# Patient Record
Sex: Female | Born: 1960
Health system: Southern US, Community
[De-identification: ages and names within clinical notes are randomized; demographics above are authoritative.]

## PROBLEM LIST (undated history)

## (undated) DIAGNOSIS — F419 Anxiety disorder, unspecified: Secondary | ICD-10-CM

## (undated) DIAGNOSIS — G8929 Other chronic pain: Secondary | ICD-10-CM

## (undated) DIAGNOSIS — E039 Hypothyroidism, unspecified: Secondary | ICD-10-CM

## (undated) DIAGNOSIS — K589 Irritable bowel syndrome without diarrhea: Secondary | ICD-10-CM

## (undated) DIAGNOSIS — A770 Spotted fever due to Rickettsia rickettsii: Secondary | ICD-10-CM

## (undated) DIAGNOSIS — E079 Disorder of thyroid, unspecified: Secondary | ICD-10-CM

## (undated) DIAGNOSIS — F329 Major depressive disorder, single episode, unspecified: Secondary | ICD-10-CM

## (undated) DIAGNOSIS — K649 Unspecified hemorrhoids: Secondary | ICD-10-CM

## (undated) DIAGNOSIS — E669 Obesity, unspecified: Secondary | ICD-10-CM

## (undated) DIAGNOSIS — M549 Dorsalgia, unspecified: Secondary | ICD-10-CM

## (undated) DIAGNOSIS — K5000 Crohn's disease of small intestine without complications: Secondary | ICD-10-CM

## (undated) DIAGNOSIS — M542 Cervicalgia: Secondary | ICD-10-CM

## (undated) DIAGNOSIS — K449 Diaphragmatic hernia without obstruction or gangrene: Secondary | ICD-10-CM

## (undated) DIAGNOSIS — K5792 Diverticulitis of intestine, part unspecified, without perforation or abscess without bleeding: Secondary | ICD-10-CM

## (undated) DIAGNOSIS — I10 Essential (primary) hypertension: Secondary | ICD-10-CM

## (undated) DIAGNOSIS — F32A Depression, unspecified: Secondary | ICD-10-CM

## (undated) DIAGNOSIS — K219 Gastro-esophageal reflux disease without esophagitis: Secondary | ICD-10-CM

## (undated) DIAGNOSIS — K635 Polyp of colon: Secondary | ICD-10-CM

## (undated) HISTORY — DX: Obesity, unspecified: E66.9

## (undated) HISTORY — PX: UMBILICAL HERNIA REPAIR: SHX196

## (undated) HISTORY — DX: Gastro-esophageal reflux disease without esophagitis: K21.9

## (undated) HISTORY — DX: Depression, unspecified: F32.A

## (undated) HISTORY — DX: Major depressive disorder, single episode, unspecified: F32.9

## (undated) HISTORY — DX: Anxiety disorder, unspecified: F41.9

## (undated) HISTORY — DX: Diaphragmatic hernia without obstruction or gangrene: K44.9

## (undated) HISTORY — DX: Crohn's disease of small intestine without complications: K50.00

## (undated) HISTORY — DX: Polyp of colon: K63.5

## (undated) HISTORY — DX: Diverticulitis of intestine, part unspecified, without perforation or abscess without bleeding: K57.92

## (undated) HISTORY — DX: Unspecified hemorrhoids: K64.9

## (undated) HISTORY — DX: Dorsalgia, unspecified: M54.9

## (undated) HISTORY — DX: Cervicalgia: M54.2

## (undated) HISTORY — PX: APPENDECTOMY: SHX54

## (undated) HISTORY — DX: Irritable bowel syndrome, unspecified: K58.9

## (undated) HISTORY — DX: Essential (primary) hypertension: I10

## (undated) HISTORY — DX: Other chronic pain: G89.29

---

## 2000-12-16 HISTORY — PX: OTHER SURGICAL HISTORY: SHX169

## 2001-09-18 ENCOUNTER — Encounter: Payer: Self-pay | Admitting: Internal Medicine

## 2001-09-18 ENCOUNTER — Ambulatory Visit (HOSPITAL_COMMUNITY): Admission: RE | Admit: 2001-09-18 | Discharge: 2001-09-18 | Payer: Self-pay | Admitting: Internal Medicine

## 2001-09-22 ENCOUNTER — Encounter: Payer: Self-pay | Admitting: Internal Medicine

## 2001-09-22 ENCOUNTER — Ambulatory Visit (HOSPITAL_COMMUNITY): Admission: RE | Admit: 2001-09-22 | Discharge: 2001-09-22 | Payer: Self-pay | Admitting: Internal Medicine

## 2001-09-25 ENCOUNTER — Ambulatory Visit (HOSPITAL_COMMUNITY): Admission: RE | Admit: 2001-09-25 | Discharge: 2001-09-25 | Payer: Self-pay | Admitting: Internal Medicine

## 2001-09-25 ENCOUNTER — Encounter: Payer: Self-pay | Admitting: Internal Medicine

## 2001-10-09 ENCOUNTER — Encounter: Payer: Self-pay | Admitting: Otolaryngology

## 2001-10-09 ENCOUNTER — Ambulatory Visit (HOSPITAL_COMMUNITY): Admission: RE | Admit: 2001-10-09 | Discharge: 2001-10-09 | Payer: Self-pay | Admitting: Otolaryngology

## 2001-10-28 ENCOUNTER — Encounter: Admission: RE | Admit: 2001-10-28 | Discharge: 2001-10-28 | Payer: Self-pay | Admitting: Otolaryngology

## 2001-10-28 ENCOUNTER — Encounter: Payer: Self-pay | Admitting: Otolaryngology

## 2001-11-02 ENCOUNTER — Ambulatory Visit (HOSPITAL_BASED_OUTPATIENT_CLINIC_OR_DEPARTMENT_OTHER): Admission: RE | Admit: 2001-11-02 | Discharge: 2001-11-02 | Payer: Self-pay | Admitting: Otolaryngology

## 2001-11-02 ENCOUNTER — Encounter (INDEPENDENT_AMBULATORY_CARE_PROVIDER_SITE_OTHER): Payer: Self-pay | Admitting: *Deleted

## 2001-12-28 ENCOUNTER — Ambulatory Visit (HOSPITAL_COMMUNITY): Admission: RE | Admit: 2001-12-28 | Discharge: 2001-12-28 | Payer: Self-pay | Admitting: Otolaryngology

## 2001-12-28 ENCOUNTER — Encounter: Payer: Self-pay | Admitting: Otolaryngology

## 2002-08-02 ENCOUNTER — Ambulatory Visit (HOSPITAL_COMMUNITY): Admission: RE | Admit: 2002-08-02 | Discharge: 2002-08-02 | Payer: Self-pay | Admitting: Family Medicine

## 2002-08-02 ENCOUNTER — Encounter: Payer: Self-pay | Admitting: Family Medicine

## 2002-12-16 HISTORY — PX: SUBTOTAL COLECTOMY: SHX855

## 2002-12-16 HISTORY — PX: LIVER BIOPSY: SHX301

## 2002-12-16 HISTORY — PX: CHOLECYSTECTOMY: SHX55

## 2003-04-11 ENCOUNTER — Ambulatory Visit (HOSPITAL_COMMUNITY): Admission: RE | Admit: 2003-04-11 | Discharge: 2003-04-11 | Payer: Self-pay | Admitting: General Surgery

## 2003-07-01 ENCOUNTER — Emergency Department (HOSPITAL_COMMUNITY): Admission: EM | Admit: 2003-07-01 | Discharge: 2003-07-01 | Payer: Self-pay | Admitting: Emergency Medicine

## 2003-07-01 ENCOUNTER — Encounter: Payer: Self-pay | Admitting: Emergency Medicine

## 2003-07-08 ENCOUNTER — Encounter (HOSPITAL_COMMUNITY): Admission: RE | Admit: 2003-07-08 | Discharge: 2003-08-07 | Payer: Self-pay | Admitting: Family Medicine

## 2003-07-08 ENCOUNTER — Encounter: Payer: Self-pay | Admitting: Family Medicine

## 2003-07-13 ENCOUNTER — Inpatient Hospital Stay (HOSPITAL_COMMUNITY): Admission: EM | Admit: 2003-07-13 | Discharge: 2003-08-02 | Payer: Self-pay | Admitting: Emergency Medicine

## 2003-07-13 ENCOUNTER — Encounter: Payer: Self-pay | Admitting: Emergency Medicine

## 2003-07-18 ENCOUNTER — Encounter: Payer: Self-pay | Admitting: General Surgery

## 2003-07-19 ENCOUNTER — Encounter: Payer: Self-pay | Admitting: General Surgery

## 2003-08-30 ENCOUNTER — Inpatient Hospital Stay (HOSPITAL_COMMUNITY): Admission: EM | Admit: 2003-08-30 | Discharge: 2003-09-23 | Payer: Self-pay | Admitting: Emergency Medicine

## 2003-08-30 ENCOUNTER — Encounter: Payer: Self-pay | Admitting: Emergency Medicine

## 2003-08-30 ENCOUNTER — Encounter: Payer: Self-pay | Admitting: General Surgery

## 2003-09-05 ENCOUNTER — Encounter: Payer: Self-pay | Admitting: General Surgery

## 2003-09-13 ENCOUNTER — Encounter: Payer: Self-pay | Admitting: General Surgery

## 2003-09-17 ENCOUNTER — Encounter: Payer: Self-pay | Admitting: General Surgery

## 2003-10-19 ENCOUNTER — Emergency Department (HOSPITAL_COMMUNITY): Admission: EM | Admit: 2003-10-19 | Discharge: 2003-10-19 | Payer: Self-pay | Admitting: Emergency Medicine

## 2003-10-24 ENCOUNTER — Ambulatory Visit (HOSPITAL_COMMUNITY): Admission: RE | Admit: 2003-10-24 | Discharge: 2003-10-24 | Payer: Self-pay | Admitting: General Surgery

## 2003-12-17 HISTORY — PX: COLONOSCOPY: SHX174

## 2004-01-03 ENCOUNTER — Ambulatory Visit (HOSPITAL_COMMUNITY): Admission: RE | Admit: 2004-01-03 | Discharge: 2004-01-03 | Payer: Self-pay | Admitting: General Surgery

## 2004-02-20 ENCOUNTER — Emergency Department (HOSPITAL_COMMUNITY): Admission: EM | Admit: 2004-02-20 | Discharge: 2004-02-20 | Payer: Self-pay | Admitting: Emergency Medicine

## 2004-02-29 ENCOUNTER — Inpatient Hospital Stay (HOSPITAL_COMMUNITY): Admission: AD | Admit: 2004-02-29 | Discharge: 2004-03-05 | Payer: Self-pay | Admitting: General Surgery

## 2004-07-26 ENCOUNTER — Ambulatory Visit (HOSPITAL_COMMUNITY): Admission: RE | Admit: 2004-07-26 | Discharge: 2004-07-26 | Payer: Self-pay | Admitting: General Surgery

## 2004-10-25 ENCOUNTER — Ambulatory Visit (HOSPITAL_COMMUNITY): Admission: RE | Admit: 2004-10-25 | Discharge: 2004-10-25 | Payer: Self-pay | Admitting: General Surgery

## 2004-11-15 ENCOUNTER — Ambulatory Visit (HOSPITAL_COMMUNITY): Admission: RE | Admit: 2004-11-15 | Discharge: 2004-11-15 | Payer: Self-pay | Admitting: General Surgery

## 2004-12-01 ENCOUNTER — Emergency Department (HOSPITAL_COMMUNITY): Admission: EM | Admit: 2004-12-01 | Discharge: 2004-12-01 | Payer: Self-pay | Admitting: Emergency Medicine

## 2004-12-02 ENCOUNTER — Emergency Department (HOSPITAL_COMMUNITY): Admission: EM | Admit: 2004-12-02 | Discharge: 2004-12-02 | Payer: Self-pay | Admitting: Emergency Medicine

## 2004-12-03 ENCOUNTER — Inpatient Hospital Stay (HOSPITAL_COMMUNITY): Admission: AD | Admit: 2004-12-03 | Discharge: 2004-12-06 | Payer: Self-pay | Admitting: General Surgery

## 2005-01-25 ENCOUNTER — Ambulatory Visit: Payer: Self-pay | Admitting: Psychiatry

## 2005-01-31 ENCOUNTER — Emergency Department (HOSPITAL_COMMUNITY): Admission: EM | Admit: 2005-01-31 | Discharge: 2005-01-31 | Payer: Self-pay | Admitting: Emergency Medicine

## 2005-02-15 ENCOUNTER — Ambulatory Visit (HOSPITAL_COMMUNITY): Admission: RE | Admit: 2005-02-15 | Discharge: 2005-02-15 | Payer: Self-pay | Admitting: General Surgery

## 2005-03-15 ENCOUNTER — Ambulatory Visit: Payer: Self-pay | Admitting: Psychiatry

## 2005-05-02 ENCOUNTER — Ambulatory Visit: Payer: Self-pay | Admitting: Psychiatry

## 2005-05-14 ENCOUNTER — Ambulatory Visit (HOSPITAL_COMMUNITY): Admission: RE | Admit: 2005-05-14 | Discharge: 2005-05-14 | Payer: Self-pay | Admitting: General Surgery

## 2005-06-03 ENCOUNTER — Emergency Department (HOSPITAL_COMMUNITY): Admission: EM | Admit: 2005-06-03 | Discharge: 2005-06-03 | Payer: Self-pay | Admitting: Emergency Medicine

## 2005-06-10 ENCOUNTER — Inpatient Hospital Stay (HOSPITAL_COMMUNITY): Admission: AD | Admit: 2005-06-10 | Discharge: 2005-06-13 | Payer: Self-pay | Admitting: General Surgery

## 2005-06-20 ENCOUNTER — Ambulatory Visit: Payer: Self-pay | Admitting: Psychiatry

## 2005-08-09 ENCOUNTER — Ambulatory Visit: Payer: Self-pay | Admitting: Psychiatry

## 2005-08-14 ENCOUNTER — Emergency Department (HOSPITAL_COMMUNITY): Admission: EM | Admit: 2005-08-14 | Discharge: 2005-08-14 | Payer: Self-pay | Admitting: Emergency Medicine

## 2005-08-18 ENCOUNTER — Emergency Department (HOSPITAL_COMMUNITY): Admission: EM | Admit: 2005-08-18 | Discharge: 2005-08-18 | Payer: Self-pay | Admitting: Emergency Medicine

## 2005-09-17 ENCOUNTER — Emergency Department (HOSPITAL_COMMUNITY): Admission: EM | Admit: 2005-09-17 | Discharge: 2005-09-18 | Payer: Self-pay | Admitting: Emergency Medicine

## 2005-09-19 ENCOUNTER — Ambulatory Visit (HOSPITAL_COMMUNITY): Admission: RE | Admit: 2005-09-19 | Discharge: 2005-09-19 | Payer: Self-pay | Admitting: Family Medicine

## 2005-10-21 ENCOUNTER — Observation Stay (HOSPITAL_COMMUNITY): Admission: AD | Admit: 2005-10-21 | Discharge: 2005-10-30 | Payer: Self-pay | Admitting: General Surgery

## 2005-10-21 ENCOUNTER — Ambulatory Visit (HOSPITAL_COMMUNITY): Admission: RE | Admit: 2005-10-21 | Discharge: 2005-10-21 | Payer: Self-pay | Admitting: General Surgery

## 2005-12-03 ENCOUNTER — Emergency Department (HOSPITAL_COMMUNITY): Admission: EM | Admit: 2005-12-03 | Discharge: 2005-12-03 | Payer: Self-pay | Admitting: Emergency Medicine

## 2005-12-13 ENCOUNTER — Inpatient Hospital Stay (HOSPITAL_COMMUNITY): Admission: AD | Admit: 2005-12-13 | Discharge: 2005-12-21 | Payer: Self-pay | Admitting: General Surgery

## 2005-12-27 ENCOUNTER — Emergency Department (HOSPITAL_COMMUNITY): Admission: EM | Admit: 2005-12-27 | Discharge: 2005-12-27 | Payer: Self-pay | Admitting: Emergency Medicine

## 2006-01-15 ENCOUNTER — Ambulatory Visit (HOSPITAL_COMMUNITY): Admission: RE | Admit: 2006-01-15 | Discharge: 2006-01-15 | Payer: Self-pay | Admitting: Family Medicine

## 2006-01-22 ENCOUNTER — Emergency Department (HOSPITAL_COMMUNITY): Admission: EM | Admit: 2006-01-22 | Discharge: 2006-01-22 | Payer: Self-pay | Admitting: Emergency Medicine

## 2006-01-28 ENCOUNTER — Ambulatory Visit (HOSPITAL_COMMUNITY): Admission: RE | Admit: 2006-01-28 | Discharge: 2006-01-28 | Payer: Self-pay | Admitting: Family Medicine

## 2006-01-29 ENCOUNTER — Emergency Department (HOSPITAL_COMMUNITY): Admission: EM | Admit: 2006-01-29 | Discharge: 2006-01-29 | Payer: Self-pay | Admitting: Emergency Medicine

## 2006-01-30 ENCOUNTER — Emergency Department (HOSPITAL_COMMUNITY): Admission: EM | Admit: 2006-01-30 | Discharge: 2006-01-30 | Payer: Self-pay | Admitting: Emergency Medicine

## 2006-01-31 ENCOUNTER — Emergency Department (HOSPITAL_COMMUNITY): Admission: EM | Admit: 2006-01-31 | Discharge: 2006-01-31 | Payer: Self-pay | Admitting: Emergency Medicine

## 2006-02-02 ENCOUNTER — Emergency Department (HOSPITAL_COMMUNITY): Admission: EM | Admit: 2006-02-02 | Discharge: 2006-02-03 | Payer: Self-pay | Admitting: Emergency Medicine

## 2006-03-13 ENCOUNTER — Emergency Department (HOSPITAL_COMMUNITY): Admission: EM | Admit: 2006-03-13 | Discharge: 2006-03-13 | Payer: Self-pay | Admitting: Emergency Medicine

## 2006-04-09 ENCOUNTER — Inpatient Hospital Stay (HOSPITAL_COMMUNITY): Admission: AD | Admit: 2006-04-09 | Discharge: 2006-04-13 | Payer: Self-pay | Admitting: General Surgery

## 2006-04-16 ENCOUNTER — Emergency Department (HOSPITAL_COMMUNITY): Admission: EM | Admit: 2006-04-16 | Discharge: 2006-04-16 | Payer: Self-pay | Admitting: Emergency Medicine

## 2006-04-18 ENCOUNTER — Emergency Department (HOSPITAL_COMMUNITY): Admission: EM | Admit: 2006-04-18 | Discharge: 2006-04-18 | Payer: Self-pay | Admitting: Emergency Medicine

## 2006-04-19 ENCOUNTER — Observation Stay (HOSPITAL_COMMUNITY): Admission: AD | Admit: 2006-04-19 | Discharge: 2006-04-21 | Payer: Self-pay | Admitting: General Surgery

## 2006-05-14 ENCOUNTER — Emergency Department (HOSPITAL_COMMUNITY): Admission: EM | Admit: 2006-05-14 | Discharge: 2006-05-14 | Payer: Self-pay | Admitting: Emergency Medicine

## 2006-05-22 ENCOUNTER — Emergency Department (HOSPITAL_COMMUNITY): Admission: EM | Admit: 2006-05-22 | Discharge: 2006-05-22 | Payer: Self-pay | Admitting: Emergency Medicine

## 2006-05-29 ENCOUNTER — Ambulatory Visit: Payer: Self-pay | Admitting: Orthopedic Surgery

## 2006-06-23 ENCOUNTER — Emergency Department (HOSPITAL_COMMUNITY): Admission: EM | Admit: 2006-06-23 | Discharge: 2006-06-23 | Payer: Self-pay | Admitting: Emergency Medicine

## 2006-07-02 ENCOUNTER — Ambulatory Visit (HOSPITAL_COMMUNITY): Payer: Self-pay | Admitting: Psychiatry

## 2006-07-09 ENCOUNTER — Emergency Department (HOSPITAL_COMMUNITY): Admission: EM | Admit: 2006-07-09 | Discharge: 2006-07-09 | Payer: Self-pay | Admitting: Emergency Medicine

## 2006-07-15 ENCOUNTER — Ambulatory Visit (HOSPITAL_COMMUNITY): Payer: Self-pay | Admitting: Psychiatry

## 2006-07-18 ENCOUNTER — Emergency Department (HOSPITAL_COMMUNITY): Admission: EM | Admit: 2006-07-18 | Discharge: 2006-07-18 | Payer: Self-pay | Admitting: Emergency Medicine

## 2006-07-22 ENCOUNTER — Emergency Department (HOSPITAL_COMMUNITY): Admission: EM | Admit: 2006-07-22 | Discharge: 2006-07-22 | Payer: Self-pay | Admitting: Emergency Medicine

## 2006-07-24 ENCOUNTER — Emergency Department (HOSPITAL_COMMUNITY): Admission: EM | Admit: 2006-07-24 | Discharge: 2006-07-24 | Payer: Self-pay | Admitting: Emergency Medicine

## 2006-07-28 ENCOUNTER — Ambulatory Visit (HOSPITAL_COMMUNITY): Payer: Self-pay | Admitting: Psychiatry

## 2006-08-12 ENCOUNTER — Ambulatory Visit (HOSPITAL_COMMUNITY): Payer: Self-pay | Admitting: Psychiatry

## 2006-08-26 ENCOUNTER — Ambulatory Visit (HOSPITAL_COMMUNITY): Payer: Self-pay | Admitting: Psychiatry

## 2006-09-15 ENCOUNTER — Ambulatory Visit (HOSPITAL_COMMUNITY): Payer: Self-pay | Admitting: Psychiatry

## 2006-10-09 ENCOUNTER — Ambulatory Visit (HOSPITAL_COMMUNITY): Payer: Self-pay | Admitting: Psychiatry

## 2006-10-10 ENCOUNTER — Ambulatory Visit (HOSPITAL_COMMUNITY): Payer: Self-pay | Admitting: Psychiatry

## 2006-11-10 ENCOUNTER — Ambulatory Visit (HOSPITAL_COMMUNITY): Payer: Self-pay | Admitting: Psychiatry

## 2007-01-27 ENCOUNTER — Ambulatory Visit (HOSPITAL_COMMUNITY): Payer: Self-pay | Admitting: Psychiatry

## 2007-02-05 ENCOUNTER — Emergency Department (HOSPITAL_COMMUNITY): Admission: EM | Admit: 2007-02-05 | Discharge: 2007-02-05 | Payer: Self-pay | Admitting: Emergency Medicine

## 2007-02-10 ENCOUNTER — Ambulatory Visit (HOSPITAL_COMMUNITY): Payer: Self-pay | Admitting: Psychiatry

## 2007-02-26 ENCOUNTER — Ambulatory Visit (HOSPITAL_COMMUNITY): Admission: RE | Admit: 2007-02-26 | Discharge: 2007-02-26 | Payer: Self-pay | Admitting: Neurology

## 2007-03-02 ENCOUNTER — Ambulatory Visit (HOSPITAL_COMMUNITY): Payer: Self-pay | Admitting: Psychiatry

## 2007-03-11 ENCOUNTER — Emergency Department (HOSPITAL_COMMUNITY): Admission: EM | Admit: 2007-03-11 | Discharge: 2007-03-11 | Payer: Self-pay | Admitting: Emergency Medicine

## 2007-04-17 ENCOUNTER — Ambulatory Visit (HOSPITAL_COMMUNITY): Payer: Self-pay | Admitting: Psychiatry

## 2007-05-01 ENCOUNTER — Ambulatory Visit (HOSPITAL_COMMUNITY): Payer: Self-pay | Admitting: Psychiatry

## 2007-06-27 ENCOUNTER — Emergency Department (HOSPITAL_COMMUNITY): Admission: EM | Admit: 2007-06-27 | Discharge: 2007-06-27 | Payer: Self-pay | Admitting: Emergency Medicine

## 2007-07-16 ENCOUNTER — Encounter (HOSPITAL_COMMUNITY)
Admission: RE | Admit: 2007-07-16 | Discharge: 2007-08-15 | Payer: Self-pay | Admitting: Physical Medicine and Rehabilitation

## 2007-08-13 ENCOUNTER — Ambulatory Visit (HOSPITAL_COMMUNITY): Payer: Self-pay | Admitting: Psychiatry

## 2007-08-22 ENCOUNTER — Emergency Department (HOSPITAL_COMMUNITY): Admission: EM | Admit: 2007-08-22 | Discharge: 2007-08-23 | Payer: Self-pay | Admitting: Emergency Medicine

## 2007-08-27 ENCOUNTER — Ambulatory Visit (HOSPITAL_COMMUNITY): Payer: Self-pay | Admitting: Psychiatry

## 2007-11-19 ENCOUNTER — Ambulatory Visit (HOSPITAL_COMMUNITY): Payer: Self-pay | Admitting: Psychiatry

## 2007-11-25 ENCOUNTER — Ambulatory Visit (HOSPITAL_COMMUNITY): Payer: Self-pay | Admitting: Psychiatry

## 2007-12-03 ENCOUNTER — Ambulatory Visit (HOSPITAL_COMMUNITY): Payer: Self-pay | Admitting: Psychiatry

## 2007-12-17 HISTORY — PX: OTHER SURGICAL HISTORY: SHX169

## 2007-12-18 ENCOUNTER — Ambulatory Visit (HOSPITAL_COMMUNITY): Payer: Self-pay | Admitting: Psychiatry

## 2008-01-01 ENCOUNTER — Ambulatory Visit (HOSPITAL_COMMUNITY): Payer: Self-pay | Admitting: Psychiatry

## 2008-02-25 ENCOUNTER — Ambulatory Visit (HOSPITAL_COMMUNITY): Payer: Self-pay | Admitting: Psychiatry

## 2008-03-10 ENCOUNTER — Ambulatory Visit (HOSPITAL_COMMUNITY): Payer: Self-pay | Admitting: Psychiatry

## 2008-03-24 ENCOUNTER — Ambulatory Visit (HOSPITAL_COMMUNITY): Payer: Self-pay | Admitting: Psychiatry

## 2008-04-06 ENCOUNTER — Ambulatory Visit (HOSPITAL_COMMUNITY): Payer: Self-pay | Admitting: Psychiatry

## 2008-04-26 ENCOUNTER — Ambulatory Visit (HOSPITAL_COMMUNITY): Payer: Self-pay | Admitting: Psychiatry

## 2008-05-02 ENCOUNTER — Inpatient Hospital Stay (HOSPITAL_COMMUNITY): Admission: RE | Admit: 2008-05-02 | Discharge: 2008-05-05 | Payer: Self-pay | Admitting: Obstetrics and Gynecology

## 2008-05-02 ENCOUNTER — Encounter: Payer: Self-pay | Admitting: Obstetrics and Gynecology

## 2008-08-08 ENCOUNTER — Ambulatory Visit (HOSPITAL_COMMUNITY): Payer: Self-pay | Admitting: Psychiatry

## 2008-08-10 ENCOUNTER — Emergency Department (HOSPITAL_COMMUNITY): Admission: EM | Admit: 2008-08-10 | Discharge: 2008-08-10 | Payer: Self-pay | Admitting: Emergency Medicine

## 2008-08-24 ENCOUNTER — Ambulatory Visit (HOSPITAL_COMMUNITY): Payer: Self-pay | Admitting: Psychiatry

## 2008-09-06 ENCOUNTER — Ambulatory Visit (HOSPITAL_COMMUNITY): Payer: Self-pay | Admitting: Psychiatry

## 2008-09-20 ENCOUNTER — Ambulatory Visit (HOSPITAL_COMMUNITY): Payer: Self-pay | Admitting: Psychiatry

## 2008-10-04 ENCOUNTER — Ambulatory Visit (HOSPITAL_COMMUNITY): Payer: Self-pay | Admitting: Psychiatry

## 2008-10-13 ENCOUNTER — Emergency Department (HOSPITAL_COMMUNITY): Admission: EM | Admit: 2008-10-13 | Discharge: 2008-10-13 | Payer: Self-pay | Admitting: Emergency Medicine

## 2008-10-15 ENCOUNTER — Emergency Department (HOSPITAL_COMMUNITY): Admission: EM | Admit: 2008-10-15 | Discharge: 2008-10-15 | Payer: Self-pay | Admitting: Emergency Medicine

## 2008-10-17 ENCOUNTER — Ambulatory Visit (HOSPITAL_COMMUNITY): Payer: Self-pay | Admitting: Psychiatry

## 2008-10-25 ENCOUNTER — Emergency Department (HOSPITAL_COMMUNITY): Admission: EM | Admit: 2008-10-25 | Discharge: 2008-10-26 | Payer: Self-pay | Admitting: Emergency Medicine

## 2008-10-31 ENCOUNTER — Ambulatory Visit (HOSPITAL_COMMUNITY): Payer: Self-pay | Admitting: Psychiatry

## 2008-11-08 ENCOUNTER — Encounter (HOSPITAL_COMMUNITY)
Admission: RE | Admit: 2008-11-08 | Discharge: 2008-12-08 | Payer: Self-pay | Admitting: Physical Medicine and Rehabilitation

## 2008-11-14 ENCOUNTER — Ambulatory Visit (HOSPITAL_COMMUNITY): Payer: Self-pay | Admitting: Psychiatry

## 2008-11-28 ENCOUNTER — Ambulatory Visit (HOSPITAL_COMMUNITY): Payer: Self-pay | Admitting: Psychiatry

## 2008-12-29 ENCOUNTER — Ambulatory Visit (HOSPITAL_COMMUNITY): Payer: Self-pay | Admitting: Psychiatry

## 2009-01-12 ENCOUNTER — Ambulatory Visit (HOSPITAL_COMMUNITY): Payer: Self-pay | Admitting: Psychiatry

## 2009-01-26 ENCOUNTER — Ambulatory Visit (HOSPITAL_COMMUNITY): Payer: Self-pay | Admitting: Psychiatry

## 2009-02-11 ENCOUNTER — Emergency Department (HOSPITAL_COMMUNITY): Admission: EM | Admit: 2009-02-11 | Discharge: 2009-02-11 | Payer: Self-pay | Admitting: Emergency Medicine

## 2009-02-13 HISTORY — PX: ESOPHAGOGASTRODUODENOSCOPY: SHX1529

## 2009-02-17 ENCOUNTER — Ambulatory Visit (HOSPITAL_COMMUNITY): Payer: Self-pay | Admitting: Psychiatry

## 2009-02-20 ENCOUNTER — Emergency Department (HOSPITAL_COMMUNITY): Admission: EM | Admit: 2009-02-20 | Discharge: 2009-02-20 | Payer: Self-pay | Admitting: Emergency Medicine

## 2009-02-21 ENCOUNTER — Inpatient Hospital Stay (HOSPITAL_COMMUNITY): Admission: EM | Admit: 2009-02-21 | Discharge: 2009-02-28 | Payer: Self-pay | Admitting: Emergency Medicine

## 2009-02-22 ENCOUNTER — Ambulatory Visit: Payer: Self-pay | Admitting: Internal Medicine

## 2009-02-23 ENCOUNTER — Ambulatory Visit: Payer: Self-pay | Admitting: Internal Medicine

## 2009-02-24 ENCOUNTER — Encounter: Payer: Self-pay | Admitting: Orthopedic Surgery

## 2009-02-25 ENCOUNTER — Ambulatory Visit: Payer: Self-pay | Admitting: Internal Medicine

## 2009-02-28 ENCOUNTER — Encounter: Payer: Self-pay | Admitting: Internal Medicine

## 2009-03-16 HISTORY — PX: COLONOSCOPY: SHX174

## 2009-03-20 ENCOUNTER — Encounter: Admission: RE | Admit: 2009-03-20 | Discharge: 2009-03-20 | Payer: Self-pay | Admitting: Neurology

## 2009-03-24 ENCOUNTER — Ambulatory Visit (HOSPITAL_COMMUNITY): Payer: Self-pay | Admitting: Psychiatry

## 2009-03-24 DIAGNOSIS — K648 Other hemorrhoids: Secondary | ICD-10-CM | POA: Insufficient documentation

## 2009-03-24 DIAGNOSIS — I10 Essential (primary) hypertension: Secondary | ICD-10-CM

## 2009-03-24 DIAGNOSIS — F411 Generalized anxiety disorder: Secondary | ICD-10-CM | POA: Insufficient documentation

## 2009-03-24 DIAGNOSIS — Z8719 Personal history of other diseases of the digestive system: Secondary | ICD-10-CM

## 2009-03-24 DIAGNOSIS — K219 Gastro-esophageal reflux disease without esophagitis: Secondary | ICD-10-CM | POA: Insufficient documentation

## 2009-03-24 DIAGNOSIS — F17200 Nicotine dependence, unspecified, uncomplicated: Secondary | ICD-10-CM

## 2009-03-24 DIAGNOSIS — F1010 Alcohol abuse, uncomplicated: Secondary | ICD-10-CM | POA: Insufficient documentation

## 2009-03-24 DIAGNOSIS — K222 Esophageal obstruction: Secondary | ICD-10-CM

## 2009-03-27 ENCOUNTER — Ambulatory Visit: Payer: Self-pay | Admitting: Internal Medicine

## 2009-03-27 DIAGNOSIS — K7689 Other specified diseases of liver: Secondary | ICD-10-CM

## 2009-03-27 DIAGNOSIS — D649 Anemia, unspecified: Secondary | ICD-10-CM

## 2009-03-27 DIAGNOSIS — R197 Diarrhea, unspecified: Secondary | ICD-10-CM

## 2009-03-27 DIAGNOSIS — R109 Unspecified abdominal pain: Secondary | ICD-10-CM | POA: Insufficient documentation

## 2009-03-27 DIAGNOSIS — K589 Irritable bowel syndrome without diarrhea: Secondary | ICD-10-CM

## 2009-03-27 DIAGNOSIS — M542 Cervicalgia: Secondary | ICD-10-CM

## 2009-03-28 ENCOUNTER — Encounter: Payer: Self-pay | Admitting: Gastroenterology

## 2009-03-29 ENCOUNTER — Ambulatory Visit: Payer: Self-pay | Admitting: Internal Medicine

## 2009-03-29 ENCOUNTER — Encounter: Payer: Self-pay | Admitting: Gastroenterology

## 2009-04-05 ENCOUNTER — Ambulatory Visit (HOSPITAL_COMMUNITY): Payer: Self-pay | Admitting: Psychiatry

## 2009-04-06 ENCOUNTER — Encounter: Payer: Self-pay | Admitting: Internal Medicine

## 2009-04-13 ENCOUNTER — Ambulatory Visit (HOSPITAL_COMMUNITY): Admission: RE | Admit: 2009-04-13 | Discharge: 2009-04-13 | Payer: Self-pay | Admitting: Internal Medicine

## 2009-04-13 ENCOUNTER — Encounter: Payer: Self-pay | Admitting: Internal Medicine

## 2009-04-13 ENCOUNTER — Ambulatory Visit: Payer: Self-pay | Admitting: Internal Medicine

## 2009-04-14 ENCOUNTER — Encounter: Payer: Self-pay | Admitting: Internal Medicine

## 2009-05-19 ENCOUNTER — Ambulatory Visit (HOSPITAL_COMMUNITY): Payer: Self-pay | Admitting: Psychiatry

## 2009-05-29 ENCOUNTER — Ambulatory Visit (HOSPITAL_COMMUNITY): Admission: RE | Admit: 2009-05-29 | Discharge: 2009-05-29 | Payer: Self-pay | Admitting: Internal Medicine

## 2009-06-02 ENCOUNTER — Ambulatory Visit (HOSPITAL_COMMUNITY): Payer: Self-pay | Admitting: Psychiatry

## 2009-06-13 ENCOUNTER — Ambulatory Visit: Payer: Self-pay | Admitting: Internal Medicine

## 2009-06-13 DIAGNOSIS — K5289 Other specified noninfective gastroenteritis and colitis: Secondary | ICD-10-CM

## 2009-06-14 ENCOUNTER — Encounter: Payer: Self-pay | Admitting: Internal Medicine

## 2009-06-16 ENCOUNTER — Encounter: Payer: Self-pay | Admitting: Internal Medicine

## 2009-06-20 ENCOUNTER — Encounter: Payer: Self-pay | Admitting: Internal Medicine

## 2009-06-20 ENCOUNTER — Encounter (INDEPENDENT_AMBULATORY_CARE_PROVIDER_SITE_OTHER): Payer: Self-pay

## 2009-06-20 LAB — CONVERTED CEMR LAB
Basophils Absolute: 0 10*3/uL (ref 0.0–0.1)
Basophils Relative: 0 % (ref 0–1)
Eosinophils Relative: 2 % (ref 0–5)
HCT: 40 % (ref 36.0–46.0)
Hemoglobin: 12.8 g/dL (ref 12.0–15.0)
Lymphocytes Relative: 46 % (ref 12–46)
Lymphs Abs: 3.9 10*3/uL (ref 0.7–4.0)
Monocytes Absolute: 0.5 10*3/uL (ref 0.1–1.0)
Monocytes Relative: 6 % (ref 3–12)
RBC: 4.34 M/uL (ref 3.87–5.11)
WBC: 8.4 10*3/uL (ref 4.0–10.5)

## 2009-06-22 ENCOUNTER — Ambulatory Visit (HOSPITAL_COMMUNITY): Payer: Self-pay | Admitting: Psychiatry

## 2009-06-27 ENCOUNTER — Encounter: Payer: Self-pay | Admitting: Gastroenterology

## 2009-07-03 ENCOUNTER — Ambulatory Visit: Payer: Self-pay | Admitting: Orthopedic Surgery

## 2009-07-03 DIAGNOSIS — M8700 Idiopathic aseptic necrosis of unspecified bone: Secondary | ICD-10-CM | POA: Insufficient documentation

## 2009-07-03 DIAGNOSIS — M5430 Sciatica, unspecified side: Secondary | ICD-10-CM

## 2009-07-06 ENCOUNTER — Encounter: Payer: Self-pay | Admitting: Orthopedic Surgery

## 2009-07-07 ENCOUNTER — Telehealth: Payer: Self-pay | Admitting: Orthopedic Surgery

## 2009-07-12 ENCOUNTER — Ambulatory Visit (HOSPITAL_COMMUNITY): Admission: RE | Admit: 2009-07-12 | Discharge: 2009-07-12 | Payer: Self-pay | Admitting: Orthopedic Surgery

## 2009-07-14 ENCOUNTER — Ambulatory Visit (HOSPITAL_COMMUNITY): Payer: Self-pay | Admitting: Psychiatry

## 2009-07-24 ENCOUNTER — Ambulatory Visit: Payer: Self-pay | Admitting: Orthopedic Surgery

## 2009-07-24 DIAGNOSIS — M4800 Spinal stenosis, site unspecified: Secondary | ICD-10-CM

## 2009-07-26 ENCOUNTER — Telehealth: Payer: Self-pay | Admitting: Orthopedic Surgery

## 2009-07-26 ENCOUNTER — Ambulatory Visit: Payer: Self-pay | Admitting: Internal Medicine

## 2009-07-28 ENCOUNTER — Ambulatory Visit (HOSPITAL_COMMUNITY): Admission: RE | Admit: 2009-07-28 | Discharge: 2009-07-28 | Payer: Self-pay | Admitting: Orthopedic Surgery

## 2009-07-28 ENCOUNTER — Encounter: Payer: Self-pay | Admitting: Internal Medicine

## 2009-08-03 ENCOUNTER — Ambulatory Visit: Payer: Self-pay | Admitting: Orthopedic Surgery

## 2009-08-03 DIAGNOSIS — M5137 Other intervertebral disc degeneration, lumbosacral region: Secondary | ICD-10-CM

## 2009-08-04 ENCOUNTER — Ambulatory Visit (HOSPITAL_COMMUNITY): Payer: Self-pay | Admitting: Psychiatry

## 2009-08-10 ENCOUNTER — Encounter (HOSPITAL_COMMUNITY): Admission: RE | Admit: 2009-08-10 | Discharge: 2009-09-09 | Payer: Self-pay | Admitting: Orthopedic Surgery

## 2009-08-10 ENCOUNTER — Encounter: Payer: Self-pay | Admitting: Orthopedic Surgery

## 2009-09-01 ENCOUNTER — Ambulatory Visit (HOSPITAL_COMMUNITY): Payer: Self-pay | Admitting: Psychiatry

## 2009-09-13 ENCOUNTER — Ambulatory Visit: Payer: Self-pay | Admitting: Internal Medicine

## 2009-10-26 ENCOUNTER — Telehealth: Payer: Self-pay | Admitting: Orthopedic Surgery

## 2009-10-26 ENCOUNTER — Ambulatory Visit: Payer: Self-pay | Admitting: Orthopedic Surgery

## 2009-10-27 ENCOUNTER — Encounter: Payer: Self-pay | Admitting: Orthopedic Surgery

## 2009-11-01 ENCOUNTER — Ambulatory Visit (HOSPITAL_COMMUNITY): Payer: Self-pay | Admitting: Psychiatry

## 2009-11-08 ENCOUNTER — Telehealth (INDEPENDENT_AMBULATORY_CARE_PROVIDER_SITE_OTHER): Payer: Self-pay | Admitting: *Deleted

## 2009-11-15 HISTORY — PX: GIVENS CAPSULE STUDY: SHX5432

## 2009-11-17 ENCOUNTER — Ambulatory Visit: Payer: Self-pay | Admitting: Internal Medicine

## 2009-11-19 IMAGING — CR DG CHEST 1V PORT
1 series · 1 of 1 positions shown · non-contrast
Comparison: 02/20/2009

CLINICAL DATA: Nausea, vomiting and abdominal pain.

PORTABLE CHEST - 1 VIEW

[view not recorded]
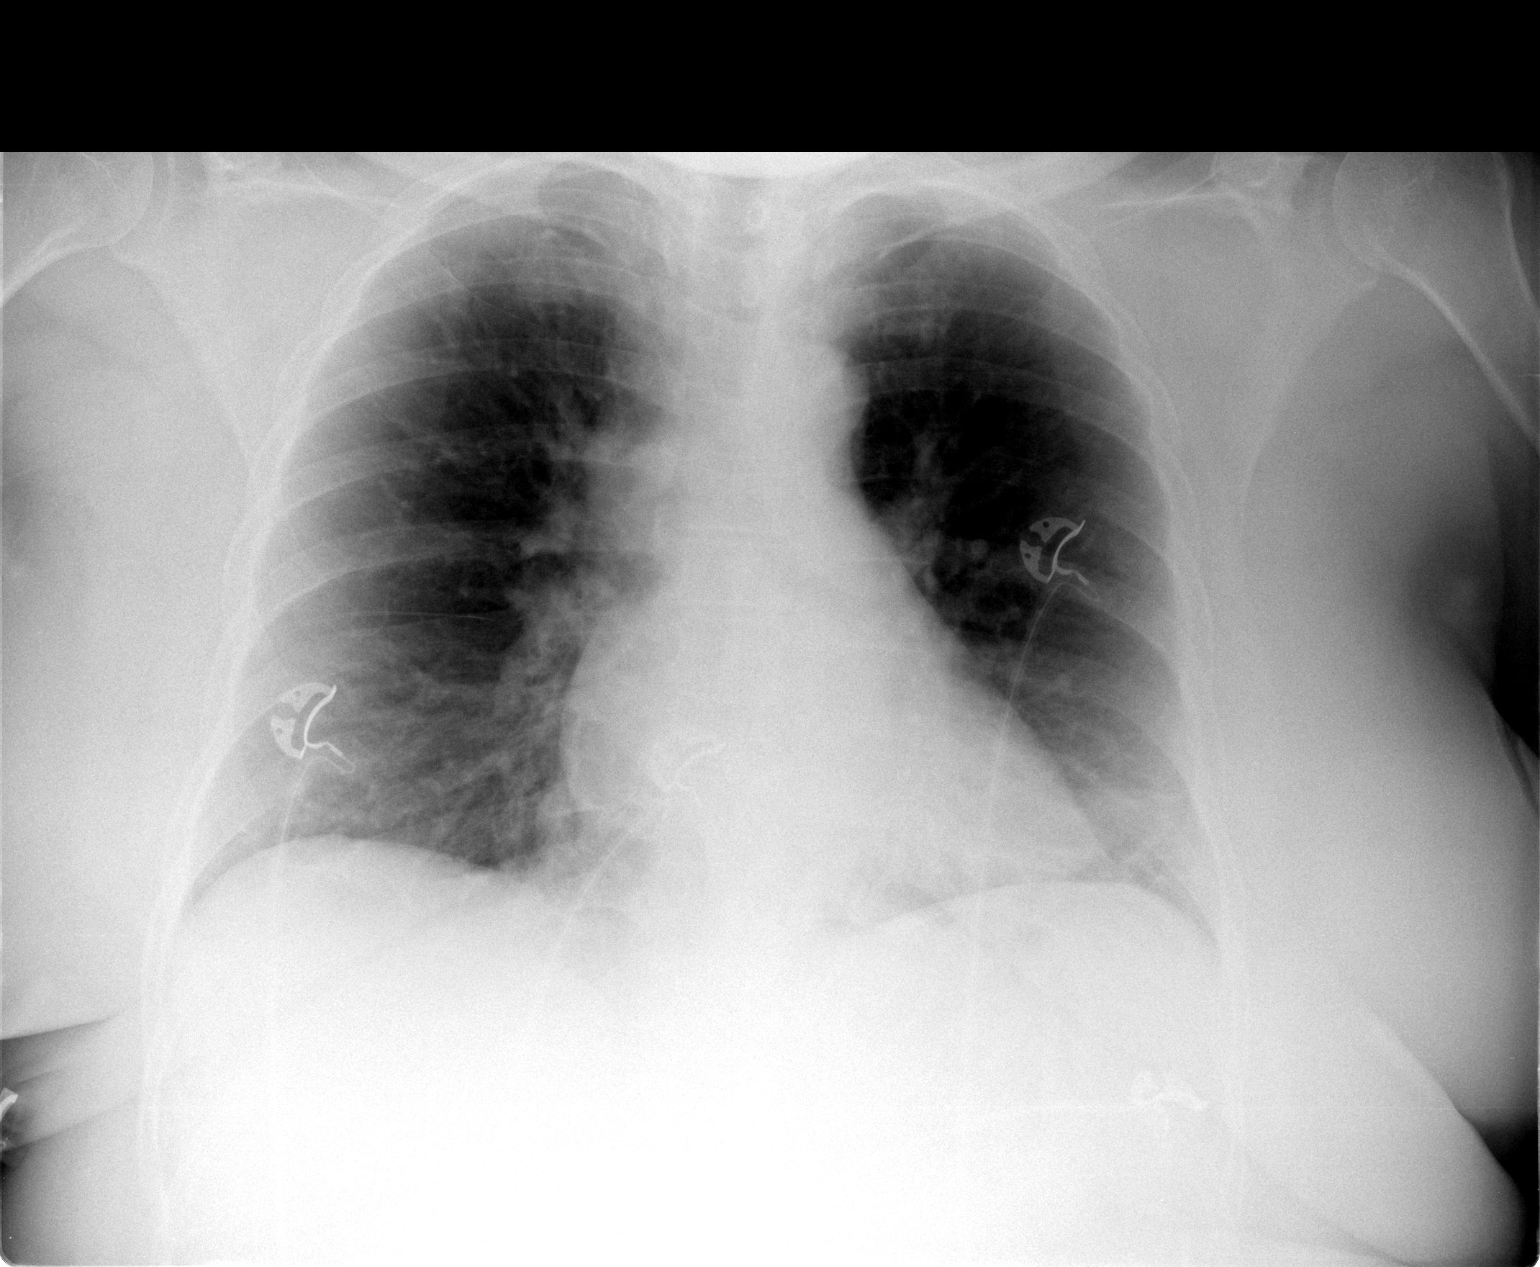

[1 of 1 positions shown; findings below may reference images not displayed]

FINDINGS: Trachea is midline.  Heart size normal.  Lingular air
space disease has nearly completely resolved in the interval.
Lungs are low in volume.  No pleural fluid.
IMPRESSION: Near complete resolution of lingular air space disease.

## 2009-11-21 IMAGING — CR DG CHEST 2V
2 series · 2 of 2 positions shown · non-contrast
Comparison: Chest radiograph 02/22/2009, 02/20/2009

CLINICAL DATA: Chest pain, follow up pneumonia

CHEST - 2 VIEW

[view not recorded (1 of 2)]
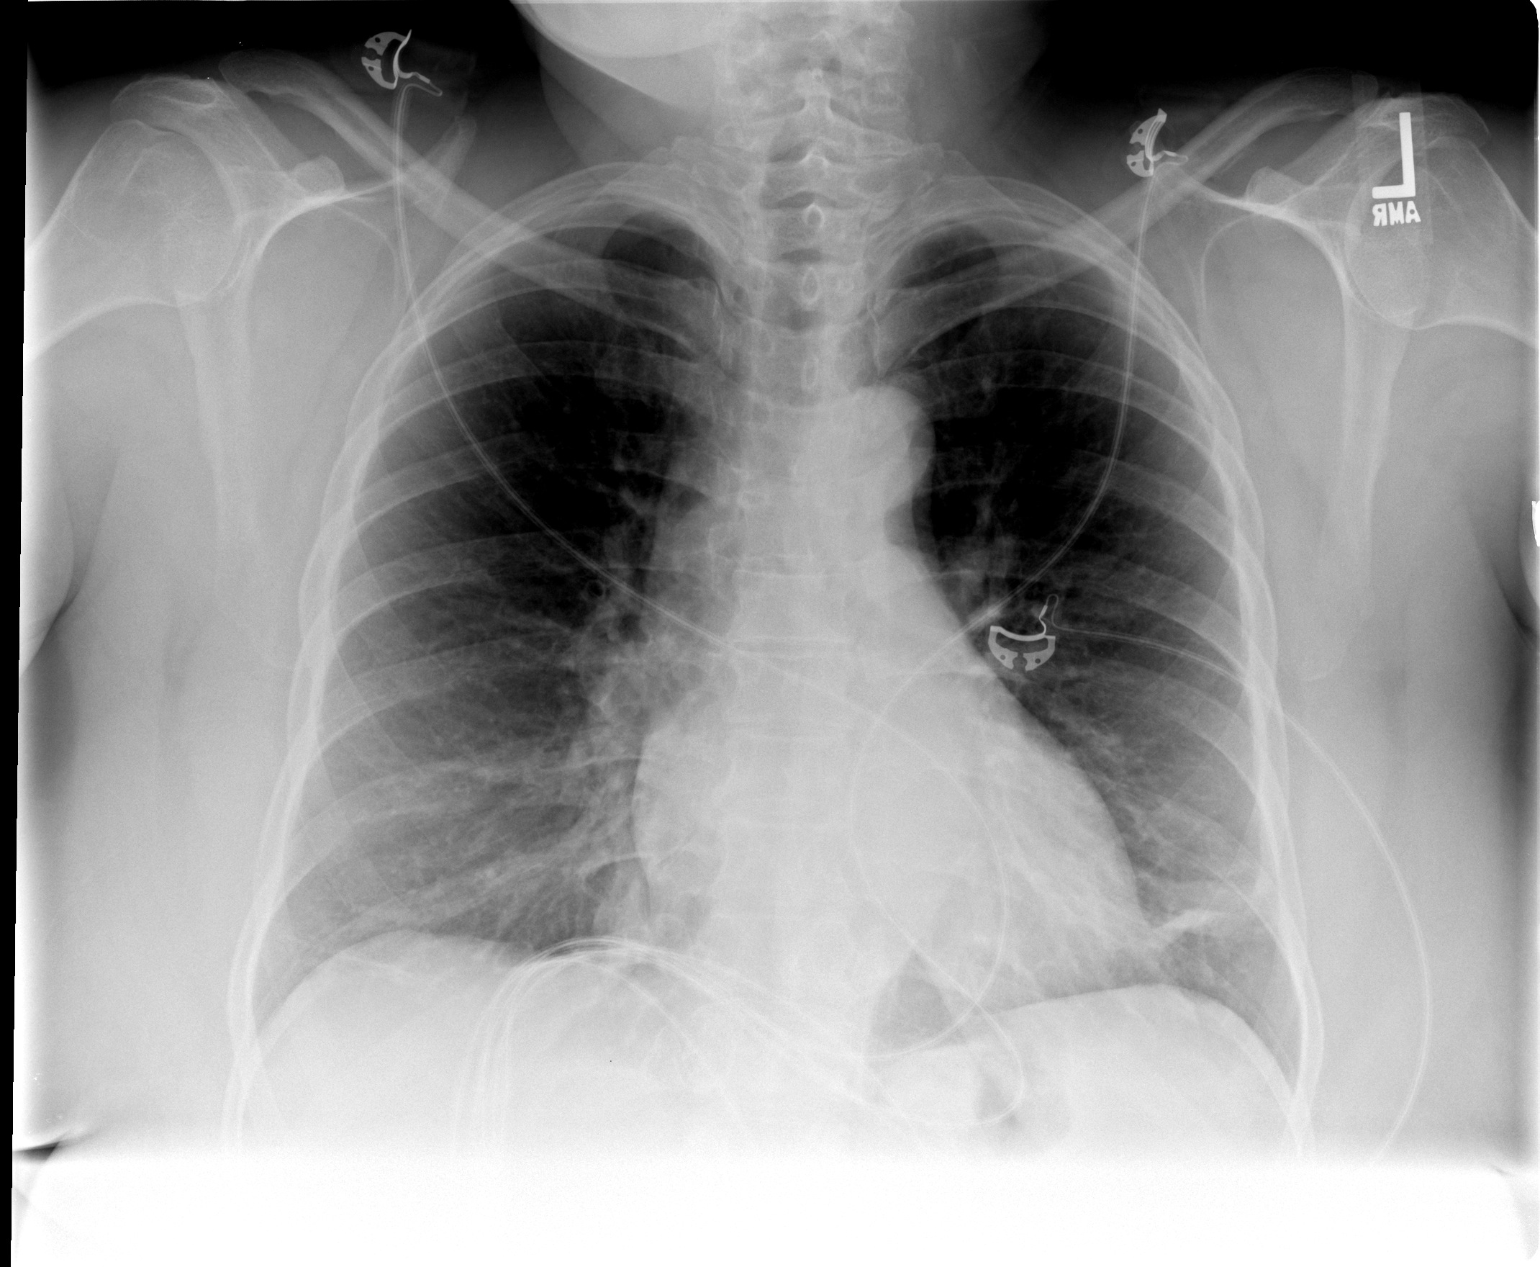

[view not recorded (2 of 2)]
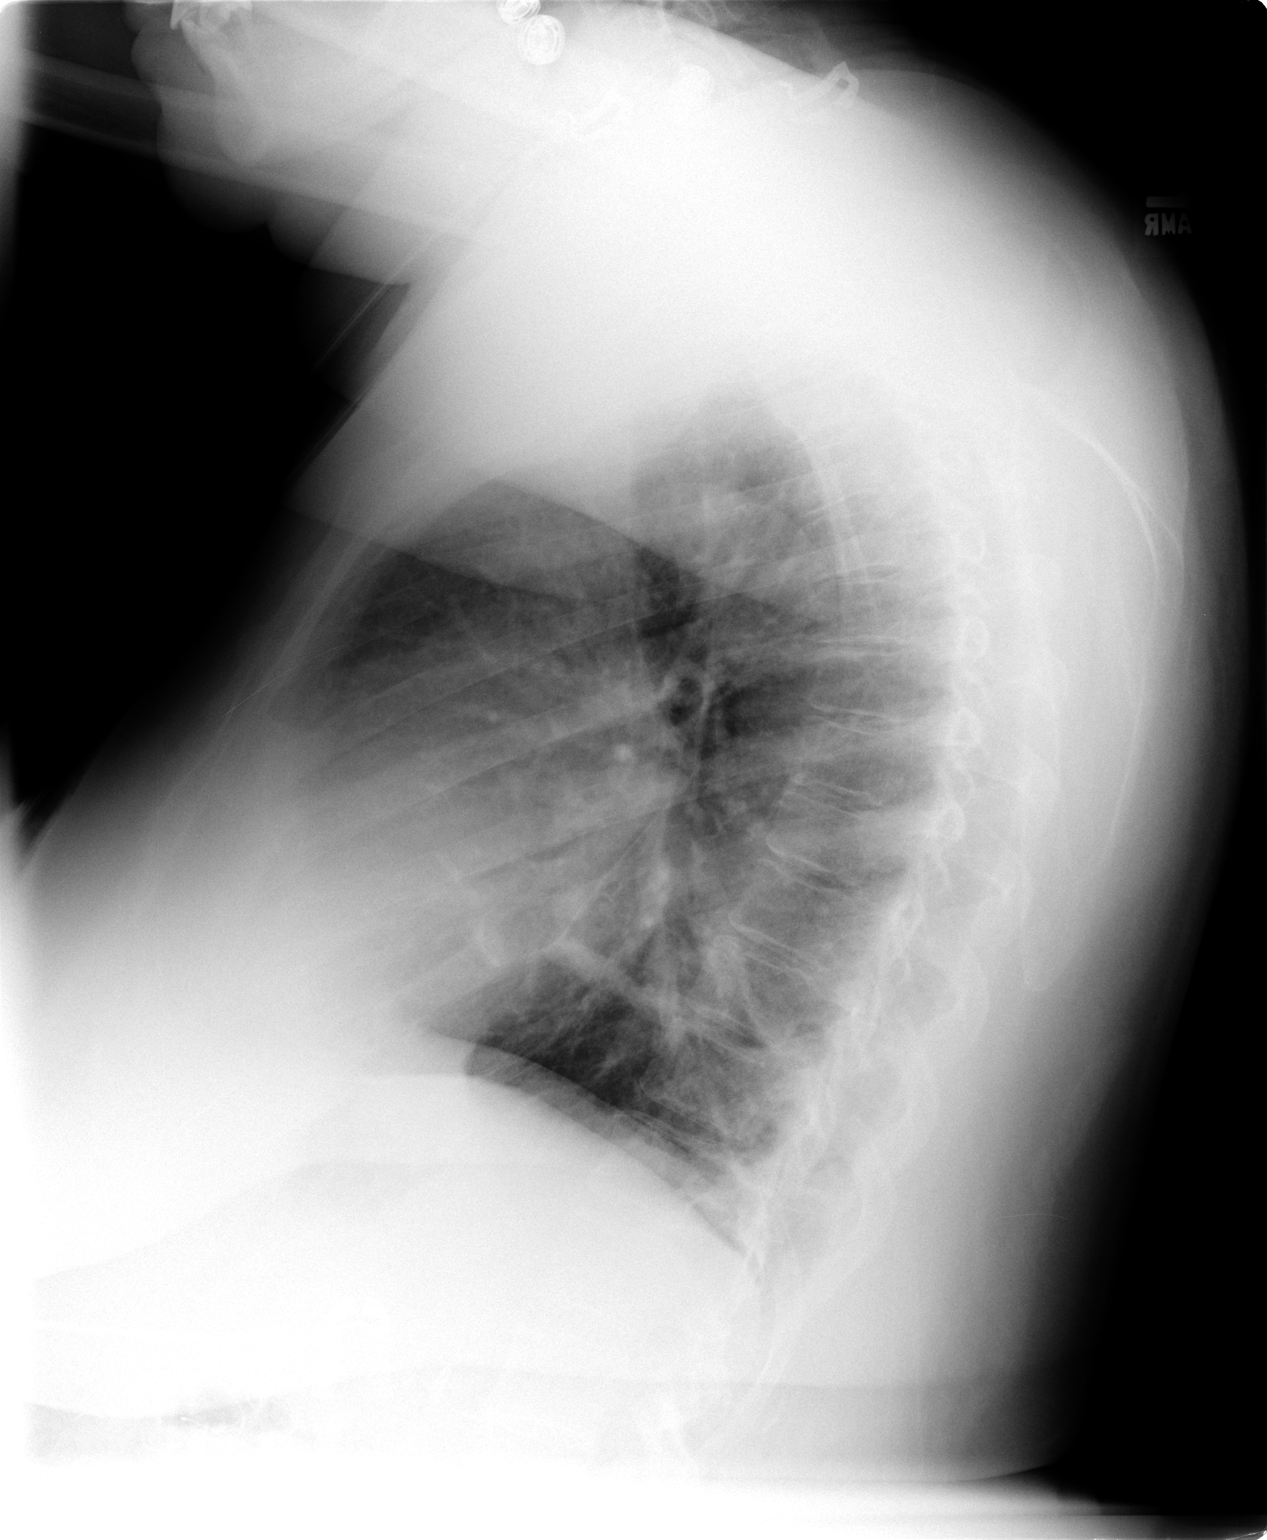

[2 of 2 positions shown; findings below may reference images not displayed]

FINDINGS: Normal mediastinum and cardiac silhouette.  There is a
band-like density in the lingula which is increased from prior.
Faint air space disease in the lingula persists.  No evidence
pneumothorax.  No pulmonary edema.
IMPRESSION: 1. New subsegmental atelectasis in the lingula.
2.  Faint residual lingular air space disease.

## 2009-11-22 ENCOUNTER — Encounter: Payer: Self-pay | Admitting: Internal Medicine

## 2009-11-22 ENCOUNTER — Ambulatory Visit (HOSPITAL_COMMUNITY): Payer: Self-pay | Admitting: Psychiatry

## 2009-11-23 DIAGNOSIS — K922 Gastrointestinal hemorrhage, unspecified: Secondary | ICD-10-CM | POA: Insufficient documentation

## 2009-11-24 ENCOUNTER — Ambulatory Visit (HOSPITAL_COMMUNITY): Admission: RE | Admit: 2009-11-24 | Discharge: 2009-11-24 | Payer: Self-pay | Admitting: Internal Medicine

## 2009-11-24 ENCOUNTER — Encounter: Payer: Self-pay | Admitting: Internal Medicine

## 2009-11-25 ENCOUNTER — Emergency Department (HOSPITAL_COMMUNITY): Admission: EM | Admit: 2009-11-25 | Discharge: 2009-11-25 | Payer: Self-pay | Admitting: Emergency Medicine

## 2009-11-25 ENCOUNTER — Ambulatory Visit (HOSPITAL_COMMUNITY): Admission: RE | Admit: 2009-11-25 | Discharge: 2009-11-25 | Payer: Self-pay | Admitting: Internal Medicine

## 2009-11-26 ENCOUNTER — Ambulatory Visit (HOSPITAL_COMMUNITY): Admission: RE | Admit: 2009-11-26 | Discharge: 2009-11-26 | Payer: Self-pay | Admitting: Emergency Medicine

## 2009-11-27 ENCOUNTER — Ambulatory Visit (HOSPITAL_COMMUNITY): Admission: RE | Admit: 2009-11-27 | Discharge: 2009-11-27 | Payer: Self-pay | Admitting: Internal Medicine

## 2009-11-27 ENCOUNTER — Telehealth: Payer: Self-pay | Admitting: Orthopedic Surgery

## 2009-11-27 LAB — CONVERTED CEMR LAB
Basophils Absolute: 0 10*3/uL (ref 0.0–0.1)
Eosinophils Relative: 2 % (ref 0–5)
HCT: 38.3 % (ref 36.0–46.0)
Lymphocytes Relative: 48 % — ABNORMAL HIGH (ref 12–46)
Neutrophils Relative %: 46 % (ref 43–77)
Platelets: 244 10*3/uL (ref 150–400)
RDW: 16.4 % — ABNORMAL HIGH (ref 11.5–15.5)
WBC: 8.1 10*3/uL (ref 4.0–10.5)

## 2009-11-29 ENCOUNTER — Ambulatory Visit: Payer: Self-pay | Admitting: Internal Medicine

## 2009-12-05 ENCOUNTER — Encounter: Payer: Self-pay | Admitting: Internal Medicine

## 2009-12-05 ENCOUNTER — Encounter (INDEPENDENT_AMBULATORY_CARE_PROVIDER_SITE_OTHER): Payer: Self-pay | Admitting: *Deleted

## 2009-12-16 HISTORY — PX: COLONOSCOPY: SHX174

## 2009-12-18 ENCOUNTER — Encounter: Payer: Self-pay | Admitting: Orthopedic Surgery

## 2009-12-21 ENCOUNTER — Encounter: Payer: Self-pay | Admitting: Orthopedic Surgery

## 2009-12-28 ENCOUNTER — Encounter: Payer: Self-pay | Admitting: Orthopedic Surgery

## 2010-01-09 ENCOUNTER — Emergency Department (HOSPITAL_COMMUNITY): Admission: EM | Admit: 2010-01-09 | Discharge: 2010-01-09 | Payer: Self-pay | Admitting: Emergency Medicine

## 2010-01-24 ENCOUNTER — Ambulatory Visit (HOSPITAL_COMMUNITY): Payer: Self-pay | Admitting: Psychiatry

## 2010-02-07 ENCOUNTER — Ambulatory Visit (HOSPITAL_COMMUNITY): Payer: Self-pay | Admitting: Psychiatry

## 2010-02-20 ENCOUNTER — Ambulatory Visit (HOSPITAL_COMMUNITY): Payer: Self-pay | Admitting: Psychiatry

## 2010-02-23 ENCOUNTER — Emergency Department (HOSPITAL_COMMUNITY): Admission: EM | Admit: 2010-02-23 | Discharge: 2010-02-23 | Payer: Self-pay | Admitting: Emergency Medicine

## 2010-02-26 ENCOUNTER — Ambulatory Visit: Payer: Self-pay | Admitting: Internal Medicine

## 2010-02-28 ENCOUNTER — Ambulatory Visit: Payer: Self-pay | Admitting: Orthopedic Surgery

## 2010-03-01 ENCOUNTER — Encounter: Payer: Self-pay | Admitting: Internal Medicine

## 2010-03-01 ENCOUNTER — Encounter (INDEPENDENT_AMBULATORY_CARE_PROVIDER_SITE_OTHER): Payer: Self-pay | Admitting: *Deleted

## 2010-03-01 ENCOUNTER — Telehealth (INDEPENDENT_AMBULATORY_CARE_PROVIDER_SITE_OTHER): Payer: Self-pay | Admitting: *Deleted

## 2010-03-02 ENCOUNTER — Ambulatory Visit: Payer: Self-pay | Admitting: Cardiology

## 2010-03-02 ENCOUNTER — Encounter (INDEPENDENT_AMBULATORY_CARE_PROVIDER_SITE_OTHER): Payer: Self-pay | Admitting: *Deleted

## 2010-03-02 DIAGNOSIS — R0602 Shortness of breath: Secondary | ICD-10-CM

## 2010-03-02 DIAGNOSIS — E785 Hyperlipidemia, unspecified: Secondary | ICD-10-CM

## 2010-03-05 ENCOUNTER — Encounter: Payer: Self-pay | Admitting: Internal Medicine

## 2010-03-06 ENCOUNTER — Telehealth: Payer: Self-pay | Admitting: Orthopedic Surgery

## 2010-03-06 ENCOUNTER — Encounter: Payer: Self-pay | Admitting: Internal Medicine

## 2010-03-13 ENCOUNTER — Ambulatory Visit (HOSPITAL_COMMUNITY): Payer: Self-pay | Admitting: Psychiatry

## 2010-03-13 ENCOUNTER — Ambulatory Visit (HOSPITAL_COMMUNITY): Admission: RE | Admit: 2010-03-13 | Discharge: 2010-03-13 | Payer: Self-pay | Admitting: Cardiology

## 2010-03-15 ENCOUNTER — Encounter (INDEPENDENT_AMBULATORY_CARE_PROVIDER_SITE_OTHER): Payer: Self-pay | Admitting: *Deleted

## 2010-03-20 ENCOUNTER — Encounter: Payer: Self-pay | Admitting: Internal Medicine

## 2010-03-22 ENCOUNTER — Ambulatory Visit (HOSPITAL_COMMUNITY): Admission: RE | Admit: 2010-03-22 | Discharge: 2010-03-22 | Payer: Self-pay | Admitting: Cardiology

## 2010-03-22 ENCOUNTER — Encounter: Payer: Self-pay | Admitting: Cardiology

## 2010-03-22 ENCOUNTER — Ambulatory Visit: Payer: Self-pay | Admitting: Cardiology

## 2010-03-27 ENCOUNTER — Encounter (INDEPENDENT_AMBULATORY_CARE_PROVIDER_SITE_OTHER): Payer: Self-pay | Admitting: *Deleted

## 2010-03-31 ENCOUNTER — Emergency Department (HOSPITAL_COMMUNITY): Admission: EM | Admit: 2010-03-31 | Discharge: 2010-03-31 | Payer: Self-pay | Admitting: Emergency Medicine

## 2010-04-04 ENCOUNTER — Ambulatory Visit (HOSPITAL_COMMUNITY): Payer: Self-pay | Admitting: Psychiatry

## 2010-04-25 ENCOUNTER — Ambulatory Visit (HOSPITAL_COMMUNITY): Payer: Self-pay | Admitting: Psychiatry

## 2010-05-02 ENCOUNTER — Encounter: Payer: Self-pay | Admitting: Internal Medicine

## 2010-05-16 ENCOUNTER — Ambulatory Visit (HOSPITAL_COMMUNITY): Payer: Self-pay | Admitting: Psychiatry

## 2010-05-18 ENCOUNTER — Encounter: Payer: Self-pay | Admitting: Internal Medicine

## 2010-05-22 ENCOUNTER — Encounter (INDEPENDENT_AMBULATORY_CARE_PROVIDER_SITE_OTHER): Payer: Self-pay

## 2010-05-22 ENCOUNTER — Ambulatory Visit: Payer: Self-pay | Admitting: Internal Medicine

## 2010-05-31 ENCOUNTER — Ambulatory Visit (HOSPITAL_COMMUNITY): Admission: RE | Admit: 2010-05-31 | Discharge: 2010-05-31 | Payer: Self-pay | Admitting: Internal Medicine

## 2010-06-06 ENCOUNTER — Ambulatory Visit (HOSPITAL_COMMUNITY): Payer: Self-pay | Admitting: Psychiatry

## 2010-07-04 ENCOUNTER — Ambulatory Visit: Payer: Self-pay | Admitting: Internal Medicine

## 2010-07-04 DIAGNOSIS — R112 Nausea with vomiting, unspecified: Secondary | ICD-10-CM

## 2010-07-06 ENCOUNTER — Telehealth (INDEPENDENT_AMBULATORY_CARE_PROVIDER_SITE_OTHER): Payer: Self-pay

## 2010-07-06 ENCOUNTER — Ambulatory Visit (HOSPITAL_COMMUNITY): Payer: Self-pay | Admitting: Psychiatry

## 2010-07-06 LAB — CONVERTED CEMR LAB
Basophils Relative: 0 % (ref 0–1)
Eosinophils Absolute: 0.1 10*3/uL (ref 0.0–0.7)
Lipase: 17 units/L (ref 0–75)
Lymphs Abs: 2.9 10*3/uL (ref 0.7–4.0)
MCHC: 32.4 g/dL (ref 30.0–36.0)
MCV: 94.6 fL (ref 78.0–100.0)
Neutro Abs: 4.1 10*3/uL (ref 1.7–7.7)
Neutrophils Relative %: 55 % (ref 43–77)
Platelets: 265 10*3/uL (ref 150–400)
Potassium: 4.7 meq/L (ref 3.5–5.3)
Sodium: 140 meq/L (ref 135–145)
WBC: 7.4 10*3/uL (ref 4.0–10.5)

## 2010-07-12 ENCOUNTER — Encounter: Payer: Self-pay | Admitting: Gastroenterology

## 2010-07-13 ENCOUNTER — Ambulatory Visit (HOSPITAL_COMMUNITY): Admission: RE | Admit: 2010-07-13 | Discharge: 2010-07-13 | Payer: Self-pay | Admitting: Gastroenterology

## 2010-07-16 HISTORY — PX: ESOPHAGOGASTRODUODENOSCOPY: SHX1529

## 2010-07-16 LAB — CONVERTED CEMR LAB
CO2: 20 meq/L (ref 19–32)
Calcium: 9.6 mg/dL (ref 8.4–10.5)
Creatinine, Ser: 1.24 mg/dL — ABNORMAL HIGH (ref 0.40–1.20)
Eosinophils Relative: 2 % (ref 0–5)
Glucose, Bld: 112 mg/dL — ABNORMAL HIGH (ref 70–99)
HCT: 39.5 % (ref 36.0–46.0)
Hemoglobin: 13.3 g/dL (ref 12.0–15.0)
Lymphocytes Relative: 53 % — ABNORMAL HIGH (ref 12–46)
Lymphs Abs: 3.9 10*3/uL (ref 0.7–4.0)
Monocytes Absolute: 0.4 10*3/uL (ref 0.1–1.0)
Monocytes Relative: 5 % (ref 3–12)
WBC: 7.4 10*3/uL (ref 4.0–10.5)

## 2010-07-18 ENCOUNTER — Ambulatory Visit: Payer: Self-pay | Admitting: Internal Medicine

## 2010-07-18 DIAGNOSIS — R1310 Dysphagia, unspecified: Secondary | ICD-10-CM | POA: Insufficient documentation

## 2010-07-19 ENCOUNTER — Encounter: Payer: Self-pay | Admitting: Internal Medicine

## 2010-07-24 ENCOUNTER — Ambulatory Visit (HOSPITAL_COMMUNITY): Payer: Self-pay | Admitting: Psychiatry

## 2010-07-26 ENCOUNTER — Ambulatory Visit: Payer: Self-pay | Admitting: Internal Medicine

## 2010-07-26 ENCOUNTER — Ambulatory Visit (HOSPITAL_COMMUNITY): Admission: RE | Admit: 2010-07-26 | Discharge: 2010-07-26 | Payer: Self-pay | Admitting: Internal Medicine

## 2010-08-13 ENCOUNTER — Telehealth (INDEPENDENT_AMBULATORY_CARE_PROVIDER_SITE_OTHER): Payer: Self-pay

## 2010-08-14 ENCOUNTER — Encounter: Payer: Self-pay | Admitting: Internal Medicine

## 2010-08-15 ENCOUNTER — Emergency Department (HOSPITAL_COMMUNITY): Admission: EM | Admit: 2010-08-15 | Discharge: 2010-08-15 | Payer: Self-pay | Admitting: Emergency Medicine

## 2010-08-17 ENCOUNTER — Encounter (HOSPITAL_COMMUNITY): Admission: RE | Admit: 2010-08-17 | Discharge: 2010-08-17 | Payer: Self-pay | Admitting: Internal Medicine

## 2010-08-20 ENCOUNTER — Emergency Department (HOSPITAL_COMMUNITY): Admission: EM | Admit: 2010-08-20 | Discharge: 2010-08-20 | Payer: Self-pay | Admitting: Emergency Medicine

## 2010-08-22 ENCOUNTER — Encounter: Payer: Self-pay | Admitting: Gastroenterology

## 2010-08-27 ENCOUNTER — Emergency Department (HOSPITAL_COMMUNITY)
Admission: EM | Admit: 2010-08-27 | Discharge: 2010-08-27 | Payer: Self-pay | Source: Home / Self Care | Admitting: Emergency Medicine

## 2010-09-13 ENCOUNTER — Encounter: Payer: Self-pay | Admitting: Internal Medicine

## 2010-10-02 ENCOUNTER — Encounter (INDEPENDENT_AMBULATORY_CARE_PROVIDER_SITE_OTHER): Payer: Self-pay | Admitting: *Deleted

## 2010-10-15 ENCOUNTER — Ambulatory Visit: Payer: Self-pay | Admitting: Internal Medicine

## 2010-11-13 ENCOUNTER — Encounter: Payer: Self-pay | Admitting: Gastroenterology

## 2010-11-28 ENCOUNTER — Ambulatory Visit: Payer: Self-pay | Admitting: Internal Medicine

## 2010-11-28 LAB — CONVERTED CEMR LAB
CO2: 22 meq/L (ref 19–32)
Calcium: 9.4 mg/dL (ref 8.4–10.5)
Creatinine, Ser: 0.95 mg/dL (ref 0.40–1.20)

## 2010-11-29 ENCOUNTER — Encounter: Payer: Self-pay | Admitting: Internal Medicine

## 2010-12-03 LAB — CONVERTED CEMR LAB
Bacteria, UA: NONE SEEN
Crystals: NONE SEEN
Ketones, ur: NEGATIVE mg/dL
Nitrite: NEGATIVE
Specific Gravity, Urine: 1.014 (ref 1.005–1.030)
Urobilinogen, UA: 0.2 (ref 0.0–1.0)

## 2010-12-04 ENCOUNTER — Telehealth (INDEPENDENT_AMBULATORY_CARE_PROVIDER_SITE_OTHER): Payer: Self-pay

## 2010-12-04 ENCOUNTER — Ambulatory Visit (HOSPITAL_COMMUNITY)
Admission: RE | Admit: 2010-12-04 | Discharge: 2010-12-04 | Payer: Self-pay | Source: Home / Self Care | Attending: Internal Medicine | Admitting: Internal Medicine

## 2010-12-08 IMAGING — CR DG CHEST 2V
2 series · 2 of 2 positions shown · non-contrast
Comparison: 02/23/2010

CLINICAL DATA: Dyspnea

CHEST - 2 VIEW

[view not recorded (1 of 2)]
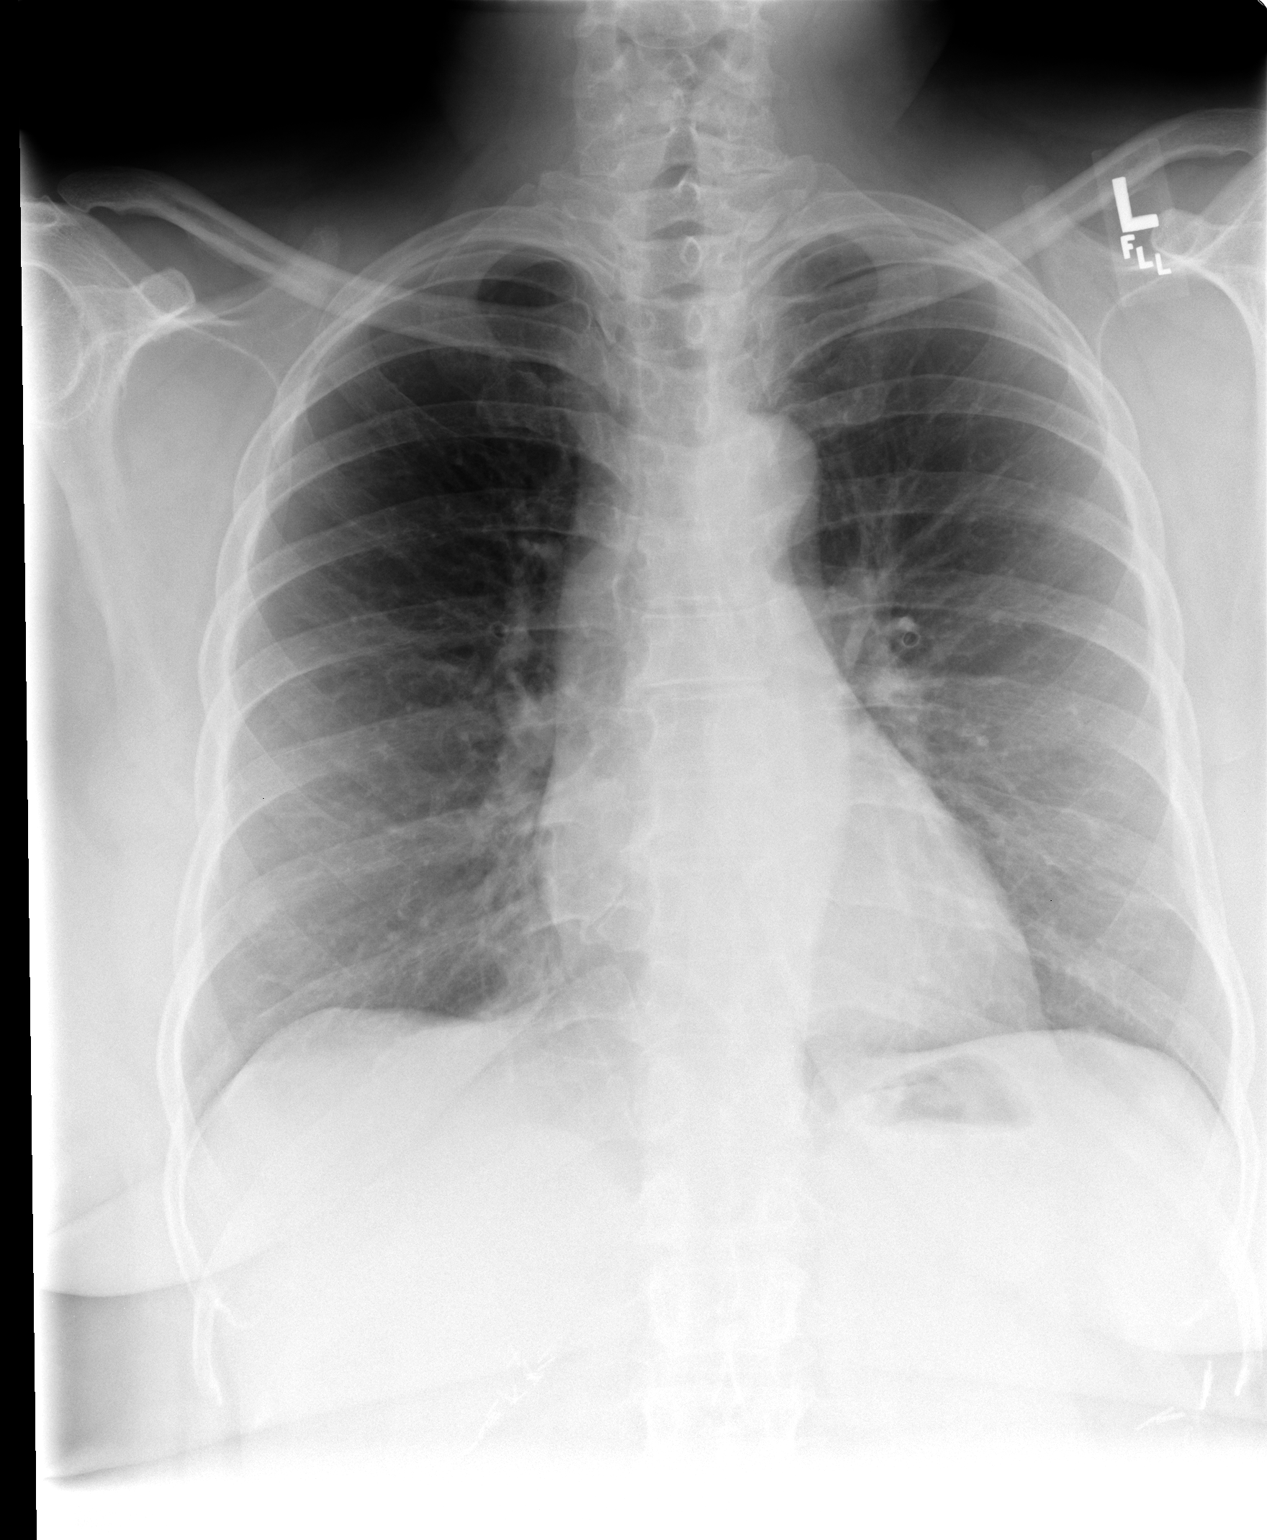

[view not recorded (2 of 2)]
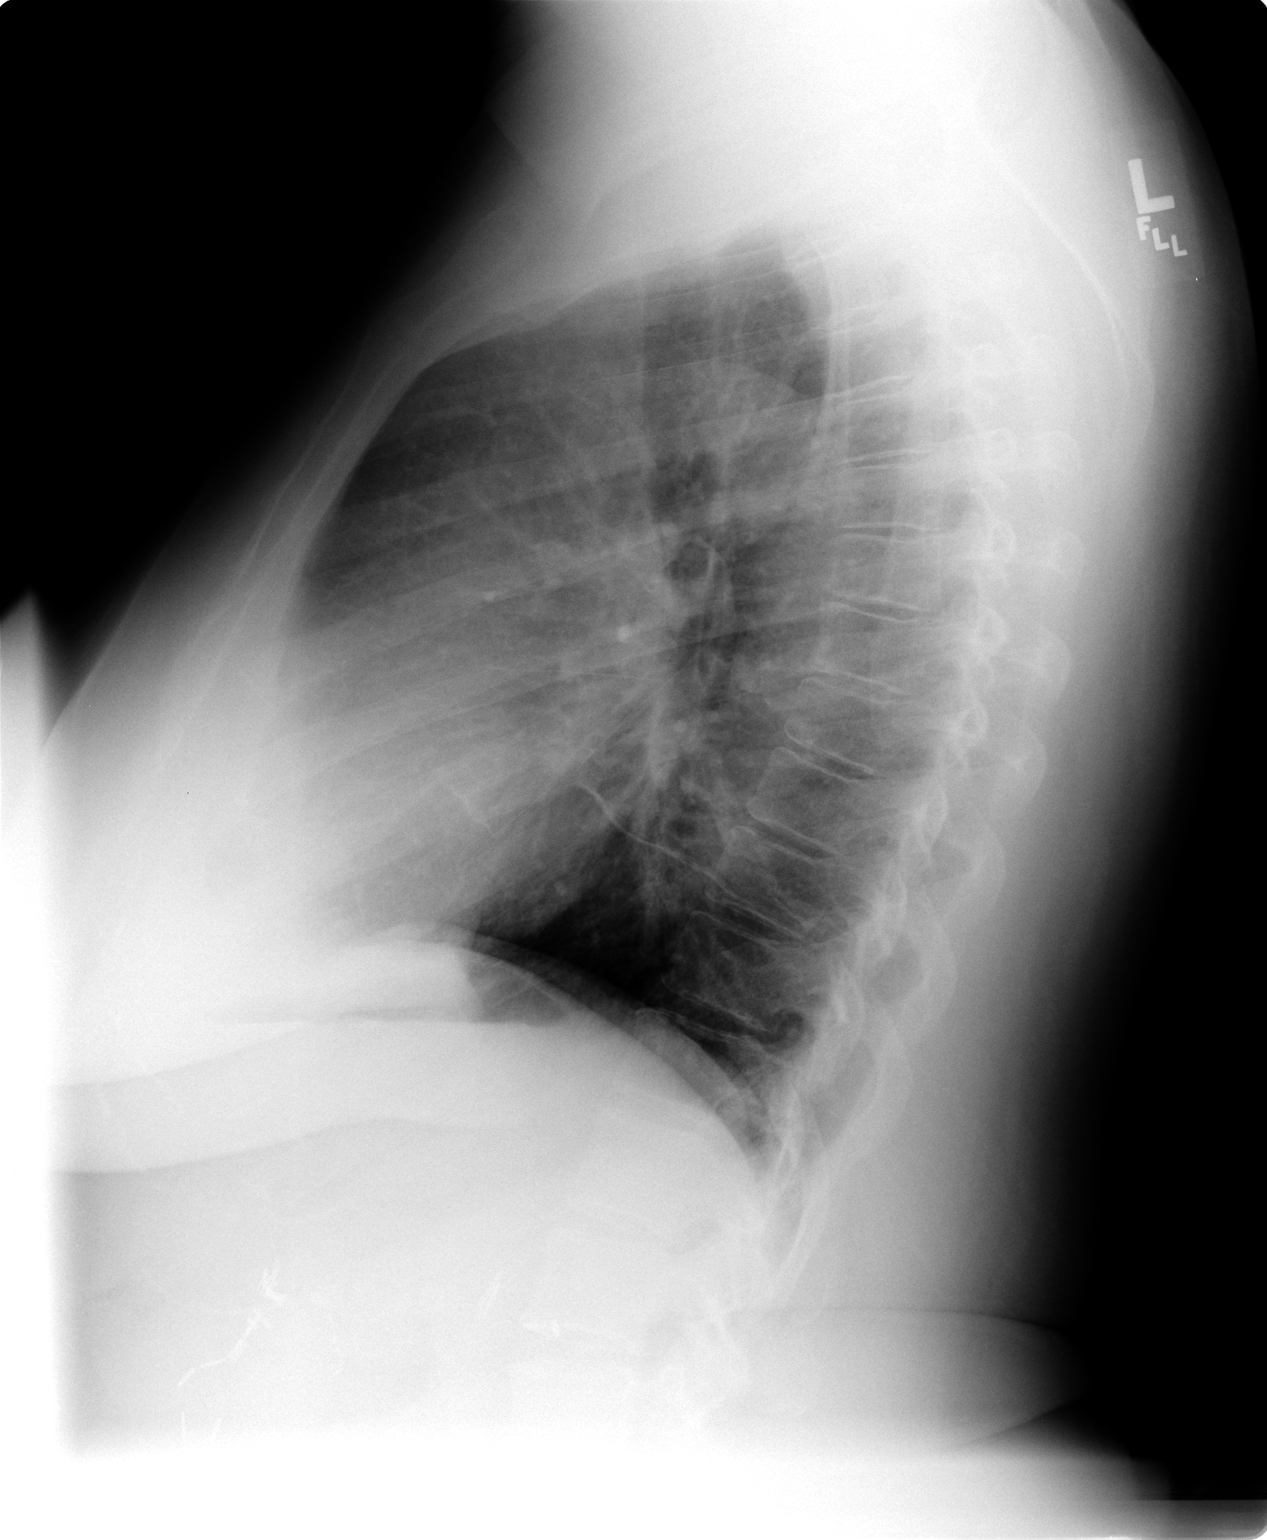

[2 of 2 positions shown; findings below may reference images not displayed]

FINDINGS: Normal heart size and pulmonary vascularity.
Minimally tortuous thoracic aorta.
Minimal bronchitic changes.
No pulmonary infiltrate, pleural effusion, or pneumothorax.
Minimal subsegmental atelectasis at lung bases.
No acute bony findings.
IMPRESSION: Minimal bronchitic changes and bibasilar atelectasis.

## 2011-01-14 ENCOUNTER — Ambulatory Visit
Admission: RE | Admit: 2011-01-14 | Discharge: 2011-01-14 | Payer: Self-pay | Source: Home / Self Care | Attending: Gastroenterology | Admitting: Gastroenterology

## 2011-01-15 ENCOUNTER — Encounter: Payer: Self-pay | Admitting: Internal Medicine

## 2011-01-15 NOTE — Letter (Signed)
Summary: Generic Letter, Intro to Referring  [facility]  [address]   [place], [place] [address]   Phone: [phone]  Fax: [phone]      May 22, 2010             RE: [name]   July 14, 1961                 [address]                 [place], [place]  [address]  pt instructions:  Decrease Entocort to 2 tablets daily for 2 weeks                             then 1 tablet daily for 2 weeks                             then stop!   When taking new medication- Questran- do not take within 2 hours of other medications.           [facility] Ph: [phone]   Fax: [phone]

## 2011-01-15 NOTE — Medication Information (Signed)
Summary: RX Folder SilverScript corresp  RX Folder SilverScript corresp   Imported By: Cammie Sickle 01/24/2010 19:09:11  _____________________________________________________________________  External Attachment:    Type:   Image     Comment:   External Document

## 2011-01-15 NOTE — Progress Notes (Signed)
Summary: Referral to Dr. Eduard Clos.  Phone Note Outgoing Call   Call placed by: Waldon Reining,  March 06, 2010 8:53 AM Call placed to: Specialist Action Taken: Information Sent Summary of Call: I faxed a referral for this patient to Dr. Eduard Clos for an L4-5 facet block.

## 2011-01-15 NOTE — Letter (Signed)
Summary: GES ORDER  GES ORDER   Imported By: Ave Filter 08/14/2010 11:38:37  _____________________________________________________________________  External Attachment:    Type:   Image     Comment:   External Document

## 2011-01-15 NOTE — Letter (Signed)
Summary: [place] [facility] at [facility]  [address] [place] [address]   Phone: [phone]  Fax: [phone]      March 15, 2010 MRN: [id number]   [name] [address] [place], [place]  [address]   Dear Ms. [name],  Your test ordered by Selena Batten has been reviewed by your physician (or physician assistant) and was found to be normal or stable. Your physician (or physician assistant) felt no changes were needed at this time.  ____ Echocardiogram  ____ Cardiac Stress Test  ____ Lab Work  ____ Peripheral vascular study of arms, legs or neck  __x__ CT scan or X-ray  ____ Lung or Breathing test  ____ Other: No change in medical treatment at this time, per Dr. Dietrich Pates.    Thank you,  Allyne Gee RN    [place] Bing, MD, Lenise Arena.C.Gaylord Shih, MD, F.A.C.C Lewayne Bunting, MD, F.A.C.C Nona Dell, MD, F.A.C.C Charlton Haws, MD, Lenise Arena.C.C
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
## 2011-01-15 NOTE — Letter (Signed)
Summary: Historic Patient File  Historic Patient File   Imported By: Elvera Maria 03/02/2010 10:30:31  _____________________________________________________________________  External Attachment:    Type:   Image     Comment:   history form

## 2011-01-15 NOTE — Letter (Signed)
Summary: 2nd Opinion Referral  [facility]  [address]   [place], [place] [address]   Phone: [phone]  Fax: [phone]    Dear Dr. :  I am referring [name], date of birth 03/27/1961, for a second opinion and treatment.  I have enclosed office notes for your review.  Please indicate your opinion regarding diagnosis, and recommendations for treatment and management.  Please feel free to follow this patient and render additional care as needed.  Please do not hesitate to contact my office as needed. Thank you in advance for your cooperation.   Sincerely,
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
## 2011-01-15 NOTE — Assessment & Plan Note (Signed)
Summary: NEW PROBLEN LEFT HIP PAIN NEEDS XR/MEDICARE/BSF   Visit Type:  Follow-up Referring Provider:  Dr. Jena Gauss  Primary Provider:  Robynn Pane  CC:  left hip pain.  History of Present Illness: This is a 50 year old female complains of LEFT hip pain.  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  The patient primarily complains of pain in her back and LEFT buttocks area.  She has throbbing burning pain which runs down her LEFT leg into her LEFT foot it is moderate in severity and is intermittent.  She rates her pain a 6/10.  Any moving around causes her to have discomfort.  She has decreased sensation in the LEFT foot.  She has a history of degenerative disc disease and she has an MRI which was debrided 07/28/09 at that time she had mild lumbar spine degenerative disc disease most severe at L5-S1 with moderate RIGHT facet arthrosis hypertrophic changes of both facets RIGHT greater than LEFT, moderate to mild facet arthrosis bilateral facet hypertrophy with ligamentum flavum redundancy at L4 and 5 and then bilateral facet hypertrophy with ligamentum flavum hypertrophy at L3 and 4.   She has had pain since we seen her in November, but for the past month, it has been constant.  she is already been seen at [facility] spine and told she needed a facet block.    Allergies: 1)  ! * Dilaudid  Physical Exam  Additional Exam:  Her development is normal nutritional status is stable with her body habitus is medium to large she has no deformities she is well groomed  Shows normal pulses no swelling in her legs  She has no lymphadenopathy in the lower extremities  She ambulates well  On inspection she has tenderness in her LEFT gluteal region LEFT iliac crest LEFT lumbar area and central L4-L5 and L5-S1.  She has normal range of motion in her LEFT hip no contracture her LEFT  hip is stable.  Her foot has normal strength on dorsiflexion plantarflexion she has normal abduction strength normal abduction strength normal hip flexion power.  Skin along the spine including cervical thoracic and lumbar is normal LEFT hip and leg normal.  She has decreased sensation in the LEFT dorsum of the foot and lateral border of the foot in the L5 and S1 distribution but reflexes are normal she is oriented to time person and place her mood is without depression.  Her coordination the lower extremities is normal     Impression & Recommendations:  Problem # 1:  DISC DISEASE, LUMBOSACRAL SPINE (ICD-722.52) Assessment Deteriorated I've advised her to go ahead and have the facet block at L4-L5.  If that does not help her she is referred back to [facility] and her primary care physician for further treatment.  There is no other treatment I can offer her at this time.   Orders: Misc. Referral (Misc. Ref) Est. Patient Level III ([address])  Patient Instructions: 1)   Refer for Facet block L4-L5, [place] if possible; 2)   F/u as needed

## 2011-01-15 NOTE — Miscellaneous (Signed)
Summary: Discharge from PT  Discharge from PT   Imported By: Jacklynn Ganong 01/01/2010 11:30:13  _____________________________________________________________________  External Attachment:    Type:   Image     Comment:   External Document
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
## 2011-01-15 NOTE — Progress Notes (Signed)
Summary: vomiting  Phone Note Call from Patient Call back at Home Phone [phone]   Caller: Patient Summary of Call: pt called back- (4:20pm) to let me know that the phenergan supp. are not helping and she is on a clear liquid diet and still unable to keep anything down. Informed pt that this late in the afternoon I may not be able to get back with her before Monday and if she is feeling that bad or gets worse over the weekend she needs to go to ED. pt verbalized understanding. Initial call taken by: Hendricks Limes LPN,  July 06, 2010 4:28 PM     Appended Document: vomiting Looks like this has not been addressed yet....please get PR.  Appended Document: vomiting LMOM to call.  Appended Document: vomiting Spoke with pt. She is on mostly broths/liquids now. When she tries to eat meals the food just does not go down right and seems like it takes it forever to digest. Please advise!  Appended Document: vomiting Recheck CBC/MET-7 r/o dehydration and for n/v. OV first of week. Abd film: for n/v, r/o obstruction, check stool load  Appended Document: vomiting Pt informed, lab order faxed to [facility] working on [employer]  Appended Document: vomiting radiology order faxed to [facility]  Appended Document: vomiting pt aware of appt for 8/3 @ 1130 w/LSL. she is having her labs and ct done today.

## 2011-01-15 NOTE — Letter (Signed)
Summary: External Other  External Other   Imported By: Peggyann Shoals 05/02/2010 10:43:44  _____________________________________________________________________  External Attachment:    Type:   Image     Comment:   External Document

## 2011-01-15 NOTE — Progress Notes (Signed)
Summary: Appointment with Dr. Eduard Clos.  Phone Note From Other Clinic   Caller: Referral Coordinator Reason for Call: Schedule Patient Appt Summary of Call: Patient has an appointment with Dr. Eduard Clos on 03-08-10 at 12:30. Patient is aware of her appointment. Initial call taken by: Waldon Reining,  March 06, 2010 9:47 AM

## 2011-01-15 NOTE — Consult Note (Signed)
Summary: Consult [facility]  Dr Venetia Maxon  Consult [facility]  Dr Venetia Maxon   Imported By: Cammie Sickle 01/09/2010 16:04:03  _____________________________________________________________________  External Attachment:    Type:   Image     Comment:   External Document

## 2011-01-15 NOTE — Letter (Signed)
Summary: Internal Other /EGD/ED Order  Internal Other /EGD/ED Order   Imported By: Cloria Spring LPN 16/09/9603 54:09:81  _____________________________________________________________________  External Attachment:    Type:   Image     Comment:   External Document

## 2011-01-15 NOTE — Letter (Signed)
Summary: Stress Echocardiogram Information Sheet  [facility]  [address] [place] [address]   Phone: [phone]  Fax: [phone]      March 02, 2010 MRN: [id number] light prior to the test.   [name]  Doctor: Appointment Date: Appointment Time: Appointment Location: [facility]  Stress Echocardiogram Information Sheet    Instructions:   1. DO NOT  take your _SPIRONOLACTONE,BUDESONIDE,FEXOFENADINE medicine MORNING days before the test.  2.NOTHING TO EAT OR DRINK AFTER MIDNIGHT  3. Dress prepared to exercise.  4. DO NOT use ANY caffine or tobacco products 3 hours before appointment.  5. Report to the Short Stay Center on the1st floor.  6. Please bring all current prescription medications.  7. If you have any questions, please call [phone]

## 2011-01-15 NOTE — Medication Information (Signed)
Summary: NEXIUM 40MG   NEXIUM 40MG    Imported By: Rexene Alberts 08/22/2010 11:50:32  _____________________________________________________________________  External Attachment:    Type:   Image     Comment:   External Document  Appended Document: NEXIUM 40MG     Prescriptions: NEXIUM 40 MG CPDR (ESOMEPRAZOLE MAGNESIUM) one by mouth 30 mins before breakfast and 30 mins before evening meal  #60 x 5   Entered and Authorized by:   Leanna Battles. Dixon Boos   Signed by:   Leanna Battles Dixon Boos on 08/24/2010   Method used:   Electronically to        [facility] (retail)       [address]       [place], [place]  [address]       Ph: [phone]       Fax: [phone]   RxID:   [id number]

## 2011-01-15 NOTE — Letter (Signed)
Summary: [facility] APPT CONFIRMATION  [facility] APPT CONFIRMATION   Imported By: Ave Filter 03/06/2010 08:32:29  _____________________________________________________________________  External Attachment:    Type:   Image     Comment:   External Document

## 2011-01-15 NOTE — Letter (Signed)
Summary: [facility]  [facility]   Imported By: Minna Merritts 05/18/2010 16:47:20  _____________________________________________________________________  External Attachment:    Type:   Image     Comment:   External Document

## 2011-01-15 NOTE — Letter (Signed)
Summary: OFFICE NOTE/[facility]  OFFICE NOTE/[facility]   Imported By: Diana Eves 03/20/2010 15:20:17  _____________________________________________________________________  External Attachment:    Type:   Image     Comment:   External Document

## 2011-01-15 NOTE — Progress Notes (Signed)
Summary: Request for Second Opinion  ---- Converted from flag ---- ---- 02/28/2010 5:10 PM, Leanna Battles. Dixon Boos wrote: Please refer to [facility] for second opinion. To confirm Crohn's diagnosis and to offer suggestion regarding medical management. Failed Entocort. ------------------------------  Appended Document: Request for Second Opinion Referral faxed to [facility].

## 2011-01-15 NOTE — Letter (Signed)
Summary: OFFICE NOTE/DR HARRISON  OFFICE NOTE/DR HARRISON   Imported By: Diana Eves 03/05/2010 15:03:40  _____________________________________________________________________  External Attachment:    Type:   Image     Comment:   External Document
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
## 2011-01-15 NOTE — Progress Notes (Signed)
Summary: phone note/ continues to have nausea/abd pain  Phone Note Call from Patient   Caller: Patient Summary of Call: Pt called. Said she is taking the Carafate four times a day and Nexium two times a day. She continues to stay nauseated and has intermittent pain in the lower part of her abdomen. Please advise! Initial call taken by: Cloria Spring LPN,  August 13, 2010 1:22 PM     Appended Document: phone note/ continues to have nausea/abd pain lets get a GES  Appended Document: phone note/ continues to have nausea/abd pain Pt scheduled for 08/17/10@8 :00a.m.

## 2011-01-15 NOTE — Letter (Signed)
Summary: Recall Office Visit  [facility]  [address]   [place], [place] [address]   Phone: [phone]  Fax: [phone]      October 02, 2010   [name] [address] [place], [place]  [address] Sep 09, 1961   Dear Ms. [name],   According to our records, it is time for you to schedule a follow-up office visit with [place].   At your convenience, please call [phone] to schedule an office visit. If you have any questions, concerns, or feel that this letter is in error, we would appreciate your call.   Sincerely,    Diana Eves  [facility] Ph: [phone]   Fax: [phone]

## 2011-01-15 NOTE — Letter (Signed)
Summary: [place] [facility], [facility] at [facility]  [address], [place] [address]   Phone: [phone]  Fax: [phone]      March 27, 2010 MRN: [id number]   KIRSTA PROBERT [address] [place], [place]  [address]   Dear Ms. [name],  Your test ordered by Selena Batten has been reviewed by your physician (or physician assistant) and was found to be normal or stable. Your physician (or physician assistant) felt no changes were needed at this time.  _x___ Echocardiogram  ____ Cardiac Stress Test  ____ Lab Work  ____ Peripheral vascular study of arms, legs or neck  ____ CT scan or X-ray  ____ Lung or Breathing test  ____ Other:  No change in medical treatment at this time, per Dr. Dietrich Pates.  Please continue your current medication regimen.  Thank you,  Allyne Gee RN    Trenton Bing, MD, Lenise Arena.C.Gaylord Shih, MD, F.A.C.C Lewayne Bunting, MD, F.A.C.C Nona Dell, MD, F.A.C.C Charlton Haws, MD, Lenise Arena.C.C

## 2011-01-15 NOTE — Letter (Signed)
Summary: [facility]  [facility]   Imported By: Rexene Alberts 09/13/2010 08:26:10  _____________________________________________________________________  External Attachment:    Type:   Image     Comment:   External Document

## 2011-01-15 NOTE — Miscellaneous (Signed)
Summary: Orders Update  Clinical Lists Changes  Orders: Added new Test order of T-CBC w/Diff ([id number]) - Signed Added new Test order of T-Basic Metabolic Panel ([id number]) - Signed 

## 2011-01-15 NOTE — Letter (Signed)
Summary: [facility] REFERRAL  [facility] REFERRAL   Imported By: Ave Filter 03/01/2010 09:03:43  _____________________________________________________________________  External Attachment:    Type:   Image     Comment:   External Document  Appended Document: [facility] REFERRAL Appt. with Dr Steele Sizer 04/26/10@ 10:00a.m. Pt aware of appt.
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
## 2011-01-15 NOTE — Miscellaneous (Signed)
Summary: Orders Update  Clinical Lists Changes  Orders: Added new Test order of T-Basic Metabolic Panel [phone]) - Signed  Appended Document: Orders Update Lab order faxed to [facility] and also mailed to pt with note to do right away. Phone number not a working number.
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
## 2011-01-17 NOTE — Progress Notes (Signed)
Summary: Pt request rx  Phone Note Call from Patient Call back at Home Phone [phone]   Caller: Patient Summary of Call: pt called- stated she gets yeast infection everytime she takes abx.1) pt wants to know if she can have rx for diflucan.   2)Pt also requesting refills on Nexium. pt uses [facility] please advise Initial call taken by: Hendricks Limes LPN,  December 04, 2010 12:22 PM     Appended Document: Pt request rx    Prescriptions: NEXIUM 40 MG CPDR (ESOMEPRAZOLE MAGNESIUM) one by mouth 30 mins before breakfast and 30 mins before evening meal  #60 x 5   Entered and Authorized by:   Joselyn Arrow FNP-BC   Signed by:   Joselyn Arrow FNP-BC on 12/04/2010   Method used:   Electronically to        [facility] (retail)       [address]       [place], [place]  [address]       Ph: [phone]       Fax: [phone]   RxID:   [id number]  DIFLUCAN has MAJOR interaction with one of her meds.  If she needs treatment for vaginal yeast while on abx, would recommend OTC Monistat or similar med for at least 5 days.   Appended Document: Pt request rx pt aware

## 2011-01-17 NOTE — Letter (Signed)
Summary: CT SCAN AB/[facility] ORDER  CT SCAN AB/[facility] ORDER   Imported By: Ave Filter 11/29/2010 10:01:17  _____________________________________________________________________  External Attachment:    Type:   Image     Comment:   External Document
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
## 2011-01-23 NOTE — Letter (Addendum)
Summary: PAIN CLINIC REFERRAL  PAIN CLINIC REFERRAL   Imported By: Ave Filter 01/15/2011 08:55:17  _____________________________________________________________________  External Attachment:    Type:   Image     Comment:   External Document  Appended Document: PAIN CLINIC REFERRAL Per Dawn at Dr Ronal Fear office pt cx her appt with the on 02/06/11.  She stated she was going to see a pain md in [place].Marland KitchenMarland Kitchen

## 2011-02-28 LAB — COMPREHENSIVE METABOLIC PANEL
ALT: 18 U/L (ref 0–35)
Alkaline Phosphatase: 43 U/L (ref 39–117)
CO2: 25 mEq/L (ref 19–32)
Chloride: 106 mEq/L (ref 96–112)
GFR calc non Af Amer: 49 mL/min — ABNORMAL LOW (ref >60)
Glucose, Bld: 105 mg/dL — ABNORMAL HIGH (ref 70–99)
Potassium: 3.8 mEq/L (ref 3.5–5.1)
Sodium: 140 mEq/L (ref 135–145)
Total Bilirubin: 0.3 mg/dL (ref 0.3–1.2)

## 2011-02-28 LAB — DIFFERENTIAL
Eosinophils Absolute: 0.3 10*3/uL (ref 0.0–0.7)
Lymphs Abs: 3.5 10*3/uL (ref 0.7–4.0)
Monocytes Absolute: 0.6 10*3/uL (ref 0.1–1.0)
Neutrophils Relative %: 44 % (ref 43–77)

## 2011-02-28 LAB — CBC
HCT: 37 % (ref 36.0–46.0)
Hemoglobin: 12.5 g/dL (ref 12.0–15.0)
MCHC: 33.8 g/dL (ref 30.0–36.0)
RBC: 3.95 MIL/uL (ref 3.87–5.11)
WBC: 8 10*3/uL (ref 4.0–10.5)

## 2011-03-03 LAB — URINALYSIS, ROUTINE W REFLEX MICROSCOPIC
Bilirubin Urine: NEGATIVE
Ketones, ur: NEGATIVE mg/dL
Nitrite: NEGATIVE
Protein, ur: NEGATIVE mg/dL

## 2011-03-03 LAB — URINE MICROSCOPIC-ADD ON

## 2011-03-10 LAB — CBC
Hemoglobin: 13.1 g/dL (ref 12.0–15.0)
Platelets: 229 10*3/uL (ref 150–400)
RDW: 15.7 % — ABNORMAL HIGH (ref 11.5–15.5)

## 2011-03-10 LAB — DIFFERENTIAL
Basophils Absolute: 0 10*3/uL (ref 0.0–0.1)
Lymphocytes Relative: 50 % — ABNORMAL HIGH (ref 12–46)
Neutro Abs: 2.9 10*3/uL (ref 1.7–7.7)

## 2011-03-10 LAB — BASIC METABOLIC PANEL WITH GFR
BUN: 9 mg/dL (ref 6–23)
CO2: 25 meq/L (ref 19–32)
Calcium: 9.2 mg/dL (ref 8.4–10.5)
Chloride: 108 meq/L (ref 96–112)
Creatinine, Ser: 0.94 mg/dL (ref 0.4–1.2)
GFR calc non Af Amer: >60 mL/min
Glucose, Bld: 106 mg/dL — ABNORMAL HIGH (ref 70–99)
Potassium: 4.1 meq/L (ref 3.5–5.1)
Sodium: 141 meq/L (ref 135–145)

## 2011-03-10 LAB — POCT CARDIAC MARKERS
CKMB, poc: <1 ng/mL — ABNORMAL LOW (ref 1.0–8.0)
Troponin i, poc: <0.05 ng/mL (ref 0.00–0.09)

## 2011-03-10 LAB — URINALYSIS, ROUTINE W REFLEX MICROSCOPIC
Bilirubin Urine: NEGATIVE
Ketones, ur: NEGATIVE mg/dL
Nitrite: NEGATIVE
Urobilinogen, UA: 0.2 mg/dL (ref 0.0–1.0)

## 2011-03-10 LAB — URINE MICROSCOPIC-ADD ON

## 2011-03-27 LAB — OVA AND PARASITE EXAMINATION

## 2011-03-27 LAB — STOOL CULTURE

## 2011-03-27 LAB — CLOSTRIDIUM DIFFICILE EIA: C difficile Toxins A+B, EIA: NEGATIVE

## 2011-03-27 LAB — FECAL LACTOFERRIN, QUANT

## 2011-03-28 LAB — COMPREHENSIVE METABOLIC PANEL
ALT: 45 U/L — ABNORMAL HIGH (ref 0–35)
ALT: 14 U/L (ref 0–35)
AST: 26 U/L (ref 0–37)
Albumin: 2.4 g/dL — ABNORMAL LOW (ref 3.5–5.2)
BUN: 12 mg/dL (ref 6–23)
BUN: 14 mg/dL (ref 6–23)
CO2: 24 mEq/L (ref 19–32)
CO2: 24 mEq/L (ref 19–32)
CO2: 24 mEq/L (ref 19–32)
Calcium: 7.7 mg/dL — ABNORMAL LOW (ref 8.4–10.5)
Calcium: 8.6 mg/dL (ref 8.4–10.5)
Chloride: 112 mEq/L (ref 96–112)
Creatinine, Ser: 1.18 mg/dL (ref 0.4–1.2)
Creatinine, Ser: 1.16 mg/dL (ref 0.4–1.2)
Creatinine, Ser: 1.3 mg/dL — ABNORMAL HIGH (ref 0.4–1.2)
GFR calc Af Amer: 59 mL/min — ABNORMAL LOW (ref >60)
GFR calc non Af Amer: 44 mL/min — ABNORMAL LOW (ref >60)
GFR calc non Af Amer: 50 mL/min — ABNORMAL LOW (ref >60)
Glucose, Bld: 178 mg/dL — ABNORMAL HIGH (ref 70–99)
Glucose, Bld: 184 mg/dL — ABNORMAL HIGH (ref 70–99)
Sodium: 143 mEq/L (ref 135–145)
Total Bilirubin: 0.2 mg/dL — ABNORMAL LOW (ref 0.3–1.2)
Total Protein: 4.7 g/dL — ABNORMAL LOW (ref 6.0–8.3)
Total Protein: 6.4 g/dL (ref 6.0–8.3)

## 2011-03-28 LAB — CBC
HCT: 32.7 % — ABNORMAL LOW (ref 36.0–46.0)
HCT: 32.9 % — ABNORMAL LOW (ref 36.0–46.0)
HCT: 33.8 % — ABNORMAL LOW (ref 36.0–46.0)
Hemoglobin: 10.6 g/dL — ABNORMAL LOW (ref 12.0–15.0)
Hemoglobin: 11.1 g/dL — ABNORMAL LOW (ref 12.0–15.0)
Hemoglobin: 11.3 g/dL — ABNORMAL LOW (ref 12.0–15.0)
MCHC: 33.2 g/dL (ref 30.0–36.0)
MCHC: 33.6 g/dL (ref 30.0–36.0)
MCV: 89.7 fL (ref 78.0–100.0)
MCV: 89.2 fL (ref 78.0–100.0)
MCV: 89.7 fL (ref 78.0–100.0)
MCV: 89.8 fL (ref 78.0–100.0)
MCV: 90.1 fL (ref 78.0–100.0)
Platelets: 249 10*3/uL (ref 150–400)
Platelets: 250 10*3/uL (ref 150–400)
Platelets: 267 10*3/uL (ref 150–400)
RBC: 3.55 MIL/uL — ABNORMAL LOW (ref 3.87–5.11)
RBC: 3.66 MIL/uL — ABNORMAL LOW (ref 3.87–5.11)
RBC: 3.67 MIL/uL — ABNORMAL LOW (ref 3.87–5.11)
RDW: 15.8 % — ABNORMAL HIGH (ref 11.5–15.5)
RDW: 15.4 % (ref 11.5–15.5)
RDW: 15.5 % (ref 11.5–15.5)
RDW: 15.8 % — ABNORMAL HIGH (ref 11.5–15.5)
WBC: 10.6 10*3/uL — ABNORMAL HIGH (ref 4.0–10.5)
WBC: 13.6 10*3/uL — ABNORMAL HIGH (ref 4.0–10.5)
WBC: 14.9 10*3/uL — ABNORMAL HIGH (ref 4.0–10.5)
WBC: 14.9 10*3/uL — ABNORMAL HIGH (ref 4.0–10.5)

## 2011-03-28 LAB — GLUCOSE, CAPILLARY
Glucose-Capillary: 114 mg/dL — ABNORMAL HIGH (ref 70–99)
Glucose-Capillary: 116 mg/dL — ABNORMAL HIGH (ref 70–99)
Glucose-Capillary: 136 mg/dL — ABNORMAL HIGH (ref 70–99)
Glucose-Capillary: 139 mg/dL — ABNORMAL HIGH (ref 70–99)
Glucose-Capillary: 140 mg/dL — ABNORMAL HIGH (ref 70–99)
Glucose-Capillary: 151 mg/dL — ABNORMAL HIGH (ref 70–99)
Glucose-Capillary: 162 mg/dL — ABNORMAL HIGH (ref 70–99)
Glucose-Capillary: 167 mg/dL — ABNORMAL HIGH (ref 70–99)
Glucose-Capillary: 171 mg/dL — ABNORMAL HIGH (ref 70–99)
Glucose-Capillary: 173 mg/dL — ABNORMAL HIGH (ref 70–99)
Glucose-Capillary: 208 mg/dL — ABNORMAL HIGH (ref 70–99)
Glucose-Capillary: 214 mg/dL — ABNORMAL HIGH (ref 70–99)

## 2011-03-28 LAB — BLOOD GAS, ARTERIAL
TCO2: 21.8 mmol/L (ref 0–100)
pCO2 arterial: 39.3 mmHg (ref 35.0–45.0)
pH, Arterial: 7.399 (ref 7.350–7.400)
pO2, Arterial: 77.6 mmHg — ABNORMAL LOW (ref 80.0–100.0)

## 2011-03-28 LAB — DIFFERENTIAL
Basophils Absolute: 0 10*3/uL (ref 0.0–0.1)
Basophils Absolute: 0 10*3/uL (ref 0.0–0.1)
Basophils Absolute: 0 10*3/uL (ref 0.0–0.1)
Basophils Absolute: 0 10*3/uL (ref 0.0–0.1)
Basophils Relative: 0 % (ref 0–1)
Basophils Relative: 0 % (ref 0–1)
Eosinophils Absolute: 0.1 10*3/uL (ref 0.0–0.7)
Eosinophils Absolute: 0 10*3/uL (ref 0.0–0.7)
Eosinophils Absolute: 0 10*3/uL (ref 0.0–0.7)
Eosinophils Absolute: 0 10*3/uL (ref 0.0–0.7)
Eosinophils Relative: 0 % (ref 0–5)
Eosinophils Relative: 0 % (ref 0–5)
Lymphocytes Relative: 10 % — ABNORMAL LOW (ref 12–46)
Lymphocytes Relative: 15 % (ref 12–46)
Lymphocytes Relative: 17 % (ref 12–46)
Lymphocytes Relative: 18 % (ref 12–46)
Lymphs Abs: 4.4 10*3/uL — ABNORMAL HIGH (ref 0.7–4.0)
Lymphs Abs: 1.4 10*3/uL (ref 0.7–4.0)
Lymphs Abs: 1.8 10*3/uL (ref 0.7–4.0)
Lymphs Abs: 2.7 10*3/uL (ref 0.7–4.0)
Monocytes Absolute: 0.3 10*3/uL (ref 0.1–1.0)
Monocytes Absolute: 0.8 10*3/uL (ref 0.1–1.0)
Monocytes Relative: 2 % — ABNORMAL LOW (ref 3–12)
Monocytes Relative: 3 % (ref 3–12)
Neutro Abs: 11.2 10*3/uL — ABNORMAL HIGH (ref 1.7–7.7)
Neutro Abs: 12.7 10*3/uL — ABNORMAL HIGH (ref 1.7–7.7)
Neutro Abs: 8.8 10*3/uL — ABNORMAL HIGH (ref 1.7–7.7)
Neutrophils Relative %: 53 % (ref 43–77)
Neutrophils Relative %: 76 % (ref 43–77)
Neutrophils Relative %: 78 % — ABNORMAL HIGH (ref 43–77)
Neutrophils Relative %: 81 % — ABNORMAL HIGH (ref 43–77)
Neutrophils Relative %: 82 % — ABNORMAL HIGH (ref 43–77)

## 2011-03-28 LAB — FOLATE: Folate: 4.9 ng/mL

## 2011-03-28 LAB — HEMOGLOBIN A1C
Hgb A1c MFr Bld: 6.3 % — ABNORMAL HIGH (ref 4.6–6.1)
Mean Plasma Glucose: 134 mg/dL

## 2011-03-28 LAB — BASIC METABOLIC PANEL
BUN: 22 mg/dL (ref 6–23)
CO2: 22 mEq/L (ref 19–32)
Calcium: 8.9 mg/dL (ref 8.4–10.5)
Chloride: 112 mEq/L (ref 96–112)
Creatinine, Ser: 1.13 mg/dL (ref 0.4–1.2)
Creatinine, Ser: 1.25 mg/dL — ABNORMAL HIGH (ref 0.4–1.2)
GFR calc Af Amer: >60 mL/min (ref >60)
GFR calc non Af Amer: 38 mL/min — ABNORMAL LOW (ref >60)
GFR calc non Af Amer: 46 mL/min — ABNORMAL LOW (ref >60)
Potassium: 5.5 mEq/L — ABNORMAL HIGH (ref 3.5–5.1)
Sodium: 136 mEq/L (ref 135–145)

## 2011-03-28 LAB — LIPASE, BLOOD: Lipase: 25 U/L (ref 11–59)

## 2011-03-28 LAB — URINALYSIS, ROUTINE W REFLEX MICROSCOPIC
Glucose, UA: NEGATIVE mg/dL
Leukocytes, UA: NEGATIVE
Nitrite: NEGATIVE
Specific Gravity, Urine: 1.025 (ref 1.005–1.030)
pH: 5.5 (ref 5.0–8.0)

## 2011-03-28 LAB — PROTIME-INR
INR: 1.1 (ref 0.00–1.49)
Prothrombin Time: 14 seconds (ref 11.6–15.2)

## 2011-03-28 LAB — IRON AND TIBC
Iron: 58 ug/dL (ref 42–135)
TIBC: 371 ug/dL (ref 250–470)
UIBC: 313 ug/dL

## 2011-03-28 LAB — URINE MICROSCOPIC-ADD ON

## 2011-03-28 LAB — FERRITIN: Ferritin: 53 ng/mL (ref 10–291)

## 2011-04-02 ENCOUNTER — Encounter: Payer: Self-pay | Admitting: Internal Medicine

## 2011-04-02 LAB — COMPREHENSIVE METABOLIC PANEL
ALT: 18 U/L (ref 0–35)
AST: 17 U/L (ref 0–37)
Calcium: 8.9 mg/dL (ref 8.4–10.5)
Creatinine, Ser: 1.61 mg/dL — ABNORMAL HIGH (ref 0.4–1.2)
GFR calc Af Amer: 41 mL/min — ABNORMAL LOW (ref >60)
GFR calc non Af Amer: 34 mL/min — ABNORMAL LOW (ref >60)
Sodium: 139 mEq/L (ref 135–145)
Total Protein: 6.3 g/dL (ref 6.0–8.3)

## 2011-04-02 LAB — URINALYSIS, ROUTINE W REFLEX MICROSCOPIC
Glucose, UA: NEGATIVE mg/dL
Nitrite: NEGATIVE
Specific Gravity, Urine: 1.02 (ref 1.005–1.030)
pH: 5.5 (ref 5.0–8.0)

## 2011-04-02 LAB — URINE CULTURE: Colony Count: 10000

## 2011-04-02 LAB — DIFFERENTIAL
Eosinophils Absolute: 0.3 10*3/uL (ref 0.0–0.7)
Eosinophils Relative: 4 % (ref 0–5)
Lymphocytes Relative: 54 % — ABNORMAL HIGH (ref 12–46)
Lymphs Abs: 4.7 10*3/uL — ABNORMAL HIGH (ref 0.7–4.0)
Monocytes Relative: 4 % (ref 3–12)

## 2011-04-02 LAB — CBC
MCHC: 33.3 g/dL (ref 30.0–36.0)
MCV: 88.8 fL (ref 78.0–100.0)
Platelets: 281 10*3/uL (ref 150–400)
RDW: 15 % (ref 11.5–15.5)

## 2011-04-02 LAB — LIPASE, BLOOD: Lipase: 30 U/L (ref 11–59)

## 2011-04-09 ENCOUNTER — Encounter: Payer: Self-pay | Admitting: Gastroenterology

## 2011-04-09 ENCOUNTER — Ambulatory Visit (INDEPENDENT_AMBULATORY_CARE_PROVIDER_SITE_OTHER): Payer: Medicare Other | Admitting: Gastroenterology

## 2011-04-09 VITALS — BP 103/73 | HR 85 | Temp 97.0°F | Ht 63.0 in | Wt 176.4 lb

## 2011-04-09 DIAGNOSIS — R634 Abnormal weight loss: Secondary | ICD-10-CM

## 2011-04-09 DIAGNOSIS — R102 Pelvic and perineal pain: Secondary | ICD-10-CM | POA: Insufficient documentation

## 2011-04-09 DIAGNOSIS — R109 Unspecified abdominal pain: Secondary | ICD-10-CM

## 2011-04-09 DIAGNOSIS — R935 Abnormal findings on diagnostic imaging of other abdominal regions, including retroperitoneum: Secondary | ICD-10-CM

## 2011-04-09 NOTE — Progress Notes (Signed)
Primary Care Physician: Toma Deiters, MD  Primary Gastroenterologist:  Dr. Roetta Sessions  Chief Complaint  Patient presents with  . Follow-up    still having abd pain    HPI: [name] is a 50 y.o. female here for followup of abdominal pain. She was last seen in January of this year. She was referred to Dr. Gerilyn Pilgrim for pain management. She states she took the class and paid her $30. She states she was told that she cannot come there since Dr. Jodie Echevaria provides her with narcotics. By her description I think she misled them to think that she was going to him for pain management however he is her orthopedic surgeon who provides her with narcotics for chronic knee pain. I told her that she could go to pain management but they would require that she receive no narcotics from any outside providers.   Day to day, lower abdominal cramp, intermittent. The newer pain in lower pelvic area worries her. Bending/turning makes it worse. Can happen without anything. Started up sometime last year. Complains of dysuria. She is worried about unintentional weight loss. Her weight is down 25 pounds in the last 2 years. CT A/P 11/2010, Slight prominent mid ureteral diameter with enhancing walls, nonspecific but could be seen with infection.  Current Outpatient Prescriptions  Medication Sig Dispense Refill  . albuterol-ipratropium (COMBIVENT) 18-103 MCG/ACT inhaler Inhale 2 puffs into the lungs every 6 (six) hours as needed.        . ALPRAZolam (XANAX) 1 MG tablet Take 1 mg by mouth daily.        Marland Kitchen dicyclomine (BENTYL) 10 MG capsule Take 10 mg by mouth 4 (four) times daily -  before meals and at bedtime.        Marland Kitchen esomeprazole (NEXIUM) 40 MG capsule Take 40 mg by mouth 2 (two) times daily.        . fexofenadine (ALLEGRA) 180 MG tablet Take 180 mg by mouth daily.        Marland Kitchen gabapentin (NEURONTIN) 300 MG capsule Take 300 mg by mouth 2 (two) times daily.        Marland Kitchen HYDROcodone-acetaminophen (NORCO) 10-325 MG per tablet  Take 1 tablet by mouth every 6 (six) hours as needed.        Marland Kitchen imipramine (TOFRANIL) 10 MG tablet Take 40 mg by mouth at bedtime.        Marland Kitchen levothyroxine (SYNTHROID, LEVOTHROID) 112 MCG tablet Take 112 mcg by mouth daily.        . potassium chloride (KLOR-CON) 10 MEQ CR tablet Take 20 mEq by mouth daily.        . QUEtiapine (SEROQUEL) 50 MG tablet Take 50 mg by mouth at bedtime.        Marland Kitchen spironolactone (ALDACTONE) 50 MG tablet Take 50 mg by mouth daily.        . SUMAtriptan (IMITREX) 100 MG tablet Take 100 mg by mouth every 2 (two) hours as needed.          Allergies as of 04/09/2011 - Review Complete 04/09/2011  Allergen Reaction Noted  . Hydromorphone hcl      ROS:  General:  See HPI, Negative for fever, chills, fatigue, weakness. ENT: Negative for hoarseness, difficulty swallowing , nasal congestion. CV: Negative for chest pain, angina, palpitations, dyspnea on exertion, peripheral edema.  Respiratory: Negative for dyspnea at rest, dyspnea on exertion, cough, sputum, wheezing.  GI: See history of present illness. GU:  Negative for dysuria, hematuria, urinary incontinence, urinary  frequency, nocturnal urination.  Endo: Negative for unusual weight change.    Physical Examination:   BP 103/73  Pulse 85  Temp(Src) 97 F (36.1 C) (Tympanic)  Ht 5\' 3"  (1.6 m)  Wt 176 lb 6.4 oz (80.015 kg)  BMI 31.25 kg/m2  General: Well-nourished, well-developed in no acute distress.  Eyes: No icterus. Mouth: Oropharyngeal mucosa moist and pink , no lesions erythema or exudate. Lungs: Clear to auscultation bilaterally.  Heart: Regular rate and rhythm, no murmurs rubs or gallops.  Abdomen: Bowel sounds are normal, moderate suprapubic tenderness to deep palpation, diffuse tenderness as well, nondistended, no hepatosplenomegaly or masses, no abdominal bruits or hernia , no rebound or guarding.   Extremities: No lower extremity edema.  Neuro: Alert and oriented x 4   Skin: Warm and dry, no jaundice.    Psych: Alert and cooperative, normal mood and affect.

## 2011-04-10 LAB — COMPREHENSIVE METABOLIC PANEL
AST: 15 U/L (ref 0–37)
Albumin: 4.4 g/dL (ref 3.5–5.2)
Alkaline Phosphatase: 56 U/L (ref 39–117)
Chloride: 104 mEq/L (ref 96–112)
Glucose, Bld: 84 mg/dL (ref 70–99)
Potassium: 4.7 mEq/L (ref 3.5–5.3)
Sodium: 141 mEq/L (ref 135–145)
Total Protein: 7 g/dL (ref 6.0–8.3)

## 2011-04-10 LAB — CBC WITH DIFFERENTIAL/PLATELET
Basophils Absolute: 0 10*3/uL (ref 0.0–0.1)
HCT: 42.8 % (ref 36.0–46.0)
Lymphocytes Relative: 46 % (ref 12–46)
Lymphs Abs: 3.7 10*3/uL (ref 0.7–4.0)
Monocytes Absolute: 0.4 10*3/uL (ref 0.1–1.0)
Neutro Abs: 3.7 10*3/uL (ref 1.7–7.7)
RBC: 4.61 MIL/uL (ref 3.87–5.11)
RDW: 14.8 % (ref 11.5–15.5)
WBC: 8.1 10*3/uL (ref 4.0–10.5)

## 2011-04-11 ENCOUNTER — Encounter: Payer: Self-pay | Admitting: Gastroenterology

## 2011-04-11 LAB — URINALYSIS
Bilirubin Urine: NEGATIVE
Glucose, UA: NEGATIVE mg/dL
Protein, ur: NEGATIVE mg/dL
Specific Gravity, Urine: 1.017 (ref 1.005–1.030)
pH: 6 (ref 5.0–8.0)

## 2011-04-11 NOTE — Assessment & Plan Note (Signed)
Abnormal weight loss, unintentional. Workup as outlined under suprapubic pain assessment and plan.

## 2011-04-11 NOTE — Assessment & Plan Note (Addendum)
Chronic abdominal pain but complains of suprapubic pain for one year. Multiple abdominal surgeries. History of IBS, ileitis felt not to be due to IBD per [facility], likely has adhesions from prior surgeries. Check urinalysis. Doubt ureter finding or soft tissue density near psoas muscle on last CT significant but may need to repeat CT if labs or urinalysis unremarkable. If workup remains unremarkable, consider referral back to pain management as original plan.

## 2011-04-11 NOTE — Assessment & Plan Note (Signed)
Await labs and urinalysis, may need reimaging.

## 2011-04-12 NOTE — Progress Notes (Signed)
Cc to PCP 

## 2011-04-30 NOTE — Op Note (Signed)
NAME:  [name], [name]               ACCOUNT NO.:  [id number]   MEDICAL RECORD NO.:  [id number]          PATIENT TYPE:  INP   LOCATION:  A320                          FACILITY:  [facility]   PHYSICIAN:  Tilda Burrow, M.D. DATE OF BIRTH:  Apr 14, 1961   DATE OF PROCEDURE:  05/02/2008  DATE OF DISCHARGE:                               OPERATIVE REPORT   PREOPERATIVE DIAGNOSES:  1. Chronic left lower quadrant pain associated with left ovarian cyst.  2. Pelvic adhesions.  3. Status post colectomy for diverticulitis.   POSTOPERATIVE DIAGNOSES:  1. Chronic left lower quadrant pain associated with left ovarian cyst.  2. Pelvic adhesions.  3. Status post colectomy for diverticulitis.   PROCEDURES:  1. Bilateral salpingo-oophorectomy.  2. Lysis of adhesion.   SURGEON:  Tilda Burrow, MD   ASSISTANT:  Lovell Sheehan.   ANESTHESIA:  General.   COMPLICATIONS:  Partial-thickness 5-mm small bowel denuded area  associated with peritoneal entry, oversewn by Dr. Lovell Sheehan.   FINDINGS:  Bilateral tubes and ovaries sent to lab.   ESTIMATED BLOOD LOSS:  Less than 100 mL.   DETAILS OF PROCEDURE:  The patient was taken to the operating room,  prepped and draped, Foley catheter in place, received IV antibiotic  prophylaxis, and time-out completed.  A midline vertical incision was  performed with sharp dissection through the skin and fatty tissue down  the fascia, which was grasped with Kocher clamps, elevated as much as  possible and carefully slowly entered, using a Kocher clamps to elevate  the abdominal wall and moving millimeter by millimeter through the  tissues.  The bowel was extensively adherent to the anterior abdominal  wall; and upon moving upward to try to identify the site, there was  better accessibility, there was a small partial-thickness 5-mm denuding  of the portion of the small bowel in the upper part of the incision.  This was noted medially and monitored for the duration of the  case.  There was no bile leakage from this area at any point during the case.  A window of tissue free from adhesions was identified in the midline.  The abdomen uses an orientation to aggressively peal the omentum, and  small bowel from its extensive thin filmy adhesions.  The thin filmy  adhesions were carefully dissected free with sharp and blunt dissection  throughout the lower abdomen up to the level of the umbilicus.  There  was a pocket of relatively normal-appearing peritoneum in the left lower  quadrant, which was fortunately allowed [place] access to the left adnexa,  lateral to the sigmoid colon.  A fairly large 4-cm cystic ovary and a  small portion of dilated tube beside it were identified in this area.  Sharp dissection was carefully continued until the tube and ovary could  be placed on traction.  A round ligament was taken down anteriorly, and  the retroperitoneum entered, and the infundibulopelvic tissue was  isolated.  We could not distinctly palpate the ureter, but palpation of  the infundibulopelvic ligament identified.  No structure that could be  felt suspicious for the ureter.  Nonetheless, we decided to go down  below the ovary and carefully peal the ovary of the left sidewall.  This  was performed by initially exposing and taking down the left utero-  ovarian ligament, placing the tube and ovary on traction and gradually  pealing the tube and ovary of the lateral sidewall.  The ovary itself  was taken down, being careful to remain in close proximity to the ovary.  The infundibulopelvic ligament was clamped, cut, and suture ligated  immediately above the ovary.  The left fallopian tube remnant was  removed easily, similarly with an easy cautious approach.   Further adhesiolysis was initially identified at the right adnexa in the  similar fashion.  Eventually, a small right ovary could be identified  and was elevated, placed on traction, and ligated.  The patient then  had  irrigation of the pelvis and inspection of all pedicles.  A decision was  made to close after satisfactory irrigation.  It should be noted that  the ureter had been palpated by banjo string plucking technique with one  string in the retroperitoneum and one in the subperitoneal cavity during  the case.   The abdomen was irrigated.  Confirmed as hemostatic.  The subcu tissues  were reapproximated after 0 Prolene continuous running closure of the  fascia.  Once the peritoneum was closed with continuous running 0 Vicryl  at the level of the fascia, subcu tissues were closed using interrupted  2-0 chromic and 3-0 plain sutures and staple closure of the skin edges  using 0 Vicryl suture completed the procedure.      Tilda Burrow, M.D.  Electronically Signed     JVF/MEDQ  D:  05/02/2008  T:  05/03/2008  Job:  [id number]   cc:   Lia Hopping  Fax: [phone]

## 2011-04-30 NOTE — H&P (Signed)
NAME:  [name], [name]               ACCOUNT NO.:  [id number]   MEDICAL RECORD NO.:  [id number]          PATIENT TYPE:  AMB   LOCATION:  DAY                           FACILITY:  [facility]   PHYSICIAN:  Tilda Burrow, M.D. DATE OF BIRTH:  1961/08/11   DATE OF ADMISSION:  DATE OF DISCHARGE:  LH                              HISTORY & PHYSICAL   ADMITTING DIAGNOSES:  1. Chronic left lower quadrant pain associated with left ovarian cyst.  2. Status post partial colectomy for diverticulitis.  3. Status post vaginal hysterectomy 20 years ago.   HISTORY OF PRESENT ILLNESS:  This 50 year old female, now 20 years  status post hysterectomy, is now 5 years status post acute  diverticulitis necessitating ileostomy and then subsequent reclosure of  ileostomy 4 months later.  This was performed in 2004 by Dr. Katrinka Blazing.  The  patient is now seen in our office for chronic recurring debilitating  pain in the right lower quadrant associated with ovarian function.  She  is suspected to have rather extensive adhesions around the left ovary  and the prior functional pain is incapacitating to the patient.  She is  a patient of Dr. Lia Hopping who has been referred to our office for  evaluation and treatment.  The patient has had pelvic exam in our office  with transvaginal ultrasound, which confirms a small cyst and exquisite  pain in the area just above the vagina, where the ovarian cyst exist.  The patient is fully aware that we cannot guarantee that the pain will  be resolved and her response to the removal of tube and ovary.  It has  been explained to the patient that there is significant risk due to the  probable extensive adhesions.  Nonetheless, I believe that likelihood of  improved comfort with ovarian removal is quite high; and therefore we  are proceeding with laparotomy.  Due to the extensive nature of prior GI  surgeries, Dr. Lovell Sheehan will be asked to assess.   Past medical history also is notable  for chronic pain.  The patient has  been managed [facility] and is currently on  morphine 30 mg p.o. q.12 h. for the pain.   MEDICATIONS:  Additional medications are as follows:  1. Morphine sulfate 30 mg p.o. twice daily by Dr. Ethelene Hal.  2. Nexium 40 mg twice daily for reflux disease by Dr. Learta Codding.  3. Amlodipine/benazepril 5/7.5 one tablet p.o. daily.  4. Potassium chloride 20 mEq daily.  5. Synthroid 0.112 mg daily.  6. Diclofenac 75 mg 1-2 tablets daily.  7. Seroquel 50 mg 1 p.o. at bedtime.  8. Cymbalta 60 mg 1 p.o. daily.  9. Aldactone 50 mg twice daily.  10.Dicyclomine 20 mg 1 p.o. every 6 hours.  11.Gabapentin 30 mg 3 times daily.  12.Metoclopramide 5 mg 4 times daily.  13.Caltrate D twice daily.  14.Famciclovir 500 mg 1 p.o. daily.   ALLERGIES:  DILAUDID, which causes itching.   PHYSICAL EXAMINATION:  GENERAL:  Shows an obese [place] American female,  alert and oriented x3.  VITAL SIGNS:  Weight 222.6, blood pressure 92/60.  HEENT:  Pupils are equal, round, and reactive.  NECK:  Supple.  CHEST:  Clear to auscultation.  ABDOMEN:  Obese.  Midline incision as well as a right lower quadrant  incision at the site of ileostomy takedown.  EXTERNAL GENITALIA:  Normal vaginal exam.  Normal external vagina.  Good  vaginal cuff support.  Cervix is absent.  Cuff is tender, pretty well on  the left side consistent with ovarian cyst noted on the ultrasound at  [facility].  RECTAL:  Negative.  EXTREMITIES:  Grossly normal without cyanosis, clubbing, or edema.   PLAN:  Bowel prep on Sunday, laparotomy, lysis of adhesions, and  probable bilateral salpingo-oophorectomy with Dr. Lovell Sheehan to assist, due  to adhesions, scheduled for May 02, 2008.      Tilda Burrow, M.D.  Electronically Signed     JVF/MEDQ  D:  04/29/2008  T:  04/30/2008  Job:  [id number]   cc:   Dalia Heading, M.D.  Fax: [phone]   [facility]   Lia Hopping  Fax:  [phone]

## 2011-04-30 NOTE — Group Therapy Note (Signed)
NAME:  [name], [name]               ACCOUNT NO.:  [id number]   MEDICAL RECORD NO.:  [id number]          PATIENT TYPE:  INP   LOCATION:  A321                          FACILITY:  [facility]   PHYSICIAN:  Margaretmary Dys, M.D.DATE OF BIRTH:  09-03-1961   DATE OF PROCEDURE:  02/26/2009  DATE OF DISCHARGE:                                 PROGRESS NOTE   SUBJECTIVE:  The patient continues to complain of generalized weakness.  She also reports generalized body ache.  She says she is a little bit  short of breath and has had difficulty getting around in her room.  The  patient may likely need a short-term rehab prior to discharge.  She  continues to have some wheezing.   OBJECTIVE:  GENERAL:  Conscious, alert, comfortable, not in acute  distress.  Fully oriented to time, place and person.  VITAL SIGNS:  Blood pressure is 136/76, pulse is 94, respirations 18,  temperature 98.4 degrees Fahrenheit, oxygen saturation was 95% on room  air.  HEENT:  Normocephalic, atraumatic.  Oral mucosa was dry.  No exudates  were noted.  NECK:  Supple.  No JVD or lymphadenopathy.  LUNGS:  Reduced air entry bilaterally with occasional crackles at the  bases.  The patient has wheezing with bilateral rhonchi.  HEART:  S1-S2 regular, no S3, S4, gallops or rubs.  ABDOMEN:  Abdomen was soft, nontender.  Bowel sounds were positive.  EXTREMITIES:  No edema.  CNS:  Exam was grossly intact with no focal neurological deficits.   LABORATORY DIAGNOSTIC DATA:  Blood sugars ranging between 151-212.   ASSESSMENT:  1. Pneumonia with bronchospasm.  2. Acute on chronic abdominal pain.  3. Avascular necrosis of the femoral head with followup by orthopedic      surgery.   PLAN:  1. The patient will continue on IV antibiotics with Zithromax.  I      think we can transition her to oral tomorrow.  2. Continue on Solu-Medrol 60 mg IV q.6 hours.  3. Will continue her other medications at this time.  4. Will request physical  therapy evaluation.  The patient may need      short-term rehab as she has concerns about being able to go home on      her own.   DISPOSITION:  Likely home in the next 24-48 hours once we can resolve  issues of physical therapy and will also switch her to oral antibiotics  in the morning.  She remains hemodynamically stable at this time.  The  patient may also follow up with the gastroenterologist for her chronic  abdominal pain.      Margaretmary Dys, M.D.  Electronically Signed     AM/MEDQ  D:  02/26/2009  T:  02/26/2009  Job:  [id number]

## 2011-04-30 NOTE — Op Note (Signed)
NAME:  [name], [name]               ACCOUNT NO.:  [id number]   MEDICAL RECORD NO.:  [id number]          PATIENT TYPE:  AMB   LOCATION:  DAY                           FACILITY:  [facility]   PHYSICIAN:  R. Roetta Sessions, M.D. DATE OF BIRTH:  [birth year]   DATE OF PROCEDURE:  04/13/2009  DATE OF DISCHARGE:                               OPERATIVE REPORT   PROCEDURE PERFORMED:  Colonoscopy with ileoscopy, biopsy, stool  sampling.   INDICATIONS FOR PROCEDURE:  A 50 year old [place] American female with  chronic abdominal pain, right-sided, diarrhea, Hemoccult positive stool,  borderline thickening of the distal small bowel anastomosis (status post  total colectomy for complicated diverticulitis). Prior colonoscopy years  ago reportedly reveals small ulcers which was not biopsied.  Ileocolonoscopy now being done.  Risks, benefits, alternatives and  limitations have been discussed, questions answered.  Her lactoferrin  positive too for the record.  Her questions answered.  She is agreeable.   PROCEDURE NOTE:  O2 saturation, blood pressure, pulse, respirations  monitored the entire procedure.   CONSCIOUS SEDATION:  Versed 6 mg IV, Demerol mg IV in divided doses.   INSTRUMENT:  Pentax video chip system.   FINDINGS:  Digital rectal exam revealed no abnormalities.   ENDOSCOPIC FINDINGS:  The prep was adequate.  Rectum:  Examination of rectal mucosa including retroflexed view of the  anal verge demonstrated no abnormalities.  The distal colon was  inspected, the rectosigmoid junction at 30 cm where there was surgical  anastomosis with small bowel.  Examination of the anastomosis  demonstrated the area of ulceration on the anastomotic rim which was  markedly friable, however, the residual sigmoid mucosa to the  anastomosis appeared normal.  I was able to intubate the neoterminal  ileum easily to approximately 30 cm.  There are multiple areas of  ulceration with intervening areas of what  appeared  be entirely normal  ileal mucosa.  Please see multiple photographs.  At least one ulcer was  a good 1 x 2 cm in dimensions.  The others were more like 5 mm to 1 to  1.5 cm in dimension.  Ulcers had an adherent exudate.  There was marked  friability.  Multiple biopsies were taken for the pathologist and also  stool sample was taken.  The patient tolerated the procedure well, was  reacted in endoscopy.   IMPRESSION:  Normal rectum status post subtotal colectomy with surgical  anastomosis to small bowel at 30 cm (remnant of the sigmoid colon  appeared normal) and anastomotic ulcers and neoterminal ileal ulcers as  described above.  Biopsies biopsied.   These findings are highly suspicious for small bowel Crohn's disease.   RECOMMENDATIONS:  Will follow up on path and stool studies with further  recommendations in regards to management in the very near future.      Jonathon Bellows, M.D.  Electronically Signed     RMR/MEDQ  D:  04/13/2009  T:  04/13/2009  Job:  [id number]   cc:   Lia Hopping  Fax: [phone]

## 2011-04-30 NOTE — Group Therapy Note (Signed)
NAME:  [name], [name]               ACCOUNT NO.:  [id number]   MEDICAL RECORD NO.:  [id number]          PATIENT TYPE:  INP   LOCATION:  A321                          FACILITY:  [facility]   PHYSICIAN:  Dorris Singh, DO    DATE OF BIRTH:  October 05, 1961   DATE OF PROCEDURE:  DATE OF DISCHARGE:                                 PROGRESS NOTE   Patient seen today.  She was seen by Dr. Jena Gauss who he has signed off on  her at this point in time.  Also, reviewed her CT of the abdomen which  shows everything is stable except for stable avascular necrosis of the  right hip.  GI has recommended symptomatic treatment of her pain and for  her to follow up in the office in 3 or 4 weeks.  Also, from her  pneumonia, patient is still having some wheezing.  Chest x-ray was  reviewed.  Currently, I feel that one  more day would do her more  benefit than sending her home and patient has agreed.  She was concerned  about going home without any help and feels that one more day will  actually benefit her and she will start to ambulate to see how she feels  once she increases her activity to what it will need to be at home.   Temperature 97.4.  Pulse 86.  Respirations 18.  Blood pressure 114/71.  GENERAL:  She is well developed, well nourished, no acute distress.  HEART:  Regular rate and rhythm.  LUNGS:  Positive expiratory wheezing with some inspiratory wheezing at  the lower bases bilaterally.  ABDOMEN:  Soft, nontender, nondistended.  EXTREMITIES:  Positive pulses.  No edema, ecchymosis, or cyanosis.   LABS:  There are none ordered for today.   ASSESSMENT AND PLAN:  1. Pneumonia.  We will continue with current treatment.  We will      actually increase her steroids due to the persistent wheezing for      24 hours.  I also note this will cause an increase in      hyperglycemia.  We will continue to cover her with sliding scale.      Also, we will continue with her breathing treatments q.4.  2. For her  abdominal pain, we will continue her on a proton pump      inhibitor and treat her symptomatically and have her follow up with      Dr. Jena Gauss as recommended on discharge in 3 to 4 weeks.  3. For stable avascular necrosis which was seen on the CT done on      February 24, 2009, as well as a CT done in November 2009, we will have      her follow up with Dr. Romeo Apple or Hilda Lias at discharge for this to      see what kind of recommendations they will offer.   I suspect she can be discharged tomorrow as long as she remains stable  and patient is in agreement for that.      Dorris Singh, DO  Electronically Signed  CB/MEDQ  D:  02/25/2009  T:  02/25/2009  Job:  [id number]
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
## 2011-04-30 NOTE — H&P (Signed)
NAME:  [name], SHERMAN               ACCOUNT NO.:  [id number]   MEDICAL RECORD NO.:  [id number]          PATIENT TYPE:  INP   LOCATION:  A321                          FACILITY:  [facility]   PHYSICIAN:  Dorris Singh, DO    DATE OF BIRTH:  06-21-1961   DATE OF ADMISSION:  02/21/2009  DATE OF DISCHARGE:  LH                              HISTORY & PHYSICAL   HISTORY OF PRESENT ILLNESS:  The patient is a 50 year old African female  who presented to the Emergency Room with the chief complaint of chest  pain and of cough/wheezing.  She had been treated in the Emergency Room  on February 20, 2009, when she was diagnosed with community-acquired  pneumonia.  She was given antibiotic therapy and then returned today due  to failed outpatient therapy.  At this point in time, it was determined  that we would go ahead and admit the patient.   PAST MEDICAL HISTORY:  Significant for several things:  Asthma,  hypertension, diabetes, GERD, anxiety, chronic back pain, chronic neck  pain, depression, diverticulitis, irritable bowel syndrome, and migraine  headaches.   FAMILY HISTORY:  Coronary artery disease and hypertension.   SURGICAL HISTORY:  She had an appendectomy, cholecystectomy,  hysterectomy, and bowel resection for diverticulitis with a takedown of  colostomy.   SOCIAL HISTORY:  She lives alone.  She is nonsmoker, nondrinker.  No  drug abuse.   ALLERGIES:  DILAUDID causes itching.   CURRENT MEDICATIONS:  1. Xanax 1 mg 3 times a day.  2. Nexium 40 mg twice a day.  3. Advair inhaler.  4. Lotrel 5/1 once a day.  5. Spironolactone 50 mg once a day.  6. Reglan 5 mg 3 times a day.  7. Dicyclomine 20 mg 3 times a day.  8. Potassium chloride 10 mEq once a day.  9. Morphine 60 mg every 12 hours.  10.Combivent 2 puffs as needed.  11.Seroquel 50 mg bedtime.  12.Synthroid 1112 mcg once a day.  13.Diclofenac sodium 75 mg twice a day.  14.Zithromax.  15.She was on prednisone and Diflucan.   LABORATORY DATA:  Her labs are as follows:  Urine - she has trace  hemoglobin, trace ketones, and trace protein.  She has a few bacteria in  her urine.  Her sodium is 136, potassium 5.5, chloride 101, carbon  dioxide 24, glucose 117, BUN 21, creatinine 1.47, and calcium 8.9.  White count 14.9, hemoglobin 0.3, hematocrit 33.8, platelet count  268,000.  She did not have a repeat chest x-ray but yesterday chest x-  ray showed bronchitic changes and lingular infiltrate.   ASSESSMENT/PLAN:  1. Community-acquired pneumonia.  2. Nausea and vomiting.  3. History of diabetes.  4. History of asthma.  5. History of gastroesophageal reflux disease.   PLAN:  Admit the patient to the service of [facility].  We will start her  on antibiotic therapy.  We will get blood work in the morning.  We will  get a chest x-ray on February 23, 2009.  We will also place her on steroids  due to her wheezing.  We will  place her on her home medications.  We  will do DVT and GI prophylaxis.  We will continue to monitor and change  therapy as necessary.      Dorris Singh, DO  Electronically Signed     CB/MEDQ  D:  02/21/2009  T:  02/21/2009  Job:  [id number]

## 2011-04-30 NOTE — Op Note (Signed)
NAME:  [name], [name]               ACCOUNT NO.:  [id number]   MEDICAL RECORD NO.:  [id number]          PATIENT TYPE:  INP   LOCATION:  A321                          FACILITY:  [facility]   PHYSICIAN:  R. Roetta Sessions, M.D. DATE OF BIRTH:  August 19, 1961   DATE OF PROCEDURE:  02/23/2009  DATE OF DISCHARGE:                               OPERATIVE REPORT   PROCEDURE PERFORMED:  Diagnostic esophagogastroduodenoscopy.   INDICATIONS FOR PROCEDURE:  A 50 year old lady with chronic abdominal  pain, early satiety, reflux symptoms, some esophageal dysphagia.  She  takes nonsteroidal agents.  She had been recently treated for pneumonia.  EGD now being done.  Potential risks, benefits and was limitations have  been reviewed.  Potential for esophageal dilation reviewed with Ms.  Wilson.  Questions answered.  Please see documentation in the medical  record.   PROCEDURE NOTE:  O2 saturation, blood pressure, pulse, respirations  monitored throughout the entire procedure.   CONSCIOUS SEDATION:  Versed 8 mg IV, Demerol 125 mg IV in divided doses.  Phenergan 25 mg dilute slow IV push to augment conscious sedation.  Cetacaine spray for topical pharyngeal anesthesia.   FINDINGS:  Examination of tubular esophagus revealed tiny distal  circumferential erosions just above the EG junction.  There was no ring  stricture, web or other abnormality.  The tubular esophagus was widely  patent through the EG junction.  Stomach:  Gastric cavity was empty.  It  insufflated well with air.  Thorough examination of the gastric mucosa  including retroflex in proximal stomach and esophagogastric junction  demonstrated a couple of tiny fundal gland type polyps and a hiatal  hernia.  Gastric mucosa otherwise appeared normal.  Pylorus was widely  patent, easily traversed.  Examination of the bulb and second portion  revealed no abnormalities.   THERAPEUTIC/DIAGNOSTIC MANEUVERS PERFORMED:  None.   The patient tolerated the  procedure well, was reacted in endoscopy.   IMPRESSION:  Tiny distal esophageal erosions consistent with mild  erosive reflux esophagitis, otherwise normal esophagus.  Hiatal hernia.  Tiny fundal gland polyps not manipulated.  Otherwise normal stomach, D1  and D2.   RECOMMENDATIONS:  1. Continue Protonix 40 grams orally twice daily.  2. Advance diet.  3. Resume Lovenox.  4. Would recommend continuation of workup of abdominal pain as an      outpatient.  Ultimately she may benefit from a contrast CT once her      renal function normalizes.      Jonathon Bellows, M.D.  Electronically Signed     RMR/MEDQ  D:  02/23/2009  T:  02/23/2009  Job:  [id number]   cc:   hospitalist team   Lia Hopping  Fax: [phone]

## 2011-04-30 NOTE — Discharge Summary (Signed)
NAME:  [name], [name]               ACCOUNT NO.:  [id number]   MEDICAL RECORD NO.:  [id number]          PATIENT TYPE:  INP   LOCATION:  A321                          FACILITY:  [facility]   PHYSICIAN:  Renee Ramus, MD       DATE OF BIRTH:  [birth year]   DATE OF ADMISSION:  02/21/2009  DATE OF DISCHARGE:  LH                               DISCHARGE SUMMARY   PRIMARY CARE PHYSICIAN:  Vickki Hearing, M.D.   DISCHARGE DIAGNOSIS:  Community acquired pneumonia.   SECONDARY DIAGNOSES:  1. Anemia.  2. COPD.  3. Asthma.  4. Hypertension.  5. Diabetes mellitus type 2.  6. Anxiety.  7. Chronic neck and back pain.  8. Depression.  9. Irritable bowel syndrome.  10.Hypothyroid.  11.Gastroesophageal reflux disease.  12.Obesity.   HOSPITAL COURSE:  1. Community acquired pneumonia.  The patient is a 50 year old female      who was admitted after failing outpatient antibiotic therapy for      community acquired pneumonia.  The patient was placed on Rocephin      and azithromycin.  She has done well.  She is clinically stable.      This is a dictation anticipation of discharge in a.m. to home.  The      patient will be continued on ciprofloxacin times 5 days      postdischarge with instructions to follow up with primary care      physician if needed.  2. Anemia.  The patient remains anemic, however iron studies indicate      adequate iron for erythropoiesis and so iron supplements are not      recommended at this time.  The patient may have renal insufficiency      and this may be causing a decrease in erythropoetin, but she is not      a candidate for EPO therapy at this time.  3. COPD.  This is stable.  The patient is on Advair and will continue      this postdischarge.  4. Asthma.  The patient currently has no evidence of asthma      exacerbation.  She is not requiring supplemental oxygen.  She has      no wheezes, and she is not currently on steroids, so we will do no      interventions  at this time.  5. Hypertension, currently well controlled.  6. Diabetes mellitus type 2, likely a result of chronic prednisone or      steroid use.  Her hemoglobin A1c was 6.3 on 02/22/2009.  I do not      believe she requires medication at this time.  7. Anxiety, currently stable.  The patient will continue Xanax      therapy.  8. Chronic neck and back pain.  The patient will continue outpatient      pain medication for this.  9. Depression.  The patient continue Seroquel.  10.Irritable bowel syndrome, currently stable.  11.Hypothyroid.  The patient is therapeutic on her current thyroxine      dosage.  12.Gastroesophageal reflux disease.  The  patient will continue proton      pump inhibitor.   DISPOSITION:  Hopeful for discharge in the a.m.  This is a dictation in  anticipation of discharge in a.m.   LABORATORIES OF NOTE:  1. The patient has a persistent elevated white blood cell count      currently at 14.9.  This, however, may be the result of large      amounts of steroids.  The patient does not have clinical signs or      symptoms of infection.  2. Anemia with hemoglobin of 10.5, hematocrit of 31.50, but with      normal MCV of 90.1.  3. Elevated blood glucose ranging between low of 131 and a high of 208      in the setting of steroid administration.  4. Elevated creatinine with 1.25 with baseline in that range, some are      between 1.10 and 1.30.  5. Calcium 8.3.  6. Iron studies showing total serum iron of 58 with a ferritin of 53,      but with a normal binding capacity and slightly decreased percent      saturation.  7. Hemoglobin A1c of 6.3.   STUDIES:  1. CT abdomen pelvis from abdominal pain showing bibasilar      subsegmental atelectasis and prior colon resection, and she had a      stable avascular necrosis of the right hip along with borderline      wall thickening of the distal small bowel at the anastomotic site      to the colon.  2. Chest x-ray showing  some subsegmental atelectasis in the lingula      and some faint residual lingular air disease.  Initial chest x-ray      showed bronchitic changes in the lingular infiltrate which was      somewhat more dramatic.   MEDICATIONS ON DISCHARGE:  1. Xanax 1 mg p.o. t.i.d.  2. Nexium 40 mg p.o. b.i.d.  3. Advair 250/50 one inhalation. We are asking the patient to take      this b.i.d. her initial frequency was as needed.  4. Lotrel 5/10 one tablet p.o. daily.  5. Spironolactone 50 mg p.o. t.i.d. which were asking had a decrease      to 50 mg p.o. b.i.d., given her dehydration.  6. Dicyclomine 20 mg p.o. t.i.d.  7. Potassium 10 mEq p.o. b.i.d.  8. Morphine delayed release 16 mg p.o. q.12 h.  9. Combivent 1-2 inhalations q.6 h p.r.n.  10.Seroquel 50 mg p.o. q.h.s.  11.Synthroid 112 mcg p.o. daily.  12.Diclofenac sodium 75 mg p.o. b.i.d.  13.Ciprofloxacin 500 mg p.o. b.i.d. times 5 days.  The patient will      receive labs in a.m.  No further studies have been ordered.  The      patient is in stable condition and ready for discharge in a.m.   Time spent 35 minutes.      Renee Ramus, MD  Electronically Signed     JF/MEDQ  D:  02/27/2009  T:  02/28/2009  Job:  [id number]   cc:   Vickki Hearing, M.D.  Fax: [phone]

## 2011-04-30 NOTE — Group Therapy Note (Signed)
NAME:  [name], [name]               ACCOUNT NO.:  [id number]   MEDICAL RECORD NO.:  [id number]          PATIENT TYPE:  INP   LOCATION:  A321                          FACILITY:  [facility]   PHYSICIAN:  Dorris Singh, DO    DATE OF BIRTH:  1961/05/30   DATE OF PROCEDURE:  02/22/2009  DATE OF DISCHARGE:                                 PROGRESS NOTE   Patient is seen today, complaining of abdominal pain.  Apparently she  had made an appointment with Dr. Jena Gauss for this Friday and had  experienced this abdominal pain for several weeks and has had pain off  and on for several years.  She saw her primary care physician and her  OB/GYN, Dr. Olena Leatherwood and Dr. Emelda Fear, respectively, who eventually  referred her to Dr. Luvenia Starch office.  She is still having nausea and  vomiting.  We increased her diet and that actually made her feel worse  so we will decrease her back to clear liquids and consult GI to see her.  She says she is having diarrhea.  We will go ahead and stool-culture her  as well.   VITAL SIGNS:  Temperature 97.5, pulse 73, respirations 20, blood  pressure 100/62.  GENERAL:  The patient is well-developed, well-nourished in no acute  distress.  HEART:  Regular rhythm.  LUNGS:  Inspiratory and expiratory wheezes bilaterally.  ABDOMEN:  Positive diffuse tenderness on palpation, generalized  throughout.  EXTREMITIES:  Positive pulses.  No edema, ecchymosis or cyanosis.   Her ABG shows her O2 at 77.6.  Everything else is within normal limits.  Her white count is 10.8, hemoglobin 11.1, hematocrit 32.9, platelet  count of 256.  Her sodium is 140, potassium 4.4, her creatinine is 1.30  which is improved from yesterday, and her AST and ALT are within normal  limits.   ASSESSMENT/PLAN:  1. Community-acquired pneumonia:  We will continue the patient on the      current medication regimen.  She is doing a lot of wheezing.  She      has been started on Solu-Medrol, so this also may affect her  white      count.  We will keep an eye on that.  We recommended she stay on O2      to keep her sats above 90%.  2. Abdominal pain:  The patient states this is a continued problem      that has continued to get worse.  We will go ahead and consult GI      for an evaluation.  We will continue to monitor her and change any      therapy as necessary.  3. Renal failure, acute on chronic.  Her creatinine is coming down.      We will continue with IV fluids.  4. Anemia.  We will continue to monitor this, as well.      Dorris Singh, DO  Electronically Signed     CB/MEDQ  D:  02/22/2009  T:  02/22/2009  Job:  [id number]

## 2011-05-03 NOTE — H&P (Signed)
[name], [name]               ACCOUNT NO.:  [id number]   MEDICAL RECORD NO.:  [id number]          PATIENT TYPE:  INP   LOCATION:  A331                          FACILITY:  [facility]   PHYSICIAN:  Dirk Dress. Katrinka Blazing, M.D.   DATE OF BIRTH:  December 20, 1960   DATE OF ADMISSION:  12/13/2005  DATE OF DISCHARGE:  LH                                HISTORY & PHYSICAL   This is one of multiple admissions for this 49 year old female with the  complaint of recurrent nausea, vomiting, and migraine headaches.  The  patient states that she has had difficulty swallowing food.  She is having  increasing abdominal pain.  She complains that food has been hanging up in  her throat.  She has had episodic nausea with vomiting and has been unable  to keep down any medicines.  Because of this, she has had worsening diffuse  body pains.  She also states that her right leg gives away, and she has  falls.  She also has what she described as cramps and increased stiffness.  She has no warning of her falls.  Sometimes, her bilateral lower legs give  out.  She has not injured herself.  She has not had loss of consciousness.  The patient also complains of increasing dyspnea on exertion with worsening  shortness of breath.  She states that she can only walk about one fourth of  a block before she has extreme dyspnea.  She has also noted that she has had  increasing weight gain, but she has not had worsening peripheral edema.  She  has continued pain in the right lower quadrant over her old ileostomy scar.  She also complains of foot pain and muscle pain.  Because of this  constellation of symptoms, the patient is admitted and will try to get  control of her pain, treat her nausea and vomiting symptomatically, and will  get a neurology evaluation.   PAST HISTORY:  She has a history of chronic abdominal pain after a severe  bout of diverticulitis which led to subtotal colectomy with ileostomy and  later with ileostomy closure  with an ileoproctostomy.  She has had worsening  abdominal pain since that time.  She has developed chronic narcotic  dependence and chronic pain syndrome.  Other problems include:  1.  Hypertension.  2.  Depression.  3.  Migraine headaches.  4.  Episodic diarrhea.  5.  Gastroesophageal reflux disease.   SURGERIES:  1.  Cholecystectomy.  2.  Subtotal colectomy with ileostomy.  3.  Ileostomy closure.  4.  Umbilical hernia repair.  5.  Total abdominal hysterectomy.   MEDICATIONS:  1.  Methadone 20 mg t.i.d.  2.  Oxycodone 20 mg t.i.d.  3.  Bentyl 20 mg q.i.d.  4.  Xanax 1 mg t.i.d.  5.  Synthroid 100 mcg daily.  6.  Phenergan 25 mg 4 times daily as needed.  7.  Potassium chloride 20 mEq 3 to 4 times daily.  8.  Zelnorm 6 mg twice daily.  9.  Reglan 10 mg before meals and at bedtime.  10.  Prevacid 30 mg daily.  11. Zoloft 150 mg daily.  12. Requip 2 mg at bedtime.  13. Cymbalta 60 mg daily.  14. Demadex 20 mg 2 twice daily.   REVIEW OF SYSTEMS:  Positive for chronic abdominal pain, chronic depression,  recurrent headaches, diffuse leg pain, chronic peripheral edema, and  increasing dyspnea on exertion.   On examination, the patient appears to be in moderate distress.  Blood  pressure is 93/51, pulse 79, respirations 16, temperature 98.2.  HEENT is  unremarkable.  Neck is supple.  No JVD, bruit, adenopathy, or thyromegaly.  Chest is clear to auscultation.  No rales, rubs, rhonchi, or wheezes.  She  has good air movement.  Heart a regular rate and rhythm without murmur,  gallop or rub.  Abdomen is soft with moderate tenderness in the epigastrium,  right lower quadrant, and left lower quadrant.  Abdomen distended, diffusely  tender with hyperactive bowel sounds.  Extremities with 2+ peripheral edema  pitting type diffuse.  Tenderness at the calf of her legs, but she also has  tenderness of the soft tissue of her thighs and her arms.  On neurologic  exam, no focal motor,  sensory, or cerebellar deficit.  She does appear to be  slightly hyperreflexic bilaterally.   IMPRESSION:  1.  Recurrent episodes of nausea and vomiting, probably an exacerbation of      irritable bowel syndrome.  2.  Increasing dyspnea, etiology undetermined.  To be further evaluated.      Must rule out hypoxemia.  3.  Recurrent syncope or near syncope episodes associated with what appears      to be myoclonic-type jerks and possibly motor weakness of her lower      extremities, etiology undetermined.  4.  Depression.  5.  Chronic hypokalemia.  6.  Gastroesophageal reflux disease.  7.  New-onset diabetes mellitus.  8.  Hypertension, controlled.  9.  Migraine headaches.   PLAN:  The patient is admitted.  She will be treated symptomatically for  nausea and vomiting.  Once she can tolerate the p.o. meds, will reinstitute  her oral medications.  Will get neurology to evaluate the patient to make  sure we are not missing any acute neurologic event.  Once these matters are  stabilized, she will be discharged and will complete her workup as an  outpatient.      Dirk Dress. Katrinka Blazing, M.D.  Electronically Signed     LCS/MEDQ  D:  12/19/2005  T:  12/20/2005  Job:  [id number]

## 2011-05-03 NOTE — Op Note (Signed)
[place]  Patient:    ANE, CONERLY Visit Number: [id number] MRN: [id number]          Service Type: DSU Location: [facility] Attending Physician:  Susy Frizzle Dictated by:   Jeannett Senior Pollyann Kennedy, M.D. Proc. Date: 11/02/01 Admit Date:  11/02/2001                             Operative Report  PREOPERATIVE DIAGNOSES: 1. Right ethmoid mucocele. 2. Right middle and inferior turbinate hypertrophy.  POSTOPERATIVE DIAGNOSES: 1. Right ethmoid mucocele. 2. Right middle and inferior turbinate hypertrophy.  PROCEDURES: 1. Right endoscopic total ethmoidectomy. 2. InstaTrak image guidance system use. 3. Submucous resection, right inferior turbinate. 4. Partial excision of right middle turbinate.  SURGEON:  Jefry H. Pollyann Kennedy, M.D.  ANESTHESIA:  General endotracheal anesthesia.  COMPLICATIONS:  None.  ESTIMATED BLOOD LOSS:  50 cc.  REFERRING PHYSICIAN:  Karleen Hampshire, M.D.  HISTORY:  The patient is a 50 year old lady with a history of chronic severe deep-seated retro-orbital and right facial and headache that has not responded to antibiotic therapy.  She also complains of pressure in the eye and occasional visual disturbance in the right eye.  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  Risks, benefits, alternatives, complications of the procedure were explained to the patient, who seemed to understand and agreed to surgery.  DESCRIPTION OF PROCEDURE:  The patient was taken to the operating room and placed on the operating table in the supine position.  Following induction of general endotracheal anesthesia, the patient was prepped and draped in a standard fashion.  The InstaTrak headgear was placed into position.  The straight suction probe was calibrated and positioning was verified in  the appropriate manner.  Oxymetazoline spray was used preoperatively in the nose. Xylocaine 1% with epinephrine was infiltrated into the septum, the inferior turbinates, the superior and posterior attachment of the middle turbinate, and the face of the sphenoid sinus.  Using InstaTrak image guidance, the middle meatus area was examined.  Submucous resection of the right inferior turbinate was performed as there was minimal room to work in the right nasal cavity. Large fragment of bony turbinate was resected.  An anterior vertical incision was created in the turbinate for access and for dissection of the submucosal plane.  The turbinate remnant was outfractured using an an Water quality scientist.  The right middle turbinate was thickened and enlarged and completely obscured the infundibular area.  The anterior and inferior aspect was resected using through-cut forceps.  The bone was severely thickened.  There was no concha bullosa present.  A total ethmoidectomy was performed using the microdebrider and 0 and 30 degree nasal endoscopes.  The InstaTrak system was used to confirm the positioning of the cribriform and the fovea ethmoidalis.  The posterior ethmoid cell that was opacified was identified.  It had a thick anterior wall that was taken down using a straight suction.  Within was thickened, inflamed mucosal tissue and a large amount of thick, white mucoid matter.  The lining was sent for pathologic evaluation separate from the rest of the specimen. The complete sinus was decompressed.  It seemed to be expansile and extended slightly across the midline and posterior back inside the sphenoid sinus.  All the overhanging ledges were taken down using straight Genevie Ann  forceps and suctioned.  After having cleaned all of this out and confirming that the entire lesion was decompressed using the InstaTrak, the nasal cavities were suctioned of blood and secretions, irrigated with saline, and packed  with a small sinus Kennedy pack.  The pharynx was suctioned of blood and secretions. The patient was then awakened, extubated, and transferred to the recovery room. Dictated by:   Jeannett Senior Pollyann Kennedy, M.D. Attending Physician:  Susy Frizzle DD:  11/02/01 TD:  11/02/01 Job: [id number]

## 2011-05-03 NOTE — Discharge Summary (Signed)
NAME:  [name], [name]                           ACCOUNT NO.:  [id number]   MEDICAL RECORD NO.:  [id number]                   PATIENT TYPE:  INP   LOCATION:  A203                                 FACILITY:  [facility]   PHYSICIAN:  Dirk Dress. Katrinka Blazing, M.D.                DATE OF BIRTH:  1961-04-13   DATE OF ADMISSION:  07/13/2003  DATE OF DISCHARGE:  08/02/2003                                 DISCHARGE SUMMARY   DISCHARGE DIAGNOSES:  1. Acute diverticulitis with retroperitoneal abscess.  2. Sepsis due to #1.  3. Diabetes mellitus, new onset.  4. Hypokalemia.  5. Anxiety disorder.  6. Anemia of chronic disease.   SPECIAL PROCEDURE:  On July 19, 2003, subtotal colectomy with ileostomy and  Hartmann's pouch and central venous catheter placement.   DISPOSITION:  The patient is discharged home in stable and satisfactory  condition.  Home health services have been coordinated and she will receive  this.   DISCHARGE MEDICATIONS:  1. Demerol 50 mg one to two every four hours as needed for pain.  2. Reglan 10 mg a.c. and h.s.   FOLLOWUP:  She will be seen in the office two weeks post discharge.   SUMMARY:  [age]-year-old female admitted with an acute abdomen which,  on further evaluation, revealed acute diverticulitis.  She was admitted  through the emergency room, where she was noted to be tachycardic with a  blood pressure of 90/60.  CT of the abdomen revealed acute diverticulitis  involving the left colon.  She was treated with Zosyn, Flagyl and  gentamicin.  She was noted to have hyperglycemia and on further evaluation,  it was confirmed that she had diabetes mellitus which was new onset,  probably exacerbated by her septicemia.  The patient was treated but did not  make significant improvement.  Followup CT of the abdomen showed increased  retroperitoneal inflammation and a large left gutter abscess.  The patient  was explored on 19 July 2003 and was found to have extensive  diverticulitis  with involvement of the distal transverse, descending and proximal sigmoid  colon.  Subtotal colectomy with ileostomy and Hartmann's pouch were done.  A  central venous catheter was started at that time.  Her abdomen was drained  as outlined in the operative note.  The patient was started on TPN  postoperatively.  She had a course of slow but steady improvement.  She had  a gradual drop in her hemoglobin and required transfusion.  She tolerated  the transfusion very well.  She had return of intestinal function by the 8th  of August and diet was started.  After she tolerated her diet, TPN was  tapered.  She had no further problems.  Her  wound healed uneventfully.  Her stoma functioned without difficulty.  Diabetes was not a difficulty during this admission.  She was finally  discharged home in satisfactory condition, with  plans for close followup as  an outpatient and with plans for subsequent closure of ileostomy.     ___________________________________________                                         Dirk Dress Katrinka Blazing, M.D.   LCS/MEDQ  D:  10/08/2003  T:  10/09/2003  Job:  [id number]   cc:   Madelin Rear. Sherwood Gambler, M.D.  [address]  [place]  [place] [address]  Fax: [phone]

## 2011-05-03 NOTE — Discharge Summary (Signed)
NAME:  [name], [name]               ACCOUNT NO.:  [id number]   MEDICAL RECORD NO.:  [id number]          PATIENT TYPE:  INP   LOCATION:  A318                          FACILITY:  [facility]   PHYSICIAN:  Dirk Dress. Katrinka Blazing, M.D.   DATE OF BIRTH:  1961/05/16   DATE OF ADMISSION:  04/09/2006  DATE OF DISCHARGE:  04/29/2007LH                                 DISCHARGE SUMMARY   DISCHARGE DIAGNOSES:  1.  Irritable bowel syndrome exacerbation.  2.  Gastroesophageal reflux disease.  3.  Severe depression.  4.  Chronic obstructive lung disease.  5.  Right heart failure.  6.  Hypertension.  7.  Recurrent migraine headaches.   DISPOSITION:  The patient was discharged home in stable, satisfactory  condition.   DISCHARGE MEDICATIONS:  1.  MiraLax 1 cap in 8 ounces of  liquid 2-3 times daily.  2.  Potassium chloride 20 mEq twice daily.  3.  Reglan 10 mg before meals and at bedtime.  4.  Oxycodone 20 mg twice daily.  5.  Wellbutrin 150 mg daily.  6.  Lotrel 5/20 daily.  7.  She is to continue all of her other medications as before admission.   SUMMARY:  This is a 50 year old female with chronic, severe abdominal pain  who has been followed serially in the office with recurrent episodes of pain  with nausea and vomiting and progressive abdominal distension.  There was  some consideration for possible intestinal obstruction.  She was admitted.  Plans were made for a Gastrografin barium enema followed by GoLYTELY if the  enema did not show any obstruction.  After the Gastrografin barium enema.  The patient did exceptionally well.  The barium enema showed no evidence of  an anastomotic obstruction or small bowel obstruction.  She tolerated  liquids thereafter.  She still complained of pain, but she was able to keep  down food.  Over the next 2 days, she had multiple large bowel movements.  By the 28th she was tolerating a regular diet.  Her abdominal distension was  less.  She remained stable and was  discharged home on the 29th asymptomatic  with the ability to eat and having regular bowel movements.     Dirk Dress. Katrinka Blazing, M.D.  Electronically Signed    LCS/MEDQ  D:  06/15/2006  T:  06/15/2006  Job:  [id number]

## 2011-05-03 NOTE — H&P (Signed)
   NAME:  [name], [name] NO.:  [id number]   MEDICAL RECORD NO.:  [id number]                  PATIENT TYPE:   LOCATION:                                       FACILITY:   PHYSICIAN:  Dalia Heading, M.D.               DATE OF BIRTH:  Aug 15, 1961   DATE OF ADMISSION:  DATE OF DISCHARGE:                                HISTORY & PHYSICAL   CHIEF COMPLAINT:  Dysphagia, GERD.   HISTORY OF PRESENT ILLNESS:  Patient is a 50 year old black female who is  referred for an EGD.  She has been having dysphagia and GERD symptoms for  many years.  She has been on proton pump inhibitor medication in the past,  but recently her symptoms have been worsening.  She was serology positive  for Helicobacter pylori and is about to start antibiotics for treatment.  She denies any lightheadedness, weight loss, fever, constipation, diarrhea,  hematochezia, or melena.  She denies hemorrhoidal problems.   PAST MEDICAL HISTORY:  Includes depression.   PAST SURGICAL HISTORY:  Vaginal hysterectomy.   CURRENT MEDICATIONS:  1. Xanax.  2. Nexium.  3. Lexapro.  4. Reglan.   ALLERGIES:  No know drug allergies.   REVIEW OF SYSTEMS:  She does smoke on occasion.  She denies alcohol use.  She denies any other cardiopulmonary difficulties or bleeding disorders.   PHYSICAL EXAMINATION:  GENERAL:  Patient is a well-developed, well-nourished  black female in no acute distress.  VITAL SIGNS:  She is afebrile, and vital signs are stable.  LUNGS:  Clear to auscultation with equal breath sounds bilaterally.  HEART EXAMINATION:  Reveals a regular rate and rhythm without S3, S4, or  murmurs.  ABDOMEN:  Soft, nontender, nondistended.  No hepatosplenomegaly or masses  are noted.   IMPRESSION:  1. Dysphagia.  2. Gastroesophageal reflux disease.    PLAN:  The patient is scheduled for an EGD on 04/11/2003.  The risks and  benefits of the procedure, including bleeding and perforation, were  fully  explained to the patient, gaining informed consent.                                               Dalia Heading, M.D.    MAJ/MEDQ  D:  04/07/2003  T:  04/07/2003  Job:  [id number]   cc:   Madelin Rear. Sherwood Gambler, M.D.  [address]  [place]  [place] [address]  Fax: [phone]

## 2011-05-03 NOTE — H&P (Signed)
NAME:  [name], [name]                           ACCOUNT NO.:  [id number]   MEDICAL RECORD NO.:  [id number]                   PATIENT TYPE:  INP   LOCATION:  A303                                 FACILITY:  [facility]   PHYSICIAN:  Dirk Dress. Katrinka Blazing, M.D.                DATE OF BIRTH:  1961/04/06   DATE OF ADMISSION:  07/13/2003  DATE OF DISCHARGE:                                HISTORY & PHYSICAL   HISTORY OF PRESENT ILLNESS:  The patient is a 50 year old woman admitted  with acute abdomen.  The patient states that she started having lower  abdominal pain on Friday of last week.  The pain started in the left lower  quadrant and gradually progressed to her back.  It became much worse over  the weekend.  She was seen in the emergency room at [facility] on  Monday and was evaluated for these symptoms.  At that time, she was having  abdominal pain, nausea, vomiting, fever, and chills.  She was told that she  had a urinary tract infection and was started on Bactrim and Pyridium.  Upon  returning home, the patient's pain became worse; the nausea and vomiting  became worse, and the pain in her back became excruciating.  She contacted  her primary care physician who advised her to come to the emergency room.   When seen in the emergency room, she was thought to be in acute distress  with temperature of 101.5, tachycardia, mild hypotension with blood pressure  of 90/60, and clinical evidence of an acute abdomen as well as CT evidence  of acute diverticulitis involving the sigmoid colon.  The patient was  admitted for treatment.   PAST MEDICAL HISTORY:  1. Known hypertension.  2. Anxiety disorder.   MEDICATIONS:  She has been treated with Catapres and Lexapro.  The doses are  unknown at this time.   PAST SURGICAL HISTORY:  1. Hysterectomy.  2. Umbilical hernia repair.   REVIEW OF SYSTEMS:  She has no other major illnesses that are known, but it  is noted on her admission labs that she  has blood sugar of 175 with positive  glucosuria and positive ketones.  It is felt that she is probably a new  onset diabetic, and this will need to be addressed.   SOCIAL HISTORY:  She is employed at the [employer] facility.  She smokes a pack a day, and she drinks alcohol socially.   ALLERGIES:  No known drug allergies.   PHYSICAL EXAMINATION:  VITAL SIGNS:  Blood pressure 100/70, pulse 130,  respirations 26, temperature 101.5.  HEENT:  Unremarkable.  NECK:  Supple.  No JVD, bruit, adenopathy, or thyromegaly.  CHEST:  Clear to auscultation.  No rales, rubs, rhonchi, or wheezes.  HEART:  Resting tachycardia.  No murmur or gallop.  ABDOMEN:  Distended with moderate tenderness with guarding diffusely in the  left lower quadrant.  She also has some suprapubic tenderness.  She has good  active bowel sounds at this time.  EXTREMITIES:  No cyanosis, clubbing, or edema.  No joint deformity.  Full  peripheral pulses.  NEUROLOGIC:  No motor, sensory, or cerebellar deficits.   LABORATORY DATA:  White count 11,000, hemoglobin 12, hematocrit 35.6 with 87  Segs and 8 lymphs.  Sodium 131, potassium 2.9, BUN 8, creatinine 0.9, serum  glucose 175 mg/dl.  Urinalysis positive urine glucose with moderate blood,  trace ketones, positive nitrites, negative esterase, and only 0 to 2 white  cells.   CT of the abdomen shows sigmoid colon diverticulitis with extravasation of  air along the anterior perineal space without free retroperitoneal air and  no abscess formation.   IMPRESSION:  1. Acute diverticulitis with retroperitoneal perforation.  2. Hypotension in patient with prior history of hypertension, probably     related to volume contracture as well as antihypertensive medications.  3. Hypokalemia.  4. New onset diabetes mellitus.  5. Anxiety disorder.   PLAN:  The patient will be admitted.  She will be n.p.o. except for sips of  liquids.  Will control her nausea with  Phenergan.  She will be hydrated with  normal saline with potassium.  Will start her on triple drop therapy  consisting of Zosyn, Flagyl, and gentamycin.  Hemoglobin A1C will be ordered  and will do serial Accu-Chek's.  Pain will be controlled with PCA Demerol.  As she improves, will get a CT followup of her abdomen on Monday.  If she  worsens, she will need exploratory laparotomy with primary resection,  colostomy, with delayed colostomy closure.                                               Dirk Dress. Katrinka Blazing, M.D.    LCS/MEDQ  D:  07/13/2003  T:  07/13/2003  Job:  [id number]   cc:   Kirk Ruths, M.D.  [address]  [place]  [place] [address]  Fax: [phone]

## 2011-05-03 NOTE — Discharge Summary (Signed)
NAME:  [name], [name]               ACCOUNT NO.:  [id number]   MEDICAL RECORD NO.:  [id number]          PATIENT TYPE:  INP   LOCATION:  A303                          FACILITY:  [facility]   PHYSICIAN:  Dirk Dress. Katrinka Blazing, M.D.   DATE OF BIRTH:  05-Mar-1961   DATE OF ADMISSION:  06/10/2005  DATE OF DISCHARGE:  06/29/2006LH                                 DISCHARGE SUMMARY   DISCHARGE DIAGNOSES:  1.  Progressive dyspnea with peripheral edema, etiology undetermined.  2.  Chronic soft tissue pain with exacerbation.  3.  Hypertension.  4.  Depression.  5.  Chronic migraine headaches   DISPOSITION:  The patient discharged home in stable improved condition.  She  is advised to hold the Lotrel and Inderal until she returns to the office.   She is continued on:  1.  Lasix 40 mg b.i.d.  2.  Aldactone 25 mg b.i.d.  3.  Potassium chloride 20 mEq b.i.d.  4.  Synthroid 0.1 mg q.d.  5.  Phenergan 25 mg, two every 4 hours as needed.  6.  Demerol 50 mg, four times daily.  7.  Imipramine 75 mg at bedtime.  8.  Requip 1 mg, 1-2 tablets one hour before bedtime.   She will be seen in the office 1 week post discharge.   SUMMARY:  A 50 year old female admitted for evaluation of increased  peripheral edema, worsening dyspnea on exertion, and shortness of breath.  She has had worsening leg edema since early May.  She was on Lasix 40 mg a  day and was apparently improving.  She was seen on June 10, 2005, and she  was complaining worsening swelling, inability to wear shoes, with increasing  fatigue, dyspnea on exertion, and shortness of breath.  She denied chest  pain.  It was felt that the patient needed to be admitted and monitored for  possible heart failure.  We also needed to rule out renal disease and  thyroid disease.   PHYSICAL EXAMINATION:  VITAL SIGNS:  Stable.  Blood pressure was 104/68,  pulse 92, respirations 20.  NECK:  There was no JVD.  LUNGS:  Clear.  HEART:  Regular.  ABDOMEN:  Revealed  diffuse tenderness but was nondistended.  She had  hyperactive bowel sounds.  EXTREMITIES:  She had 4+ edema of her feet and lower legs, 3+ edema of the  legs up to her knees.  She had diffuse tenderness of her legs and feet  without cellulitis.   The patient was admitted, started on IV Lasix, and given potassium.  Echocardiogram and thyroid functions were done.  It was felt that she needed  to have a stress test done.  Echocardiogram revealed mild was concentric  left ventricular hypertrophy with ejection fraction of 55-60% without any  major valvular __________  , but with a dilated right ventricle and  depressed right ventricular systolic function.  This may very well be the  reason for her peripheral edema.  Thyroid functions revealed a TSH of 0.476,  T4 of 0.87, free T3 of 2.8 which suggests mild hypothyroidism.  BUN and  creatinine were normal.  Liver function studies were normal.  Urinalysis was  a contaminated specimen with multiple species.  The patient responded to  diuresis.  She had poor control of her pain, and she also had significant  heartburn.  By June 12, 2005, she was stable.  The edema had resolved.  Her  generalized pain persisted and she was mildly hypotensive, so her  antihypertensive medications were withheld.  BUN, creatinine were normal.  Potassium was normal.  By June 13, 2005, the patient was felt to be stable  enough for discharge, and she was discharged home in satisfactory condition.  Thank you.      Dirk Dress. Katrinka Blazing, M.D.  Electronically Signed     LCS/MEDQ  D:  07/30/2005  T:  07/30/2005  Job:  [id number]

## 2011-05-03 NOTE — Discharge Summary (Signed)
[name], [name]                           ACCOUNT NO.:  [id number]   MEDICAL RECORD NO.:  [id number]                   PATIENT TYPE:  INP   LOCATION:  A322                                 FACILITY:  [facility]   PHYSICIAN:  Dirk Dress. Katrinka Blazing, M.D.                DATE OF BIRTH:  Jul 01, 1961   DATE OF ADMISSION:  02/29/2004  DATE OF DISCHARGE:  03/05/2004                                 DISCHARGE SUMMARY   DISCHARGE DIAGNOSES:  1. Irritable bowel syndrome with acute exacerbation.  2. Recurrent severe vascular type headaches, migraine headaches suggested.  3. Hypertension.  4. Anxiety and depression.  5. Chronic anemia.  6. Gastroesophageal reflux disease.   DISPOSITION:  The patient is discharged home in stable satisfactory  condition.   DISCHARGE MEDICATIONS:  1. Inderal 40 mg b.i.d.  2. Prevacid 30 mg b.i.d.  3. Xanax 1 mg t.i.d.  4. Dilaudid 4 mg q.12h. p.r.n.  5. Lexapro 10 mg daily.  6. Levsin 25 mg a.c. and h.s.  7. Ambien 10 mg q.h.s.  8. Fioricet two tabs q.4h. p.r.n. headache.  9. Phenergan 25 mg q.4h. p.r.n. nausea.  10.      Robaxin 500 mg b.i.d. x5 days.  11.      ____________b.i.d. x5 days.  She is advised to stop taking the InnoPran.   FOLLOWUP:  She is scheduled to be seen in the office 10 days post discharge.   HISTORY:  A 50 year old female with known diverticular disease with a  history of increasing recurrent persistent abdominal pain.  The pain was  worse in the right lower quadrant.  She had nausea with vomiting, fever,  chills, crampy pain.  She also had postprandial diarrhea.  She was followed  in the office for a month with continued symptoms.  She seems to be in more  severe distress due to the nausea and abdominal pain, and she looked much  worse clinically.  She was admitted for further evaluation and symptomatic  treatment.   PHYSICAL EXAMINATION:  VITAL SIGNS:  At the time of admission, vital signs  were stable.  HEENT:  Head exam revealed  tenderness of her scalp in the temporal area  bilaterally with some tenderness in the occipital area of her scalp.  She  had tenderness with spasm of the posterior cervical muscles.  LUNGS:  Clear.  ABDOMEN:  Prominent tenderness in the lower abdomen, especially over the  ileostomy scar in the right lower quadrant.  She had hyperactive bowel  sounds.  EXTREMITIES:  Tenderness of calves and thighs bilaterally with tenderness  along the lesser saphenous system bilaterally from the groin down to the  ankle.  Venous cords could not be palpated.  She had no peripheral edema.  She had some tenderness of her joints, but there was no joint deformity, no  crepitus, no decreased range of motion, and no effusions.   HOSPITAL COURSE:  She was admitted.  Because of the possibility of  infection, a packed white cell scan was ordered.  CT scan of the head was  normal.  Packed white cell scan was negative for infectious process.  The  patient was therefore treated symptomatically.  Her medications were  adjusted.  Her nasopharyngeal congestion resolved.  By March 05, 2004, it  was felt that she was stable enough for discharge, though she continued to  have mild tenderness in the right lower quadrant.  She was discharged on the  medications noted above with plans for followup in the office in 10 days.     ___________________________________________                                         Dirk Dress Katrinka Blazing, M.D.   LCS/MEDQ  D:  03/31/2004  T:  04/01/2004  Job:  [id number]

## 2011-05-03 NOTE — H&P (Signed)
NAME:  [name], [name]               ACCOUNT NO.:  [id number]   MEDICAL RECORD NO.:  [id number]          PATIENT TYPE:  INP   LOCATION:  A303                          FACILITY:  [facility]   PHYSICIAN:  Dirk Dress. Katrinka Blazing, M.D.   DATE OF BIRTH:  02-25-1961   DATE OF ADMISSION:  06/10/2005  DATE OF DISCHARGE:  LH                                HISTORY & PHYSICAL   A 50 year old female admitted for evaluation of an increasing peripheral  edema with some worsening dyspnea on exertion and shortness of breath.  The  patient has had worsening leg edema since early May.  She had been on Lasix  40 mg twice a day and originally improved.  When seen in early June, on May 27, 2005, she had 2+ edema of her left leg and 1+ edema of the right leg.  She did not have any shortness of breath at that time.  When she returned,  on June 10, 2005, which is the day of admission, she was complaining of  worsening swelling, inability to wear shoes though she only has a documented  2-pound weight gain.  She, however, was having increasing fatigue, dyspnea  on exertion, and shortness of breath.  She denied chest pain.  Because of  her symptom complex, the patient was admitted for evaluation and for  diuresis.   PAST HISTORY:  1.  She has hypertension.  2.  Severe migraine headaches.  3.  Depression.  4.  Chronic soft tissue pain.  5.  Diffuse abdominal pain.  6.  Chronic diarrhea.   Her headaches have been evaluated at [facility] which is  [facility].   MEDICATIONS:  1.  Lotrel 10/20 daily.  2.  Phenergan 25 mg two every four hours as needed.  3.  Demerol 50 mg four times daily.  4.  Imipramine 75 mg at bedtime.  5.  __________  nasal spray, one spray each nostril three times a day.  6.  Ketoprofen 75 mg every 12 hours.  7.  Zoloft 100 mg daily.  8.  Requip 1 mg 1-2 tabs one hour before bedtime.  9.  Lasix 40 mg twice daily.  10. Potassium chloride 20 mEq twice daily.    SURGERY:  1.  Cholecystectomy.  2.  Subtotal colectomy with ileostomy, ileostomy closure.  3.  Total abdominal hysterectomy.  4.  Umbilical hernia repair.   REVIEW OF SYSTEMS:  Positive for chronic migraine headaches, intermittent  heart flutters and palpitations, post prandial diarrhea, chronic low back  pain, progressive depression, chronic foot pain, progressive dyspareunia,  recurrent ringing in her ears, insomnia, diffuse body aches, intermittent  hemorrhoidal swelling.   PHYSICAL EXAMINATION:  VITAL SIGNS:  Blood pressure 104/68, pulse 92,  respirations 20, weight 179 pounds.  HEENT:  Unremarkable.  NECK:  Supple.  No JVD, bruit, adenopathy, or thyromegaly.  CHEST:  Clear to auscultation.  HEART:  Regular rate and rhythm without murmur, gallop or rub.  ABDOMEN:  Diffuse tenderness and nondistended.  More prominent tenderness  over old stoma site in the right lower quadrant  and previous stoma site in  the left lower quadrant.  She had a well healed midline scar.  She has  hyperactive bowel sounds.  EXTREMITIES:  With 4+ edema of feet and lower legs, 3+ edema of legs up to  her knees.  Diffuse tenderness of legs and feet.  No evidence of cellulitis.  No joint deformity.  NEUROLOGIC:  No focal motor or sensory or cerebellar deficit.   IMPRESSION:  1.  Progressive dyspnea with peripheral edema, must rule out heart failure.      We will also rule out thyroid difficulties or renal disease.  2.  Hypertension.  3.  Depression.  4.  Chronic soft tissue pain.  5.  Chronic migraine headaches.   PLAN:  1.  The patient will be continued on her baseline medications.  2.  She will be given IV Lasix and her potassium will be increased.  3.  She will get an echocardiogram and thyroid function studies.  4.  She may need to have a cardiac stress test.  5.  Her medications may need to be adjusted.       LCS/MEDQ  D:  06/11/2005  T:  06/12/2005  Job:  [id number]

## 2011-05-03 NOTE — Discharge Summary (Signed)
NAME:  [name], [name]                           ACCOUNT NO.:  [id number]   MEDICAL RECORD NO.:  [id number]                   PATIENT TYPE:  INP   LOCATION:  A201                                 FACILITY:  [facility]   PHYSICIAN:  Dirk Dress. Katrinka Blazing, M.D.                DATE OF BIRTH:  September 29, 1961   DATE OF ADMISSION:  08/30/2003  DATE OF DISCHARGE:  09/23/2003                                 DISCHARGE SUMMARY   DISCHARGE DIAGNOSES:  1. Chronic cholecystitis.  2. Diverticulitis, status post subtotal colectomy with ileostomy.  3. Chronic elevation of liver function transaminases.  4. Extensive intra-abdominal adhesions.   SPECIAL PROCEDURE:  1. Cholecystectomy with cholangiogram.  2. Ileostomy closure with ileoproctostomy.  3. Liver biopsy.  4. Left central venous catheter placement.   DISPOSITION:  The patient is discharged home much improved.   DISCHARGE MEDICATIONS:  1. Prevacid 30 mg daily.  2. Carafate suspension 1 gm q.i.d.  3. Reglan 2 mg a.c. and h.s.  4. Tylox 2 q.4h. as needed for pain.   FOLLOW UP:  The patient is scheduled to be seen in the office on October 18.   SUMMARY:  This is a 50 year old female admitted through the emergency room  for treatment of severe abdominal pain with nausea, vomiting, and diarrhea.  The patient states that she had abdominal pain on the day prior to  admission. She was having increased watery stools from her ileostomy.  She  described that her stool was essentially clear.  She had episodes of nausea  with vomiting.  She was in moderate distress, but afebrile and her labs were  normal in the emergency room.  Because of her severe discomfort she was  admitted.   Past history was significant in that she had diverticulitis status post  subtotal colectomy with abscess formation and retroperitoneal abscess  formation in early August.  She has had a temporary ileostomy since then.   PHYSICAL EXAMINATION:  On exam vital signs were stable.  She was  afebrile.  Abdominal exam revealed moderate epigastric and bilateral upper quadrant  tenderness.  She had a functioning stoma in the right lower quadrant.   It was felt initially that she may have acute gastroenteritis.  She was  treated however, and continued to have nausea and epigastric pain.  Her  diarrhea improved.  It was noted that her liver tests were normal.  HIDA  scan was done and this showed a gallbladder ejection fraction of 19%.  The  initial plan was to try to delay surgery, so we elected to try to the treat  the patient nonoperatively.  She had other pain during the initial few days  that we felt were musculoskeletal and it was decided that we would try to  treat her symptomatically before proceeding with surgery.   Her diarrhea totally resolved, but she started having episodes of nausea  with vomiting.  GI consult was obtained as a second opinion.  She was seen  by Dr. Karilyn Cota.  It was his impression that findings were consistent with  acute gastroenteritis and he was not convinced that her pain was secondary  to gallbladder disease.  We followed his recommendations. Her abdominal pain  did improve somewhat, but she then developed severe back pain.  She also had  a rise in her white count and a rise in her transaminases.  The back pain  was treated symptomatically.  White count gradually improved as did her  transaminases.  Over the next few days it appeared that her course was  improving.  Her symptoms, however, did not completely resolve. It was felt  that she had 2 major components of pain.  One in the upper abdomen and right  upper quadrant and the other in her lower back.  LFTs never returned to  normal though they did improve.   It was finally discussed with the patient, the need to have a  cholecystectomy and abdominal exploration.  She consisted and this was done  on September 28.  She was found to have extensive adhesions of her small  bowel loops without any  evidence of peritonitis.  She had a normal appearing  liver.  She had a thickened and chronically inflamed gallbladder without  evidence of stones.  Cholangiogram showed a normal common bile duct with  free flow into the duodenum.  There were no filling defects of her duct.  Liver biopsy was done to rule out upper sources of her elevated  transaminases.  In the postoperative period she showed gradual improvement.  She had a persistent tachycardia which was a sinus tachycardia.  The  etiology for this was never adequately understood.  There was no evidence of  any cardiac disease.  Pathology on her liver showed steatosis.  The patient  was slow to move in the postoperative period and developed some basilar  atelectasis.  She was pushed quite vigorously to ambulate.   By October 4 she was having bowel movements.  Nasogastric tube and Foley  catheter were discontinued at that time.  TPN was continued as her diet was  advanced.  She became afebrile.  She had multiple bowel movements.  She was  able to tolerate her diet without difficulty.  She was switched from IV or  oral medications.  By the 8th of October her appetite was good.  She had no  nausea or vomiting.  She was having 3 or 4 bowel movements per day and was  having copious amounts of flatus.  Pulse rate was still 106, but this was  fairly well controlled.  Her wounds were healing nicely.  The patient was  finally discharged home after a prolonged stay, much improved from the time  of admission.     ___________________________________________                                         Dirk Dress Katrinka Blazing, M.D.   LCS/MEDQ  D:  10/29/2003  T:  10/29/2003  Job:  [id number]

## 2011-05-03 NOTE — Discharge Summary (Signed)
[name], [name]               ACCOUNT NO.:  [id number]   MEDICAL RECORD NO.:  [id number]          PATIENT TYPE:  INP   LOCATION:  A320                          FACILITY:  [facility]   PHYSICIAN:  Tilda Burrow, M.D. DATE OF BIRTH:  1961-07-26   DATE OF ADMISSION:  05/02/2008  DATE OF DISCHARGE:  05/21/2009LH                               DISCHARGE SUMMARY   ADMITTING DIAGNOSES:  1. Chronic left lower quadrant pain associated with left ovarian cyst.  2. Status post partial colectomy for diverticulitis.  3. Status post vaginal hysterectomy.   DISCHARGE DIAGNOSES:  1. Chronic left lower quadrant pain.  2. Left ovarian cyst.  3. Right ovarian benign cystadenofibroma.  4. Endometriosis of the right tube and ovary.  5. Status post coectomy.  6. Status post vaginal hysterectomy years ago.   DISCHARGE MEDICATIONS:  New medicines are Percocet 5/500 one p.o. q.6 h.  p.r.n. pain.  Previous medications:  1. Morphine sulfate 30 mg p.o. b.i.d.  2. Nexium 40 mg twice daily for reflux disease, Dr. Malvin Johns.  3. Amlodipine/benazepril 5/2.5 one p.o. daily.  4. Potassium chloride 20 mEq p.o. daily.  5. Synthroid 0.112 mg daily.  6. Diclofenac 75 mg 1-2 tablets daily.  7. Seroquel 50 one p.o. nightly.  8. Cymbalta 60 mg p.o. daily.  9. Aldactone 50 mg p.o. twice daily.  10.Dicyclomine 20 mg p.o. q.6 h. p.r.n. bowel spasm.  11.Gabapentin 300 mg 3 times daily.  12.Metoclopramide 5 mg q.i.d.  13.Caltrate D twice daily.  14.Famciclovir 500 mg one p.o. daily.   HOSPITAL SUMMARY:  This 50 year old female who is status post  hysterectomy 5 years, status post acute diverticulitis resulting in  ileostomy and subsequent closure of the ileostomy, who was admitted for  chronic recurring debilitating pain in the right lower quadrant  associated with ovarian function.  She was referred by Dr. Lia Hopping  from [place] for evaluation and treatment.  She had pelvic exam showing a  small cyst and exquisite  pain in the area above the vagina where the  ovarian cyst exists.  She is admitted for laparotomy with Dr. Lovell Sheehan'  request and to assist due to the chronic nature of the pain and the  possibility of bowel adhesions.   HOSPITAL COURSE:  The patient underwent bowel prep 1 day prior to  surgery and underwent laparotomy, lysis of adhesions, and bilateral  salpingo-oophorectomy on May 02, 2008.  Surgery revealed an enlarged  tube and ovary which was benign, and on frozen section and on final  pathology, it was identified as a cystadenofibroma of the left ovary.  Similarly, enlarged right ovary with benign cystadenofibroma and  endometriosis was identified.  Postoperatively, the patient did well.  Surgery was notable for small bowel adhesions associated with the entry  to the abdominal cavity, which Dr. Laurence Compton.  There were  adhesions everywhere and the left ovary was enlarged and cystic, and  attached to the vaginal cuff resulting in traction discomfort on the  ovary from the infundibulopelvic ligament.  On postoperative day 2, the  patient was slowly recovering, felt to have satisfactory  diet but had  low enough pain threshold and was felt that she should stay an extra  day.  She was stable for discharge on 05/05/2008, though we have kept  her all day in order to ensure that she can tolerate regular diet with  the resumption of bowel function.  She was discharged home on May 05, 2008, for followup in 1 week for staple removal and in 4 weeks for  routine postsurgical evaluation.  She remained afebrile during the  hospitalization and was recovering appropriately with low pain tolerance  as expected.      Tilda Burrow, M.D.  Electronically Signed     JVF/MEDQ  D:  06/30/2008  T:  07/01/2008  Job:  [id number]   cc:   [facility]

## 2011-05-03 NOTE — Consult Note (Signed)
NAME:  [name], [name]               ACCOUNT NO.:  [id number]   MEDICAL RECORD NO.:  [id number]          PATIENT TYPE:  INP   LOCATION:  A331                          FACILITY:  [facility]   PHYSICIAN:  Kofi A. Gerilyn Pilgrim, M.D. DATE OF BIRTH:  1961-07-05   DATE OF CONSULTATION:  12/17/2005  DATE OF DISCHARGE:                                   CONSULTATION   The patient does not complain of any worsening weakness of the legs.  She  still continues to have spasms of the legs.  She complains of what appears  to be frequent myoclonic jerks involving upper and lower extremities which  may have been present before her hospitalization, but they are clearly  dramatically worse since she has been getting bronchodilators in the  hospital.   Her examination shows antigravity strength in the legs.  She has again  increased _tone in arms and increased tone in the legs.  She has 6 to 7  beats of clonus at the knees and brisk reflexes at the ankles.  Toes are  upgoing on the left and downgoing on the right.   MRI of the brain is normal with no white matter lesions seen.  MRI of the  cervical spine is also negative.   Blood testing: ESR 37. TSH 0.27.  RPR nonreactive.  Vitamin B12 0606,  homocysteine level 13.6.   ASSESSMENT AND PLAN:  Upper motor neuron process on physical examination  with negative imaging.  The patient's condition is worrisome for motoneuron  disease, and therefore, a well done dedicated electromography with needle  testing should be carried out.  We will try to arrange this to be done  inpatient while she is here.      Kofi A. Gerilyn Pilgrim, M.D.  Electronically Signed     KAD/MEDQ  D:  12/17/2005  T:  12/17/2005  Job:  [id number]

## 2011-05-03 NOTE — Consult Note (Signed)
NAMECYRIL, [name]                           ACCOUNT NO.:  [id number]   MEDICAL RECORD NO.:  [id number]                   PATIENT TYPE:  INP   LOCATION:  A337                                 FACILITY:  [facility]   PHYSICIAN:  Lionel December, M.D.                 DATE OF BIRTH:  1961/04/03   DATE OF CONSULTATION:  09/05/2003  DATE OF DISCHARGE:                                   CONSULTATION   REQUESTING PHYSICIAN:  Annia Friendly. Loleta Chance, M.D.   REASON FOR CONSULTATION:  Persistent abdominal pain.   HISTORY OF PRESENT ILLNESS:  The patient is a 50 year old black female who  underwent subtotal colectomy with temporary ileostomy on July 19, 2003 for  diverticulitis with retroperitoneal abscess formation.  She was admitted to  the hospital on August 30, 2003 with a 24-hour history of severe  abdominal pain associated with nausea, vomiting, and diarrhea.  She woke up  about 4 a.m. on Tuesday morning with these symptoms.  She noted increased  output in her ileostomy of clear liquid-like stool.   Since admission, diarrhea has resolved. She had fecal WBC and stool cultures  which were negative.  She continues to have some nausea and vomited as  recently as yesterday. She complains of persistent mid-abdominal pain which  wraps around both flank areas and goes into her back.  She denies any  dysphagia, odynophagia, fever or chills, melena, or blood in her ileostomy.  She has been able to consume a regular diet since admission.   Thus far she has had a HIDA scan which revealed gallbladder ejection  fraction of 18.8%.  Abdominal ultrasound revealed 1.5 cm right renal cyst,  but otherwise unremarkable.  CT of the abdomen and pelvis revealed a 1 cm  right renal cyst and probable left ovarian cyst as well as postoperative  changes with mesentery stranding in the upper abdomen.  Acute abdominal  series on admission revealed normal bowel gas pattern.   On admission her hemoglobin was 12.2, WBC 8.2.   BUN 11, creatinine 1, total  bilirubin 0.5, alkaline phosphatase 65, SGOT 65, SGPT 144, amylase 187.  Transaminases have gradually come down and today her SGOT is 38, SGPT 79.  Her lipase was 63 and amylase was 113.  Sedimentation rate was 10.   MEDICATIONS PRIOR TO ADMISSION:  1. Lexapro 10 mg daily.  2. Prevacid 30 mg daily.  3. Tylox 1 q.4h. p.r.n.   MEDICATIONS IN HOSPITAL:  1. Currently the patient is on Bentyl 20 mg q.i.d.  2. Diflucan 100 mg daily.  3. Medrol 250 mg q.6h. for 72 h.  4. IV Levaquin.  5. IV Protonix 40 mg q.12h.  6. IV Ancef.   ALLERGIES:  No known drug allergies.   PAST MEDICAL HISTORY:  1. History of diverticulitis, status post subtotal colectomy as outlined     above.  2. Anxiety disorder.  3. Gastroesophageal  reflux disease.  4. History of hypertension, although she is no longer requiring any     medication per her report.  5. In 1998 she saw Dr. Jena Gauss who performed EGD which revealed indistinct     Schatzki ring, status post dilatation.  Colonoscopy revealed left-sided     diverticula and internal hemorrhoids.   PAST SURGICAL HISTORY:  1. Partial hysterectomy.  2. Umbilical hernia repair.  3. Right ethmoid mucocele/right middle inferior turbinate hypertrophy     surgery by Dr. Pollyann Kennedy.  4. Subtotal colectomy with a temporary ileostomy as outlined above.   FAMILY HISTORY:  Maternal grandfather had prostate cancer.  No family  history of colorectal cancer.   SOCIAL HISTORY:  She was employed at [employer], but she tells me that she  has been laid off due to her illness.  She smokes 4-5 cigarettes daily.  She  occasional consumes alcohol. She has 2 children.  She lives with her  boyfriend.   REVIEW OF SYSTEMS:  Please see HPI for GI and for general.  CARDIOPULMONARY:  Denies chest pain or shortness of breath.   PHYSICAL EXAMINATION:  VITAL SIGNS: Height 63 inches.  Weight 165.7.  Temperature 97.7, pulse 78, respirations 18, blood pressure  130/85.  GENERAL:  A pleasant, well-nourished, well-developed, black female in no  acute distress.  SKIN:  Warm and dry.  No jaundice.  HEENT:  Pupils are equal, round, and reacted to light.  Conjunctivae are  pink.  Sclerae anicteric.  Oropharyngeal mucosa moist and pink.  No  lymphadenopathy or thyromegaly.  CHEST:  Lungs are clear to auscultation.  CARDIOVASCULAR:  Cardiac exam reveals regular rate and rhythm.  Normal S1,  S2.  No murmurs, rubs, or gallops.  ABDOMEN:  Positive bowel sounds, soft, nondistended.  She has diffuse  moderate tenderness to even light palpation throughout her abdomen. She has  tenderness with palpation of the lower rib cage margin bilaterally.  Ileostomy present in the right lower quadrant.  No rebound tenderness or  guarding.  The abdomen is very soft.  EXTREMITIES:  No edema.   IMPRESSION:  The patient is a pleasant 50 year old black female with history  of subtotal colectomy with an ileostomy performed on July 19, 2003 for  diverticulitis with retroperitoneal abscess formation.  She presented 6 days  ago with diffuse abdominal pain, nausea, vomiting, and diarrhea.  Initial  presentation was most consistent with acute gastroenteritis.  It is possible  that she has postinfectious abdominal pain at this point.  She was noted to  have a bump in her transaminases initially and they have consistently  continued to decline towards normal.  She had a mildly elevated lipase which  was nonspecific.  She has an abnormal HIDA scan with gallbladder ejection  fraction of 19%.  Her abdominal pain is not consistent with a biliary  etiology or a gallbladder disease.  The bump her in transaminases may have  been secondary to acute illness alone.   At this point the etiology of her diffuse abdominal pain is unclear.  She  remains moderately tender even to light palpation.  Symptoms are not consistent with small-bowel obstruction given that she had an increased   ileostomy output initially.  Cannot rule out intraperitoneal infection or  sepsis, although would expect her to be more toxic appearing, have ascites,  and fever.   SUGGESTIONS:  1. Antibiotic coverage reasonable.  2. Will monitor the patient overnight.  If no significant improvement may  consider a small bowel follow through as the next step.  She may     ultimately need to have an exploratory laparotomy with possible     cholecystectomy and liver biopsy.  3. Recheck LFTs, lipase and CBC in the morning.  4. Dr. Karilyn Cota to attend physical therapy  5. Further recommendations to follow.   I would like to thank Dr. Loleta Chance for allowing [place] to take part in the care of  this patient.     ________________________________________  ___________________________________________  Tana Coast, P.A.                         Lionel December, M.D.   LL/MEDQ  D:  09/05/2003  T:  09/05/2003  Job:  [id number]   cc:   Annia Friendly. Loleta Chance, M.D.  [address]    [place] [address]  Fax: [phone]

## 2011-05-03 NOTE — Discharge Summary (Signed)
NAMEELNOR, RENOVATO               ACCOUNT NO.:  [id number]   MEDICAL RECORD NO.:  [id number]          PATIENT TYPE:  INP   LOCATION:  A331                          FACILITY:  [facility]   PHYSICIAN:  Dirk Dress. Katrinka Blazing, M.D.   DATE OF BIRTH:  [birth year]   DATE OF ADMISSION:  12/13/2005  DATE OF DISCHARGE:  01/06/2007LH                                 DISCHARGE SUMMARY   DISCHARGE DIAGNOSIS:  1.  Recurrent nausea/vomiting, probably related to irritable bowel syndrome.  2.  Increase in dyspnea, etiology undetermined.  3.  Recurrent syncope with myoclonic jerks, etiology undetermined.  4.  Depression.  5.  Hypokalemia.  6.  Gastroesophageal reflux disease.  7.  New onset diabetes mellitus.  8.  Hypertension.  9.  Migraine headaches.   SPECIAL CONSULTATION:  Neurology.   DISPOSITION:  The patient discharged home improved, etiology for her  symptoms is still not well delineated.   DISCHARGE MEDICATIONS:  1.  Requip 2 mg at bedtime.  2.  Methadone 20 mg three times daily.  3.  Oxycodone 20 mg three times daily.  4.  Bentyl 20 mg four times daily.  5.  Xanax 1 mg three times daily.  6.  Synthroid 100 mcg daily.  7.  Demadex 20 mg twice daily.  8.  Cymbalta 60 mg daily.  9.  Prevacid 30 mg daily.  10. Potassium chloride 20 mEq three to four times daily.  11. Phenergan 25 mg four times daily.   FOLLOW UP:  The patient will be seen in the office 2 weeks post discharge.   SUMMARY:  This is a 50 year old female with complaint of recurrent nausea,  vomiting, migraine headaches. She states that she had difficulty swallowing  food. She was having increasing abdominal pain. She also was unable to keep  down any medicine. She gave a history of her right leg giving away with  multiple falls. She had no warning of her falls. There is no history of  injury and no history of loss of consciousness. She also complained of  increasing dyspnea on exertion and worsening shortness of breath with  inability to walk greater than one fourth block. She also had increasing  foot and leg pain. With this constellation of symptoms it was felt the  patient needed to be admitted to try to get control of her pain, control her  nausea and vomiting so that she could take her medication orally and to get  a neurology evaluation.   Past history is given in the admission note.   On examination blood pressure was 93/50, pulse 79, respirations 16. Lung  exam was unremarkable. Abdomen revealed moderate tenderness in the  epigastrium, right lower quadrant, left lower quadrant. She had hyperactive  bowel sounds. She had 2+ peripheral edema with tenderness of the calf. There  was no focal motor, sensory or cerebellar deficit but because she was  slightly hyperreflexic.   On labs her pO2 was 57 on room air, pH 7.41, prior to discharge pO2 was 68  on room air, white count was normal, hemoglobin stayed about 7, her  potassium was 3 but it was 3.9, prior to discharge BUN and creatinine were  normal. Liver tests were normal. Homocysteine was 13.6. TSH was low at  0.027. B12 was 606. RPR was nonreactive. Acute abdominal series was  unremarkable. MRI of the brain showed no acute intracranial abnormality. MRI  of the cervical spine showed small disk protrusion at C4-5, C5-6, C6-7  without foraminal stenosis.   The patient's hospital course was that of gradual improvement. She was  basically treated symptomatically and she continued to have shortness of  breath throughout her hospitalization but by the time of discharge she was  ambulating around the hall without difficulty. She was seen by neurology but  no etiology for her syncopal episode was found. It was decided that she  would need to have an EEG and possibly some peripheral nerve conduction  studies. Dr. Gerilyn Pilgrim also stated that he wanted to do an EMG but this was  scheduled for outpatient. By the 5th she was improved and her abdominal pain  with  decreasing. She no longer had nausea/vomiting and it was felt that she  was adequately recovered, that we could evaluate her as an outpatient and  continue to do her workup as an outpatient. She was discharged in  satisfactory condition.      Dirk Dress. Katrinka Blazing, M.D.  Electronically Signed     LCS/MEDQ  D:  02/04/2006  T:  02/04/2006  Job:  [id number]

## 2011-05-03 NOTE — H&P (Signed)
[name], [name]                           ACCOUNT NO.:  [id number]   MEDICAL RECORD NO.:  [id number]                   PATIENT TYPE:  INP   LOCATION:  A322                                 FACILITY:  [facility]   PHYSICIAN:  Dirk Dress. Katrinka Blazing, M.D.                DATE OF BIRTH:  [birth year]   DATE OF ADMISSION:  02/29/2004  DATE OF DISCHARGE:                                HISTORY & PHYSICAL   HISTORY OF PRESENT ILLNESS:  A 50 year old female with known diverticular  disease with history of increasing recurrent persistent abdominal pain.  Pain is worse in the right lower quadrant.  She had nausea with vomiting,  fever, chills, crampy abdominal.  She was having worsening postprandial  diarrhea.  The patient has been followed in the office for over a month and  has had continued symptoms.  She seems to be in severe distress due to her  nausea, abdominal pain, and she looks chronically ill.  She is admitted for  further evaluation and symptomatic treatment.   PAST HISTORY:  She has diverticulosis and had a severe bout of  diverticulitis in July 2004.  She was hospitalized initially for almost a  month and underwent subtotal colectomy with ileostomy and Hartmann's pouch.  She subsequently had ileostomy closure with an ileosigmoid colostomy placed.  She had cholecystectomy done at that time.  The patient has had a prolonged  postoperative course and has never adequately recovered.  She has had  episodes of shortness of breath, dyspnea on exertion, increased diffuse  abdominal pain, severe episodes of depression.  Other medical problems  include hypertension, anxiety disorder, chronic anemia, gastroesophageal  reflux disease, and herpes genitalis.   SURGERIES:  1. Cholecystectomy.  2. Subtotal colectomy with ileostomy and Hartmann's pouch followed by     ileostomy closure with ileoproctostomy,  3. Total abdominal hysterectomy  4. Umbilical hernia repair.   MEDICATIONS:  1. __________ 500  mg b.i.d.  2. Ambien 5 mg q.h.s.  3. Hydrocodone 5/500 two q.4h. p.r.n. pain.  4. InnoPran XL 80 mg daily for headache.  5. Levsin 0.125 mg q.6h.  6. Prevacid 30 mg daily.  7. Xanax 1 mg t.i.d.  8. Lexapro 10 mg daily.   REVIEW OF SYSTEMS:  Positive for severe headache with nausea, increasing  dyspnea on exertion and shortness of breath, diffuse joint pains, chronic  aching in legs and thighs with increasing peripheral edema, increasing  depression with anxiety.   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  She smokes one pack of cigarettes per day.  She drinks occasional  alcohol.   PHYSICAL EXAMINATION:  GENERAL:  She is an acutely-ill-appearing female who  looks her stated age.  VITAL SIGNS:  Blood pressure 120/60, pulse 68, respirations 16, weight 172  pounds.  HEENT:  Unremarkable except she has tenderness of her scalp in the temporal  areas bilaterally and she has tenderness of the occipital area.  Pupils are  equal and reactive.  Extraocular movements are full.  Sclerae and  conjunctivae are clear.  Oropharynx normal.  Gag reflex and tongue are  intact.  NECK:  Supple with some posterior tenderness with spasm of the posterior  cervical muscles.  No adenopathy.  CHEST:  Clear to auscultation.  No rales, rubs, rhonchi, or wheezes.  HEART:  Regular rate and rhythm without murmur, gallop, or rub.  ABDOMEN:  Nondistended, prominent tenderness of the lower abdomen,  especially over the ileostomy scar in the right lower quadrant.  No palpable  masses are noted.  She has hyperactive bowel sounds.  EXTREMITIES:  There is tenderness of the bilateral calves and upper thighs.  There is tenderness along the lesser saphenous system bilaterally from the  groin down to the ankle but I cannot palpated venous cords.  She has no  pedal edema at this time.  Joints are tender  but they have no deformity and  there is no crepitus and no decreased range of motion.  NEUROLOGIC:  No focal motor, sensory, or cerebellar deficit.  The patient  does appear to be clinically depressed.   IMPRESSION:  1. Acute abdominal pain, etiology undetermined, with prior history of     diverticulitis, must rule out recurrent episode.  2. Recurrent severe headache of uncertain etiology, possibly migraine     headache or stress-related headache.  3. Insomnia.  4. Hypertension.  5. Chronic anemia.  6. Gastroesophageal reflux disease.  7. Herpes genitalia.   PLAN:  The patient will be admitted.  Will get a tagged white cell scan to  rule out inflammatory process in the abdomen.  She had a CT less than a  month ago which was normal.  She will be started on Zelnorm and MiraLax for  her bowels.  CT of the head will be done to rule out some occult  intracranial abnormality.  She will be treated with IV analgesics and  antiemetics until her symptoms are treated or resolved.     ___________________________________________                                         Dirk Dress Katrinka Blazing, M.D.   LCS/MEDQ  D:  03/01/2004  T:  03/01/2004  Job:  [id number]

## 2011-05-03 NOTE — H&P (Signed)
NAME:  [name], [name]                           ACCOUNT NO.:  [id number]   MEDICAL RECORD NO.:  [id number]                   PATIENT TYPE:  AMB   LOCATION:  DAY                                  FACILITY:  [facility]   PHYSICIAN:  Jerolyn Shin C. Katrinka Blazing, M.D.                DATE OF BIRTH:  1961/10/08   DATE OF ADMISSION:  DATE OF DISCHARGE:                                HISTORY & PHYSICAL   A 50 year old female with a history of recurrent severe heartburn unaffected  by H2 blockers and proton pump inhibitor therapy.  The patient is scheduled  for a diagnostic endoscopy for evaluation.   PAST HISTORY:  1. Acute diverticulitis, status post total colectomy with partial ileectomy     with ileal protostome with a resultant short-gut syndrome.  2. Osteoarthritis.  3. Situational depression.  4. Chronic abdominal pain due to irritable bowel syndrome.   MEDICATIONS:  1. Levsin 0.125 mg a.c. and h.s.  2. Alprazolam 1 mg t.i.d.  3. Lexapro 10 mg every day.  4. Phenergan 25 mg q.4h. p.r.n.  5. Hydromorphone 4 mg one tab q.4h. p.r.n.  6. Carafate 1 gram q.i.d.  7. Prevacid 30 mg every day.  8. Xanax 1 mg t.i.d.  9. Lunesta 3 mg q.h.s.  10.      Lexapro 10 mg every day.   REVIEW OF SYSTEMS:  Positive for recurrent sore throat, chronic insomnia,  severe headaches, severe heartburn, peripheral edema with swelling of feet  and legs, chronic low back pain, post prandial dumping with diarrhea.   SURGERY:  1. Left colectomy with ileostomy.  2. Total colectomy with partial ileal resection and ileal protostome.  3. Cholecystectomy.  4. Hysterectomy, given previously.   PHYSICAL EXAMINATION:  VITAL SIGNS:  Blood pressure 130/90, pulse 76,  respirations 20, weight 163 pounds.  HEENT:  Unremarkable except for absent upper teeth.  NECK:  Supple.  No JVD or bruit.  CHEST:  Clear to auscultation.  HEART:  Regular rate and rhythm without murmur, gallop or rub.  ABDOMEN:  Diffuse abdominal tenderness with  severe tenderness over old  ileostomy site right lower quadrant, moderate hypogastric tenderness,  hyperactive bowel sounds.  EXTREMITIES:  Unremarkable except for peripheral edema.  NEUROLOGIC:  No focal motor, sensory, or cerebellar deficit.   IMPRESSION:  1. Gastroesophageal reflux disease with intractable heartburn.  2. Chronic abdominal pain due to prior history of peritonitis with extensive     adhesions.  3. Short-gut syndrome status post total colectomy and partial ileectomy.  4. Osteoarthritis.  5. Depression.   PLAN:  Upper endoscopy.     ___________________________________________                                         Dirk Dress. Katrinka Blazing, M.D.   LCS/MEDQ  D:  07/25/2004  T:  07/26/2004  Job:  [id number]

## 2011-05-03 NOTE — Op Note (Signed)
NAME:  [name], [name]               ACCOUNT NO.:  [id number]   MEDICAL RECORD NO.:  [id number]          PATIENT TYPE:  INP   LOCATION:  A304                          FACILITY:  [facility]   PHYSICIAN:  Dirk Dress. Katrinka Blazing, M.D.   DATE OF BIRTH:  1961-10-28   DATE OF PROCEDURE:  12/04/2004  DATE OF DISCHARGE:                                 OPERATIVE REPORT   PREOPERATIVE DIAGNOSES:  1.  Rectal bleeding.  2.  Recurrent abdominal pain.   POSTOPERATIVE DIAGNOSES:  1.  Acute rectal ulcers with bleeding.  2.  Internal hemorrhoid disease.  3.  Recurrent abdominal pain.   PROCEDURE:  Colonoscopy.   SURGEON:  Dirk Dress. Katrinka Blazing, M.D.   DESCRIPTION:  The patient was taken to the endoscopy suite.  She was  premedicated with Versed 4 mg and Demerol 75 mg IV.  Total complete  monitoring by blood pressure, SAO2, and pulse was carried out.  After  adequate level of sedation was obtained, digital examination was done and  this revealed enlarged internal hemorrhoid without any mass effect.  There  was blood on the examining finger.  The colonoscope was introduced.  The  colonoscope was maneuvered to about midsigmoid level where the ileocolic  anastomosis was found.  The ileocolic anastomosis appeared to be intact.  The small bowel and the colonic side of the anastomosis appeared  unremarkable.  There was no evidence of anastomotic bleeding.  Slow  withdrawal of the scope revealed no other abnormality.  There were no  openings of diverticulosis.  In the rectum the scope was retroverted and  small internal hemorrhoids were noted.  As the scope was slowly withdrawn,  there were three linear ulcers at the dentate line and extending slightly  above the dentate line.  These were mostly posterior.  Two of the ulcers had  a fibrinous base, suggesting healing.  There was no ulcer found bleeding,  but there was some friability around the ulcers.  The patient tolerated the  procedure well.   RECOMMENDATIONS:  She  will receive Anusol-HC suppositories every six hours.  She will also be started on Bentyl for her abdominal pain, and she will be  continued on her baseline medications.     Lero   LCS/MEDQ  D:  12/04/2004  T:  12/05/2004  Job:  [id number]

## 2011-05-03 NOTE — Consult Note (Signed)
NAME:  [name], [name]               ACCOUNT NO.:  [id number]   MEDICAL RECORD NO.:  [id number]          PATIENT TYPE:  INP   LOCATION:  A331                          FACILITY:  [facility]   PHYSICIAN:  Kofi A. Gerilyn Pilgrim, M.D. DATE OF BIRTH:  10-30-1961   DATE OF CONSULTATION:  12/15/2005  DATE OF DISCHARGE:                                   CONSULTATION   IMPRESSION:  1.  Right leg weakness with the neurological examination worrisome of the      uppermost neural process particularly from a myelopathy, possibly      cervical. This could be a compressive process but also other      noncompressive processes could be playing, including nutritional      deficiency, and demyelinating processes. Other differential to consider      includes amyotrophic lateral sclerosis.  2.  Chronic daily headaches, migraine type. Chronic pain syndrome. Baseline      history of irritable bowel disease. She may be a person who has      combination processes of irritable bowel syndrome/fibromyalgia/chronic      daily headaches.  3 Diabetes.  1.  Hypertension.  2.  Abdominal pain.   RECOMMENDATIONS:  1.  MRI of the cervical spine and brain.  2.  She should have a well done nerve conduction study with needle      tomography. This is even more important if the imaging of the brain and      cervical spine is unrevealing. She needs to have one done by a      neurologist and not one done at the hospital.  3.  Blood testing including RPR, vitamin B12 level, homocysteine level,      thyroid function test.  4.  Further recommendations will be forthcoming dependent on the above      results.   HISTORY:  This is a 50 year old black female who has had frequent headaches  for the past few years. She indicates that since she had her abdominal  surgery, which apparently was done because of acute abdomen problems, she  has had headaches since then. Headaches occur quite frequently. Headaches  occur on a daily basis,  essentially as a low-grade bifrontal headache.  However about twice per week she has severe headaches with photophobia and  phonophobia. The patient also indicates that over the past month or so she  has had right leg weakness with what appears to be spasm and stiffening of  the leg. She denies any sensory symptoms such as numbness or tingling. She  does have some pain she which she attributes to the spasms. She does report  some back pain. The patient has a lot of pain problems including abdominal  pain, and a chronic pain syndrome and is on opiate therapy for this.   PAST MEDICAL HISTORY:  1.  She has had recurrent bouts of severe abdominal pain which developed      after an acute diverticulitis requiring acute surgery with subtotal      colostomy and ileostomy. She later had closure.  2.  History of depression.  3.  Migraines.  4.  Recurrent diarrhea.  5.  Gastroesophageal reflux disease.  6.  Hypertension.  7.  Hypothyroidism.   ADMISSION MEDICATIONS:  1.  Cymbalta 60 mg.  2.  Reglan 5 mg.  3.  Oxycodone 40 mg t.i.d.  4.  Torsemide 20 mg b.i.d.  5.  Levoxyl 125 mcg daily.  6.  Phenergan 25 mg q.4h.  7.  Alprazolam 1 mg daily.  8.  Bentyl 20 mg q.6h.  9.  Nexium 40 mg daily.  10. Amitriptyline 25 mg q.h.s.  11. Zoloft 75 mg daily.  12. Potassium.   PAST SURGICAL HISTORY:  Cholecystectomy, status post colectomy with  ileostomy and closure. Hernia repair and total abdominal hysterectomy.   REVIEW OF SYSTEMS:  Positive for diarrhea, depression, chronic multiple  location soft tissue pain, and dyspnea on exertion.   PHYSICAL EXAMINATION:  GENERAL:  This is a very pleasant morbidly overweight  lady in no acute distress.  VITAL SIGNS:  Temperature 98.2, respirations 16, blood pressure 93/51.  HEENT:  Normocephalic, atraumatic.  NECK:  Supple.  MENTATION:  The patient is awake and alert, she converses well. Speech is  normal, language and comprehension are also normal.  Cranial nerve evaluation  - pupils are equal, round, reactive to light and accommodation. Visual  fields are full. Extraocular movements are intact. Facial muscles are  symmetric. Tongue is midline, uvula is midline. Motor examination shows  increased tone in the legs. Direct shrug testing is normal, however, she has  good strength testing in the upper extremities with normal bulk in upper  extremities. The bulk of the legs are also normal. Reflexes are  pathologically brisk particularly in the legs with cross adductus noted on  both sides.  Toes are essentially down going. Sensory examination - normal to light touch  and temperature. Coordination intact. Gait is wide based and slightly  spastic. CT scan of the brain is normal, no acute process.   LABORATORY DATA:  ESR is 37. PSA 0.027 which is low. Sodium 136, potassium  3, chloride 98, CO2 27, glucose 143, BUN 7, creatinine 3, calcium 8.9. Liver  enzymes normal. WBC 7000, hemoglobin 11.9, platelet count 237,000.   Thanks for this consultation.      Kofi A. Gerilyn Pilgrim, M.D.  Electronically Signed     KAD/MEDQ  D:  12/15/2005  T:  12/15/2005  Job:  [id number]

## 2011-05-03 NOTE — Op Note (Signed)
NAME:  [name], [name]                           ACCOUNT NO.:  [id number]   MEDICAL RECORD NO.:  [id number]                   PATIENT TYPE:  INP   LOCATION:  A201                                 FACILITY:  [facility]   PHYSICIAN:  Dirk Dress. Katrinka Blazing, M.D.                DATE OF BIRTH:  Apr 28, 1961   DATE OF PROCEDURE:  DATE OF DISCHARGE:                                 OPERATIVE REPORT   PREOPERATIVE DIAGNOSES:  1. Chronic cholecystitis and diverticulitis status post subtotal colectomy     with ileostomy.  2. Chronic elevation of the liver function studies.   POSTOPERATIVE DIAGNOSES:  1. Chronic cholecystitis and diverticulitis status post subtotal colectomy     with ileostomy.  2. Chronic elevation of the liver function studies.   PROCEDURES:  Cholecystectomy with cholangiogram, ileostomy closure with  ileoproctostomy, needle liver biopsy.   SURGEON:  Dirk Dress. Katrinka Blazing, M.D.   DESCRIPTION OF PROCEDURE:  Under general anesthesia the patient's abdomen  was prepped and draped in a sterile field.  The stoma in the right lower  quadrant was sutured closed with running locking 2-0 silk.  After this was  done repeat prep was carried out and draping was completed using iodine  impregnated _______ hydrate.  A midline incision was made.  There were  extensive adhesions of the small-bowel loops to the anterior abdominal wall  because of the lack of omentum that was removed during the last operation.   Using gentle care the peritoneum was entered and the small-bowel loops were  dissected away from the anterior abdominal wall without difficulty.  This  was a rather extensive procedure.  Once the peritoneal cavity was entered  dissected was carried out in the right upper quadrant.  The gallbladder was  visualized.  Using electrocautery and blunt dissection the gallbladder was  separated from the infrahepatic space without difficulty.  Some small  vessels were triply clipped with hemoclips and divided.   Dissection was  continued down to the cystic artery and the cystic duct.  The cystic artery  had 2 branches.  Each was clipped with 4 clips and divided.   A small incision was made in the proximal cystic duct close to the  gallbladder.  A catheter was placed in the cystic duct and flushed with  saline. It did show free flow into the distal duct without any evidence of  bleed.  A contrast dye mixed with 2 parts dye to 1 part saline was used.  A  cholangiogram was done.  This showed normal caliber common bile duct with  free flow into the duodenum with an easy tapering of the distal duct at the  papilla.  The common bile duct and common hepatic duct appeared to be  normal.  The intrahepatic duct was not adequately visualized.  There were no  filling defects.   The cystic duct was dissected, doubly clamped and tied,  and divided. It was  then tied with a ligature of 2-0 silk and clipped with 2 large hemoclips.  Once this was done hemostasis in the bed was achieved.  A TruCut needle was  used in the right lobe of the liver to remove 2 segments of liver. This was  sent for pathologic evaluation.  The tracks were cauterized to prevent  bleeding.   Next, further dissection of the small-bowel was carried out with free  exposure of the small-bowel from the ligament of Treitz down to the  ileostomy.  The residual rectum was found in the deep pelvis.  It was  dissected free.  The terminal ileum which entered the anterior abdominal  wall in the right upper quadrant was dissected circumferentially.  Once this  was done, it was divided using the TIA-60 stapler.  The side of the terminal  ileum was then sutured to the side of the rectal stump with the GIA-60  stapler.  The opening between the 2 was then closed with the TA-55 stapler.  The staple lines were all reinforced with running 3-0 Prolene.  The  mesenteric defect was closed with interrupted 3-0 silk.  The small bowel was  inspected to make sure  that there was no evidence of acute angulation.  There was no evidence of wall injury.  The nasogastric tube was positioned  in the stomach.   Once this was done, the stump of the old ileostomy was resected using  electrocautery.  Hemostasis in the old stoma site was achieved.  The area  was irrigated. It was then closed in layers using #0 Biosyn, 2-0 Biosyn and  3-0 Biosyn.  Further inspection of the small bowel was carried out with no  defect, kinking, or evidence of malrotation being appreciated.  Anastomosis  was inspected.  It was found to be widely patent.  The mesenteric defect was  inspected and there was no defect that was large enough for herniation of  the small bowel.   The abdomen was closed using running #1 Prolene.  Subcutaneous tissue was  closed with 2-0 Biosyn.  The skin was closed with staples. The skin over the  stoma site was closed with interrupted 3-0 Prolene.  Dressing of 4 x 4's and  tape were placed over each operative site.  The patient tolerated the  procedure well.  She was awakened from anesthesia, transferred to a bed and  taken to the ICU for postanesthetic monitoring.      ___________________________________________                                            Dirk Dress. Katrinka Blazing, M.D.   LCS/MEDQ  D:  09/13/2003  T:  09/13/2003  Job:  [id number]

## 2011-05-03 NOTE — H&P (Signed)
NAME:  [name], [name]               ACCOUNT NO.:  [id number]   MEDICAL RECORD NO.:  [id number]          PATIENT TYPE:  OBV   LOCATION:  A306                          FACILITY:  [facility]   PHYSICIAN:  Dirk Dress. Katrinka Blazing, M.D.   DATE OF BIRTH:  [birth year]   DATE OF ADMISSION:  04/19/2006  DATE OF DISCHARGE:  LH                                HISTORY & PHYSICAL   HISTORY OF PRESENT ILLNESS:  This is one of multiple admissions for this 50-  year-old female with chronic abdominal pain, which has been persistent since  she had acute diverticulitis with retroperitoneal abscess and underwent  subtotal colectomy in 2004. The patient has had a recent admission for  recurrent nausea and vomiting. She states that she has not had a bowel  movement since she was discharged. She relates that her last bowel movement  was about 8 or 9 days ago. She has tried multiple over-the-counter remedies  without improvement. She is admitted because of uncontrolled pain,  increasing nausea, abdominal distention and inability to have a bowel  movement despite treatment in the emergency room and multiple home remedies.   PAST MEDICAL HISTORY:  1.  Irritable bowel syndrome.  2.  Severe depression.  3.  Gastroesophageal reflux disease.  4.  Diabetes mellitus.  5.  Hypertension.  6.  Migraine headaches.  7.  Chronic pain syndrome.   MEDICATIONS:  1.  Requip 2 mg q.h.s.  2.  Methadone 20 mg t.i.d.  3.  Oxycodone 20 mg t.i.d.  4.  Bentyl 20 mg q.i.d.  5.  Xanax 1 mg t.i.d.  6.  Synthroid 100 mg daily.  7.  Cymbalta 60 mg daily.  8.  Demodex 20 mg twice daily.  9.  Prevacid 30 mg daily.  10. Potassium 20 meq three times daily.  11. Phenergan 25 mg four times daily.   PAST SURGICAL HISTORY:  1.  Subtotal colectomy with ileostomy.  2.  Ileostomy closure.  3.  Hysterectomy.   REVIEW OF SYSTEMS:  Positive for chronic debility; chronic headaches;  recurrent episodes of nausea and vomiting; diffuse soft tissue pain of  her  upper extremities, trunk, lower extremities; chronic leg edema; chronic leg  pain; chronic heartburn due to GE reflux; recurrent severe migraine  headaches; increasing depression; and chronic anxiety with panic disorder.   PHYSICAL EXAMINATION:  GENERAL:  The patient looks severely depressed. She  appears to be in acute distress.  VITAL SIGNS:  Blood pressure 97/64, pulse 100, respiratory rate 20,  temperature 97.9. Weight 218 pounds.  HEENT:  Unremarkable.  SKIN:  There is no jaundice.  NECK:  Supple. No JVD, bruit, adenopathy, or thyromegaly.  CHEST:  Clear to auscultation, though she has decreased breath sounds at the  bases.  HEART:  Regular rate and rhythm without murmur, rub, or gallop.  ABDOMEN:  Obese, distended, moderate diffuse tenderness throughout. More  prominent in mid abdomen and both lower quadrants. She has active bowel  sounds.  EXTREMITIES:  2+ edema of feet and legs. No ulcerations.  NEUROLOGIC:  No focal, motor, sensory, or cerebellar deficits.  IMPRESSION:  1.  Severe constipation.  2.  Chronic irritable bowel syndrome.  3.  Gastroesophageal reflux disease.  4.  Depression.  5.  Diabetes mellitus.  6.  Hypertension.  7.  Migraine headaches.  8.  Chronic pain syndrome with narcotic dependency.   PLAN:  The patient is admitted. She is started on IV fluids. She will be  given milk and molasses enema x2. She will receive GoLYTELY 4,000 cc orally.  Will get followup abdominal series. Hopefully, this will be effective and  she will be discharged home on her baseline medications with Chronulac syrup  and MiraLax for her severe constipation.      Dirk Dress. Katrinka Blazing, M.D.  Electronically Signed     LCS/MEDQ  D:  04/20/2006  T:  04/20/2006  Job:  [id number]

## 2011-05-03 NOTE — H&P (Signed)
NAME:  [name], [name]               ACCOUNT NO.:  [id number]   MEDICAL RECORD NO.:  [id number]          PATIENT TYPE:  INP   LOCATION:  A304                          FACILITY:  [facility]   PHYSICIAN:  Dirk Dress. Katrinka Blazing, M.D.   DATE OF BIRTH:  09-25-1961   DATE OF ADMISSION:  12/03/2004  DATE OF DISCHARGE:  LH                                HISTORY & PHYSICAL   HISTORY:  A 50 year old female with a history of new onset of rectal  bleeding.  The bleeding started three days prior to admission.  She was seen  in the emergency room two days prior to admission.  No abnormalities were  found.  She did not have fever or chills.  She did complain of pain in the  right lower quadrant.  She has some nausea which has been a chronic  complaint.  The patient was seen in the office and was in severe distress.  She had bright red bleeding per rectum.  She is admitted for evaluation and  for urgent colonoscopy.   PAST MEDICAL HISTORY:  1.  History of diverticulitis with a major retroperitoneal abscess, status      post resection.  2.  History of chronic gastritis.  3.  Gastroesophageal reflux disease with esophagitis.  4.  Severe migraine headaches.  5.  Hypertension.  6.  Chronic anemia.  7.  Chronic abdominal adhesive disease.  8.  Depression with anxiety.   MEDICATIONS:  1.  Zoloft 50 mg daily.  2.  Lotrel 5/10 mg daily.  3.  Pamelor SS one q.i.d.  4.  Lexapro 10 mg daily.  5.  Xanax 1 mg t.i.d.  6.  Propranolol 40 mg b.i.d.  7.  Prevacid 30 mg daily.   PAST SURGICAL HISTORY:  1.  Cholecystectomy.  2.  Subtotal colectomy with ileostomy.  3.  Ileostomy closure.  4.  Total abdominal hysterectomy.  5.  Umbilical hernia repair.   REVIEW OF SYSTEMS:  Chronic migraine headaches.  She has intermittent heart  flutters and palpitations.  There is a history of recurrent post-prandial  diarrhea which has been present since her ileostomy closure.  She has  chronic diffuse low back pain.  She has  increasing depression.  She has  chronic foot pain, and has had difficulty with dyspareunia and an  anorgasmic_________state.   PHYSICAL EXAMINATION:  GENERAL:  She appears to be in moderate distress.  VITAL SIGNS:  Blood pressure 140/100, pulse 80, respirations 20, weight 153  pounds.  HEENT:  Unremarkable.  There is no evidence of jaundice.  NECK:  Supple, no JVD, bruit, adenopathy, or thyromegaly.  No tenderness.  LUNGS:  Clear to auscultation.  Chest wall nontender.  No masses.  HEART:  Regular rate and rhythm.  No murmurs, rubs, or gallops.  ABDOMEN:  Nondistended, diffuse tenderness much more prominent in the right  lower quadrant and left lower quadrant.  There is tenderness over the  previous ileostomy site in the right lower quadrant.  She has a well-healed  midline incision.  RECTAL:  Large tender hemorrhoid.  Stool is guaiac positive.  She has severe  pain with digital examination.  EXTREMITIES:  No cyanosis or clubbing.  No decrease in sensation of her feet  or legs.  No joint deformity.  NEUROLOGIC:  The patient is obviously depressed, very flat affect.  She  appears somewhat anxious and withdrawn.  Cranial nerves are intact.  There  is no motor, sensory, or cerebellar deficit.  Deep tendon reflexes are  intact.  Gait and stance are altered by foot pain and by low back pain.   IMPRESSION:  1.  Recurrent rectal bleeding.  2.  Chronic abdominal pain, probably due to chronic adhesive disease, status      post extensive peritonitis and re-operation.  3.  Gastroesophageal reflux disease with dysphagia, status post recent      esophagogastroduodenoscopy with esophageal dilatation.  4.  Hemorrhoid disease, possibly contributing to #1.  5.  History of migraine headaches.  6.  Chronic depression with anxiety, progressive.  7.  Chronic low back pain.   PLAN:  The patient will be admitted.  She will receive intravenous  analgesics and antiemetics.  She had a CT scan done which  will need to be  followed up.  We will do a bowel prep and will have a colonoscopy the day  after admission.  Other studies will be based on the results of blood  testing and followup of her endoscopic procedure.     Lero   LCS/MEDQ  D:  12/04/2004  T:  12/04/2004  Job:  [id number]

## 2011-05-03 NOTE — Procedures (Signed)
NAME:  [name], [name]               ACCOUNT NO.:  [id number]   MEDICAL RECORD NO.:  [id number]          PATIENT TYPE:  INP   LOCATION:  A303                          FACILITY:  [facility]   PHYSICIAN:  Vida Roller, M.D.   DATE OF BIRTH:  Aug 07, 1961   DATE OF PROCEDURE:  06/10/2005  DATE OF DISCHARGE:                                  ECHOCARDIOGRAM   PRIMARY CARE PHYSICIAN:  Dirk Dress. Katrinka Blazing, M.D.   Tape Number [id number], tape count [id number] through 5297/   This is a 50 year old woman with peripheral edema.   The technical quality of this study is adequate.   M-MODE TRACINGS:  The aorta is 31 mm.   The left atrium is 31 mm.   The septum is 12 mm.   The posterior wall is 12 mm.   Left ventricular diastolic dimension is 36 mm.   Left ventricular systolic dimension is 26 mm.   2-D AND DOPPLER IMAGING:  The left ventricle is normal size with normal  systolic function.  Estimated ejection fraction 55 to 60%.  There were no  wall motion abnormalities seen.  There is mild concentric left ventricular  hypertrophy.   The right ventricle is dilated with what appears to be depressed RV systolic  function, although the RV is not well seen in multiple views.   The right atrium is enlarged.  The left atrium appears to be normal size.   The aortic valve does not appear to have any significant stenosis or  regurgitation.   The mitral valve has trace regurgitation.   The tricuspid valve has trace regurgitation.  There is no obvious effusion.   The inferior vena cava was not well seen.   The ascending aorta was not well seen.       JH/MEDQ  D:  06/10/2005  T:  06/10/2005  Job:  [id number]   cc:   Dirk Dress. Katrinka Blazing, M.D.  [address]  [place]  [place] [address]  Fax: [phone]

## 2011-05-03 NOTE — H&P (Signed)
NAME:  [name], [name]                           ACCOUNT NO.:  [id number]   MEDICAL RECORD NO.:  [id number]                   PATIENT TYPE:  INP   LOCATION:  A337                                 FACILITY:  [facility]   PHYSICIAN:  Dirk Dress. Katrinka Blazing, M.D.                DATE OF BIRTH:  1961/06/11   DATE OF ADMISSION:  08/30/2003  DATE OF DISCHARGE:                                HISTORY & PHYSICAL   BRIEF HISTORY:  A 50 year old female admitted through the emergency room for  treatment of severe abdominal pain with nausea, vomiting, and some diarrhea.  The patient states that she had onset of abdominal pain on the day prior to  admission.  She noted that she was having increased watery stool coming  through her ileostomy.  She described the stool as being clear.  She had  episodes of nausea with vomiting.  She denied fever or chills.  She states  that the pain was made worse by deep breathing and by sitting.  She was seen  in the emergency room where she was noted to be in moderate distress but  afebrile.  Her labs were basically normal.  Because of her severe discomfort  she is admitted for treatment.   PAST HISTORY:  The patient has a history of diverticulitis and is status  post subtotal colectomy for diverticulitis with abscess formation and  retroperitoneal abscess formation.  This was done in early August.  She has  had a temporary ileostomy since that time.  Other problems include anxiety  disorder and gastroesophageal reflux disease.   SURGICAL HISTORY:  Subtotal colectomy with ileostomy, hysterectomy, and  umbilical hernia repair.   SOCIAL HISTORY:  She is employed at [employer].  She  smokes a pack of cigarettes a day.  She drinks alcohol occasionally.   MEDICATIONS:  1. Lexapro 10 mg once daily.  2. Prevacid 30 mg once daily.  3. Tylox one q.4h. p.r.n.   PHYSICAL EXAMINATION:  VITAL SIGNS:  Blood pressure is 120/90, pulse 100,  respirations 18,  temperature 98 degrees.  HEENT:  Unremarkable.  NECK:  Supple without JVD or bruit.  CHEST:  Clear to auscultation; no rales, rubs, rhonchi, or wheezes.  HEART:  Regular rate and rhythm without murmur, gallop, or rub.  ABDOMEN:  Moderate epigastric and bilateral upper quadrant tenderness;  normoactive bowel sounds; functioning stoma right lower quadrant.  EXTREMITIES:  No cyanosis, clubbing, or edema.  NEUROLOGIC:  No focal motor or sensory or cerebellar deficits.   IMPRESSION:  1. Suspected acute gastroenteritis with nausea, vomiting, and diarrhea.  2. History of diverticulitis status post subtotal colectomy with ileostomy.  3. Anxiety disorder.  4. Gastroesophageal reflux disease.   PLAN:  The patient will be admitted, hydrated.  We will get stool cultures,  check stool for white cells.  Abdominal series will be done and other  treatment will  be given based on her clinical findings.                                               Dirk Dress. Katrinka Blazing, M.D.    LCS/MEDQ  D:  09/01/2003  T:  09/02/2003  Job:  [id number]

## 2011-05-03 NOTE — Discharge Summary (Signed)
NAME:  [name], [name]               ACCOUNT NO.:  [id number]   MEDICAL RECORD NO.:  [id number]          PATIENT TYPE:  INP   LOCATION:  A336                          FACILITY:  [facility]   PHYSICIAN:  Dirk Dress. Katrinka Blazing, M.D.   DATE OF BIRTH:  [birth year]   DATE OF ADMISSION:  10/21/2005  DATE OF DISCHARGE:  11/15/2006LH                                 DISCHARGE SUMMARY   DISCHARGE DIAGNOSES:  1.  Recurrent severe abdominal pain with nausea and vomiting, probably      related to irritable bowel syndrome with exacerbation.  2.  Hypokalemia.  3.  Chronic depression.  4.  Episodic diarrhea.  5.  Gastroesophageal reflux disease.  6.  New onset diabetes mellitus.  7.  Hypertension.  8.  Chronic depression.  9.  Migraine headaches.   DISPOSITION:  The patient is discharged home in stable, improved condition.   DISCHARGE MEDICATIONS:  1.  Methadone 20 mg t.i.d.  2.  Oxycodone 20 mg three p.o. t.i.d.  3.  Bentyl 20 mg q.i.d.  4.  Xanax 1 mg t.i.d.  5.  Levothyroxine 100 mcg daily.  6.  Demadex 20 mg two b.i.d.  7.  Cymbalta 60 mg daily.  8.  Requip 2 mg q.h.s.  9.  Zoloft 150 mg daily.  10. Prevacid 30 mg daily.  11. Reglan 10 mg before meals and at bedtime.  12. Zelnorm 6 mg b.i.d.  13. Potassium chloride 20 mEq three q.i.d.  14. Phenergan 25 mg q.i.d.   FOLLOWUP:  She will have her blood sugars and potassium checked in the  office in one week.  Will have routine follow up in the office in two weeks.   SUMMARY:  A 50 year old female with history of severe crampy abdominal pain.  Pain occurs every 15-20 minutes and last a few seconds.  She denied  diarrhea.  She denied rectal bleeding.  She was on OxyContin 20 mg t.i.d.,  but this did not control her pain.  The pain awakens her at night.  She was  seen in the office four times in the past month and was seen in the  emergency room three times in between these visits.  Her symptoms appeared  to be getting worse.  She had vomiting and  could not keep down her  medications.  CT of the abdomen done on admission was unremarkable.  Because  the patient could not tolerate medications orally, due to her nausea and  vomiting, it was felt that she needed hospitalization to control her nausea  and pain and to try to allow her oral medications to work.  She also had  hypokalemia from medications for the vomiting, and we needed to control her  hypokalemia, since we have been trying to control this for over a month as  an outpatient without success.  Significant past history is that she has had  severe abdominal pain after a bout of acute diverticulitis which led to  subtotal colectomy with ileostomy and later ileostomy closure.  She has had  worsening pain since that surgery.  She has chronic narcotic dependence  with  severe progressive depression, hypertension, migraine headaches and episodic  diarrhea.  The patient also was having an increasing episodes of swelling or  chronic peripheral edema.   PHYSICAL EXAMINATION:  GENERAL APPEARANCE:  On exam she was in severe  distress and constant pain.  She was retching.  VITAL SIGNS:  Blood pressure 126/82, pulse 100, respirations 20, weight 199  pounds.  HEENT:  There is no evidence of thrush of her oropharynx.  CHEST:  Clear.  HEART:  Regular.  ABDOMEN:  Diffuse tenderness over the entire abdomen with epigastric  tenderness, hypogastric tenderness with prominent tenderness in the right  lower quadrant around her old ileostomy site.  Abdominal wall was rigid with  guarding, and she had hyperactive bowel sounds.   HOSPITAL COURSE:  The patient was admitted, started on IV fluids, given IV  analgesics and antiemetics.  These are noted in the orders.  She continued  to have severe pain throughout her hospitalization.  She had hypokalemia  which was corrected with an IV and oral potassium.  It was felt that her  Torsemide was contributing to her hypokalemia also.  She started having   regular bowel movements by November 10, but her pain persisted.  She was  severely depressed and very sad.  She complained of leg pain, but no  abnormality was found.  She had a significant narcotic analgesic dependency.  Her Demerol that she had been taking in the past was switched to methadone,  and it was decided that we could try to control her pain with a combination  of methadone and OxyContin.  She would have periods of severe constipation  followed by appearance of diarrhea.  It was also noted during this  hospitalization that she had elevated serum glucose and hemoglobin A1C was  7.2 suggesting new onset of diarrhea.  Because her p.o. intake was marginal,  it was elected not to start her on an oral hypoglycemic agent at this time.  Her serum potassium was increased to four tablets t.i.d.  At the time of  discharge, the patient was mildly improved.  Her nausea and vomiting was  stable.  She was able to keep down her medications, but it was felt that we  had not found the source of her pain.  Her serum potassium was 3.6 at the  time of discharge.  BUN was 5, creatinine was 0.8.  Right lower extremity  ultrasound revealed no evidence of DVT.  The patient was discharged home,  improved but still moderately symptomatic.  It was felt that her pain is a  multifactorial pain related to her physical as well as psychosocial  problems.  This was discussed with the patient at length.  She was informed  that we would try to have her seen as an outpatient at a [facility] for  more diagnostic studies if her symptoms should worsen.      Dirk Dress. Katrinka Blazing, M.D.  Electronically Signed     LCS/MEDQ  D:  11/24/2005  T:  11/25/2005  Job:  [id number]

## 2011-05-03 NOTE — H&P (Signed)
NAME:  [name], [name]               ACCOUNT NO.:  [id number]   MEDICAL RECORD NO.:  [id number]          PATIENT TYPE:  INP   LOCATION:  A336                          FACILITY:  [facility]   PHYSICIAN:  Dirk Dress. Katrinka Blazing, M.D.   DATE OF BIRTH:  11-26-1961   DATE OF ADMISSION:  10/21/2005  DATE OF DISCHARGE:  LH                                HISTORY & PHYSICAL   HISTORY OF PRESENT ILLNESS:  The patient is a 50 year old female with a  history of severe crampy abdominal pain.  The pain occurs every 15 to 20  minutes, lasts for a few seconds.  She has not had diarrhea.  She has not  had rectal bleeding.  The patient has been OxyContin 40 mg three times a day  but this does not control her pain.  It awakens her at night.  She has been  seen in the office four times in the past month and she has been seen in the  emergency room three times in between.  Her symptoms seem to be getting  worse.  She has had nausea and vomiting and now cannot keep down  medications.  On the day of admission, CT scan of the abdomen was done and  this was unremarkable in the upper abdomen and pelvis.  Because the patient  cannot tolerate more medications due nausea and vomiting, it was felt that  she needs to have hospitalization in order to control her nausea and pain  and allow oral medications to work.  She has hypokalemia from multiple  episodes of vomiting.  She has been trying to control this for the past  month without success.   PAST MEDICAL HISTORY:  1.  She has had severe abdominal pain after a bout of acute diverticulitis      which lead to subtotal colectomy with ileostomy and later ileostomy was      closure.  She has had worsening pain since that surgery.  2.  She has developed chronic pain syndrome, chronic narcotic dependence.  3.  She also has hypertension.  4.  Depression.  5.  Recurrent migraine headaches.  6.  Episode of diarrhea.  7.  Gastroesophageal reflux disease.   MEDICATIONS:  1.  Reglan 5  mg a.c. and h.s.  2.  Cymbalta 60 mg daily.  3.  Oxycodone 40 mg three times a day.  4.  Torsemide 20 mg two twice daily.  5.  Phenergan 25 mg every four hours as needed.  6.  Levoxyl 100 mcg daily.  7.  Alprazolam 1 mg daily.  8.  Bentyl 20 mg every six hours.  9.  Nexium 40 mg daily.  10. Amitriptyline 25 mg at night.  11. Zoloft 75 mg daily.  12. Potassium 20 mEq three times a day.   PAST SURGICAL HISTORY:  1.  Cholecystectomy.  2.  Subtotal colectomy with ileostomy.  3.  Ileostomy closure.  4.  Umbilical hernia repair.  5.  Total abdominal hysterectomy.   REVIEW OF SYSTEMS:  Positive for chronic peripheral edema.  Chronic soft  tissue pain.  Depression.  Diarrhea.  Oropharyngeal numbness.  Increasing  dyspnea on exertion.   PHYSICAL EXAMINATION:  GENERAL:  The patient is in severe distress, in  constant pain.  She is retching.  VITAL SIGNS:  Blood pressure 126/82, pulse 100, respirations 20, weight 199  pounds.  HEENT:  Unremarkable.  Oropharynx appears unremarkable. There is no evidence  of thrush.  NECK:  Supple.  No JVD, bruits, adenopathy, or thyromegaly.  CHEST:  Clear to auscultation. No rales, rubs, rhonchi or wheezes.  HEART:  Regular rate and rhythm without murmur, gallop or rub.  ABDOMEN:  Diffuse tenderness over the entire abdomen with epigastric  tenderness, hypogastric tenderness with prominent tenderness in the right  lower quadrant around ileostomy site.  Abdominal wall is rigid with  guarding. She has hyperactive bowel sounds.  EXTREMITIES:  1+ peripheral edema of feet and lower legs.  No joint  deformity.  NEUROLOGIC:  No focal motor, sensory or cerebellar deficits.   IMPRESSION:  1.  Recurrent severe abdominal pain with nausea and vomiting.  2.  Hypokalemia secondary to #1.  3.  Chronic depression.  4.  Episodic diarrhea, possibly related to short gut syndrome.  5.  Gastroesophageal reflux disease.  6.  Hypertension.  7.  Chronic depression.  8.   Migraine headaches.   PLAN:  The patient is admitted to receive IV fluids and IV analgesics and  anti-emetics.  We will try to treat her nonoperatively since it is unlikely  that nonoperative therapy will alleviate her problem.  We may have to adjust  her narcotics but the adjustment cannot be made until she tolerates oral  medications well.  Adjustment in her antidepressive medications may also be  done.      Dirk Dress. Katrinka Blazing, M.D.  Electronically Signed     LCS/MEDQ  D:  10/23/2005  T:  10/24/2005  Job:  [id number]

## 2011-05-03 NOTE — Discharge Summary (Signed)
NAME:  [name], [name]               ACCOUNT NO.:  [id number]   MEDICAL RECORD NO.:  [id number]          PATIENT TYPE:  INP   LOCATION:  A304                          FACILITY:  [facility]   PHYSICIAN:  Dirk Dress. Katrinka Blazing, M.D.   DATE OF BIRTH:  [birth year]   DATE OF ADMISSION:  12/03/2004  DATE OF DISCHARGE:  12/22/2005LH                                 DISCHARGE SUMMARY   DISCHARGE DIAGNOSES:  1.  Acute rectal bleeding.  2.  Rectal ulcers.  3.  Internal hemorrhoids.  4.  Irritable bowel syndrome.  5.  History of diverticulitis.  6.  History of chronic gastritis.  7.  Gastroesophageal reflux disease with esophagitis.  8.  Severe migraine headache.  9.  Hypertension.  10. Chronic anemia.  11. Depression with anxiety.   SPECIAL PROCEDURE:  Total colonoscopy on December 20.   DISPOSITION:  The patient discharged home in stable improved condition.   DISCHARGE MEDICATIONS:  1.  Prevacid 30 mg q.d.  2.  Xanax 1 mg t.i.d.  3.  Zoloft 50 mg q.d.  4.  Lotrel 5/10 q.d.  5.  Demerol 50 mg q.4h. p.r.n. pain.  6.  Anusol AC suppositories.  7.  Bentyl 20 mg q.6h.  8.  Lexapro 1 tablet q.d.   The patient will be see in the office in one week.   SUMMARY:  A 50 year old female with new onset of rectal bleeding. The  bleeding started three days prior to admission. She was seen in the  emergency room two days prior to admission. No abnormalities were found. The  patient had some nausea. She was seen in the office and was felt to be in  severe distress. She has bright red bleeding per rectum, and she was  admitted for evaluation for urgent colonoscopy.   Past history is positive for diverticulitis, chronic esophagitis,  gastroesophageal reflux disease, chronic abdominal adhesive disease with  chronic abdominal pain, anemia, hypertension, and depression. Medications  and operations are given in the admission note.   PHYSICAL EXAMINATION:  GENERAL:  The patient appeared to be moderate  distress.  VITAL SIGNS:  Stable.  ABDOMEN:  Revealed diffuse tenderness which was much more prominent in the  right lower quadrant than the left lower quadrant.  RECTAL:  She had large tender hemorrhoids. Stool was guaiac positive. The  patient had severe pain with digital examination.  PSYCH:  She was obviously depressed, very anxious, and withdrawn.   She was admitted, started on a bowel prep. After bowel prep was completed,  she had colonoscopy on December 04, 2004. Bowel prep revealed acute rectal  ulcers with bleeding, internal hemorrhoids. The patient was treated with  Anusol suppositories and was given Bentyl for abdominal pain. She was  monitored for the next couple of days and seemed to improve except for her  lower abdominal pain. Rectal bleeding ceased. She was therefore discharged  on December 22 much improved with planned followup in the office.      LCS/MEDQ  D:  02/10/2005  T:  02/11/2005  Job:  [id number]

## 2011-06-02 ENCOUNTER — Observation Stay (HOSPITAL_COMMUNITY)
Admission: EM | Admit: 2011-06-02 | Discharge: 2011-06-03 | Disposition: A | Discharge status: Home / Self Care | Payer: Medicare Other | Attending: Internal Medicine | Admitting: Internal Medicine

## 2011-06-02 ENCOUNTER — Emergency Department (HOSPITAL_COMMUNITY): Payer: Medicare Other

## 2011-06-02 DIAGNOSIS — R110 Nausea: Secondary | ICD-10-CM | POA: Insufficient documentation

## 2011-06-02 DIAGNOSIS — F411 Generalized anxiety disorder: Secondary | ICD-10-CM | POA: Insufficient documentation

## 2011-06-02 DIAGNOSIS — E876 Hypokalemia: Secondary | ICD-10-CM | POA: Insufficient documentation

## 2011-06-02 DIAGNOSIS — E119 Type 2 diabetes mellitus without complications: Secondary | ICD-10-CM | POA: Insufficient documentation

## 2011-06-02 DIAGNOSIS — J209 Acute bronchitis, unspecified: Principal | ICD-10-CM | POA: Insufficient documentation

## 2011-06-02 DIAGNOSIS — Z79899 Other long term (current) drug therapy: Secondary | ICD-10-CM | POA: Insufficient documentation

## 2011-06-02 LAB — BASIC METABOLIC PANEL
Calcium: 10 mg/dL (ref 8.4–10.5)
Creatinine, Ser: 1.04 mg/dL (ref 0.50–1.10)
GFR calc non Af Amer: 56 mL/min — ABNORMAL LOW (ref >60)
Glucose, Bld: 132 mg/dL — ABNORMAL HIGH (ref 70–99)
Sodium: 140 mEq/L (ref 135–145)

## 2011-06-02 LAB — CBC
Hemoglobin: 13.8 g/dL (ref 12.0–15.0)
MCH: 30.9 pg (ref 26.0–34.0)
MCHC: 33.6 g/dL (ref 30.0–36.0)
MCV: 92.2 fL (ref 78.0–100.0)

## 2011-06-02 LAB — POCT I-STAT, CHEM 8
BUN: 6 mg/dL (ref 6–23)
Calcium, Ion: 1.07 mmol/L — ABNORMAL LOW (ref 1.12–1.32)
Chloride: 105 mEq/L (ref 96–112)
Glucose, Bld: 122 mg/dL — ABNORMAL HIGH (ref 70–99)
HCT: 41 % (ref 36.0–46.0)
TCO2: 22 mmol/L (ref 0–100)

## 2011-06-02 LAB — GLUCOSE, CAPILLARY: Glucose-Capillary: 148 mg/dL — ABNORMAL HIGH (ref 70–99)

## 2011-06-02 LAB — DIFFERENTIAL
Basophils Relative: 0 % (ref 0–1)
Eosinophils Absolute: 0 10*3/uL (ref 0.0–0.7)
Lymphs Abs: 1.1 10*3/uL (ref 0.7–4.0)
Monocytes Absolute: 0.2 10*3/uL (ref 0.1–1.0)
Monocytes Relative: 2 % — ABNORMAL LOW (ref 3–12)
Neutro Abs: 8.5 10*3/uL — ABNORMAL HIGH (ref 1.7–7.7)

## 2011-06-03 LAB — COMPREHENSIVE METABOLIC PANEL
ALT: 7 U/L (ref 0–35)
Alkaline Phosphatase: 55 U/L (ref 39–117)
BUN: 11 mg/dL (ref 6–23)
CO2: 26 mEq/L (ref 19–32)
Chloride: 103 mEq/L (ref 96–112)
GFR calc Af Amer: >60 mL/min (ref >60)
GFR calc non Af Amer: 55 mL/min — ABNORMAL LOW (ref >60)
Glucose, Bld: 148 mg/dL — ABNORMAL HIGH (ref 70–99)
Potassium: 2.7 mEq/L — CL (ref 3.5–5.1)
Sodium: 143 mEq/L (ref 135–145)
Total Bilirubin: 0.1 mg/dL — ABNORMAL LOW (ref 0.3–1.2)

## 2011-06-03 LAB — MAGNESIUM: Magnesium: 2 mg/dL (ref 1.5–2.5)

## 2011-06-03 NOTE — H&P (Signed)
[name], [name]               ACCOUNT NO.:  [id number]  MEDICAL RECORD NO.:  [id number]  LOCATION:  A323                          FACILITY:  [facility]  PHYSICIAN:   L. Lendell Caprice, MDDATE OF BIRTH:  1961-09-13  DATE OF ADMISSION:  06/02/2011 DATE OF DISCHARGE:  LH                             HISTORY & PHYSICAL   CHIEF COMPLAINT:  Cough.  HISTORY OF PRESENT ILLNESS:  [name] is a 50 year old black female who presents with cough, chest congestion, sore throat and chest tightness for several days.  Last night, she was up all night and felt worse so she came to the emergency room today.  She has received several nebulizers.  She does not feel short of breath at rest, but with exertion she feels short of breath.  Some of the chest tightness has improved, but she still does not feel well.  She has been drinking liquids, but has had anorexia.  She reports fevers of 101-102 at home. She has had no vomiting or diarrhea.  No rash or myalgias.  She has a throbbing headache.  She has had nonproductive cough, but feels a lot of chest congestion.  She reports that urge to breath.  She has also had a sore throat.  She is a remote smoker and her husband currently smokes. She is taking NyQuil, Alka-Seltzer cold and other over-the-counter remedies without any relief, so she came to the emergency room.  PAST MEDICAL HISTORY:  Diet-controlled diabetes, gastroesophageal reflux disease, history of irritable bowel syndrome, hypertension, anxiety, depression, chronic neck and back pain, history of asthma, reported history of COPD the patient denies this, frequent headaches and hypothyroidism.  MEDICATIONS: 1. Spironolactone 50 mg a day. 2. KCl 20 mEq a day. 3. Dicyclomine 20 mg p.o. q.a.c. 4. Gabapentin 300 mg p.o. b.i.d. 5. Levothyroxine 112 mcg a day. 6. Alprazolam 1 mg p.o. t.i.d. 7. Imipramine 10 mg nightly. 8. Fexofenadine 180 mg a day. 9. Seroquel 50 mg nightly. 10.Nexium 40 mg  twice a day. 11.Combivent as needed. 12.Caltrate daily. 13.Multivitamin a day.  She reports an intolerance to Dilaudid which causes itching.  SOCIAL HISTORY:  She is a former smoker.  She does not drink or use drugs.  She is newly married.  FAMILY HISTORY:  Significant for heart disease, hypertension.  PAST SURGICAL HISTORY:  She has had a previous colectomy for diverticulitis, hysterectomy and bilateral salpingo-oophorectomy.  PHYSICAL EXAMINATION:  VITAL SIGNS:  Temperature is 98.1, blood pressure 104/71, pulse 83, respiratory rate 22, oxygen saturation 94% on room air. GENERAL:  The patient is a black female lying comfortably on the stretcher with paroxysms of coughing. HEENT:  Normocephalic, atraumatic.  Pupils equal, round, reactive to light.  Moist mucous membranes.  Oropharynx is without exudate.  She does have some tonsillar swelling and erythema. NECK:  Supple.  No lymphadenopathy or thyromegaly. LUNGS:  With rhonchi and wheeze throughout.  Good air movement. CARDIOVASCULAR:  Regular rate and rhythm without murmurs, gallops or rubs. ABDOMEN:  Normal bowel sounds, soft, nontender and nondistended. GU:  Deferred. RECTAL:  Deferred. EXTREMITIES:  No clubbing, cyanosis or edema. SKIN:  No rash. PSYCHIATRIC:  Normal affect. NEUROLOGIC:  Alert and oriented.  Cranial nerves and sensorimotor exam are intact.  LABORATORY DATA:  Hemoglobin and hematocrit normal.  I-STAT is significant for a potassium of 3.3, ionized calcium of 1.7.  Chest x-ray shows no infiltrate.  ASSESSMENT AND PLAN: 1. Acute bronchitis with wheezing and reported history of chronic     obstructive pulmonary disease.  The patient will get doxycycline,     bronchodilators, prednisone and supportive care.  She will be     placed on 24-hour observation. 2. Diet-controlled diabetes.  This may become uncontrolled on steroids     and I will monitor blood glucoses. 3. Hypertension.  Continue outpatient  medications. 4. Hypothyroidism. 5. Depression and anxiety. 6. Gastroesophageal reflux disease. 7. Chronic neck and back pain. 8. Hypokalemia.  This will be repleted p.o.      L. Lendell Caprice, MD     CLS/MEDQ  D:  06/02/2011  T:  06/03/2011  Job:  [id number]  cc:   Lia Hopping, MD Fax: [phone]  Electronically Signed by Crista Curb MD on 06/03/2011 10:56:03 AM

## 2011-06-21 ENCOUNTER — Other Ambulatory Visit (HOSPITAL_COMMUNITY): Payer: Self-pay | Admitting: Internal Medicine

## 2011-06-21 DIAGNOSIS — Z139 Encounter for screening, unspecified: Secondary | ICD-10-CM

## 2011-06-24 ENCOUNTER — Ambulatory Visit (HOSPITAL_COMMUNITY): Payer: Medicare Other

## 2011-06-25 ENCOUNTER — Ambulatory Visit (HOSPITAL_COMMUNITY)
Admission: RE | Admit: 2011-06-25 | Discharge: 2011-06-25 | Disposition: A | Discharge status: Home / Self Care | Payer: Medicare Other | Source: Ambulatory Visit | Attending: Internal Medicine | Admitting: Internal Medicine

## 2011-06-25 DIAGNOSIS — Z1231 Encounter for screening mammogram for malignant neoplasm of breast: Secondary | ICD-10-CM | POA: Insufficient documentation

## 2011-06-25 DIAGNOSIS — Z139 Encounter for screening, unspecified: Secondary | ICD-10-CM

## 2011-09-11 LAB — COMPREHENSIVE METABOLIC PANEL
ALT: 19
Alkaline Phosphatase: 52
BUN: 12
CO2: 26
Calcium: 10
GFR calc non Af Amer: 55 — ABNORMAL LOW
Glucose, Bld: 117 — ABNORMAL HIGH
Sodium: 139
Total Protein: 7.1

## 2011-09-11 LAB — BASIC METABOLIC PANEL
BUN: 5 — ABNORMAL LOW
CO2: 31
Calcium: 8.9
Calcium: 9.2
Chloride: 99
Creatinine, Ser: 1.05
GFR calc Af Amer: >60
GFR calc non Af Amer: 51 — ABNORMAL LOW
Glucose, Bld: 143 — ABNORMAL HIGH
Potassium: 4
Sodium: 140

## 2011-09-11 LAB — CBC
HCT: 36
Hemoglobin: 11.2 — ABNORMAL LOW
Hemoglobin: 12.5
MCHC: 34.5
MCHC: 34.6
MCV: 91.4
Platelets: 259
RBC: 4
RDW: 15
RDW: 15
RDW: 15.2
WBC: 9

## 2011-09-11 LAB — DIFFERENTIAL
Basophils Absolute: 0
Basophils Absolute: 0
Basophils Relative: 0
Eosinophils Absolute: 0.1
Lymphocytes Relative: 31
Lymphs Abs: 2.7
Monocytes Absolute: 0.7
Neutro Abs: 5
Neutro Abs: 9.5 — ABNORMAL HIGH
Neutrophils Relative %: 75

## 2011-09-11 LAB — TYPE AND SCREEN
ABO/RH(D): O POS
Antibody Screen: NEGATIVE

## 2011-09-11 LAB — URINALYSIS, ROUTINE W REFLEX MICROSCOPIC
Glucose, UA: NEGATIVE
Hgb urine dipstick: NEGATIVE
Ketones, ur: NEGATIVE
Protein, ur: NEGATIVE
Urobilinogen, UA: 0.2

## 2011-09-17 LAB — COMPREHENSIVE METABOLIC PANEL
ALT: 36 — ABNORMAL HIGH
Alkaline Phosphatase: 64
CO2: 28
GFR calc non Af Amer: 37 — ABNORMAL LOW
Glucose, Bld: 99
Potassium: 5.5 — ABNORMAL HIGH
Sodium: 141
Total Bilirubin: 0.3

## 2011-09-17 LAB — LIPASE, BLOOD: Lipase: 25

## 2011-09-17 LAB — URINALYSIS, ROUTINE W REFLEX MICROSCOPIC
Bilirubin Urine: NEGATIVE
Glucose, UA: NEGATIVE
Nitrite: NEGATIVE
Specific Gravity, Urine: 1.025
pH: 5.5

## 2011-09-17 LAB — DIFFERENTIAL
Basophils Absolute: 0
Lymphocytes Relative: 31
Neutro Abs: 5.6
Neutrophils Relative %: 59

## 2011-09-17 LAB — PROTIME-INR
INR: 1.2
Prothrombin Time: 15.4 — ABNORMAL HIGH

## 2011-09-17 LAB — CBC
Hemoglobin: 11.5 — ABNORMAL LOW
RBC: 3.77 — ABNORMAL LOW

## 2011-09-24 ENCOUNTER — Emergency Department (HOSPITAL_COMMUNITY)
Admission: EM | Admit: 2011-09-24 | Discharge: 2011-09-24 | Disposition: A | Discharge status: Home / Self Care | Payer: Medicare Other | Attending: Emergency Medicine | Admitting: Emergency Medicine

## 2011-09-24 ENCOUNTER — Encounter (HOSPITAL_COMMUNITY): Payer: Self-pay | Admitting: *Deleted

## 2011-09-24 DIAGNOSIS — R197 Diarrhea, unspecified: Secondary | ICD-10-CM | POA: Insufficient documentation

## 2011-09-24 DIAGNOSIS — Z79899 Other long term (current) drug therapy: Secondary | ICD-10-CM | POA: Insufficient documentation

## 2011-09-24 DIAGNOSIS — R509 Fever, unspecified: Secondary | ICD-10-CM | POA: Insufficient documentation

## 2011-09-24 DIAGNOSIS — K529 Noninfective gastroenteritis and colitis, unspecified: Secondary | ICD-10-CM

## 2011-09-24 DIAGNOSIS — R109 Unspecified abdominal pain: Secondary | ICD-10-CM | POA: Insufficient documentation

## 2011-09-24 DIAGNOSIS — R112 Nausea with vomiting, unspecified: Secondary | ICD-10-CM | POA: Insufficient documentation

## 2011-09-24 DIAGNOSIS — K5289 Other specified noninfective gastroenteritis and colitis: Secondary | ICD-10-CM | POA: Insufficient documentation

## 2011-09-24 LAB — COMPREHENSIVE METABOLIC PANEL
ALT: 16 U/L (ref 0–35)
AST: 16 U/L (ref 0–37)
CO2: 23 mEq/L (ref 19–32)
Calcium: 8.6 mg/dL (ref 8.4–10.5)
GFR calc non Af Amer: 81 mL/min — ABNORMAL LOW (ref >90)
Sodium: 138 mEq/L (ref 135–145)

## 2011-09-24 LAB — CBC
MCV: 92.4 fL (ref 78.0–100.0)
Platelets: 197 10*3/uL (ref 150–400)
RDW: 14.2 % (ref 11.5–15.5)
WBC: 4.5 10*3/uL (ref 4.0–10.5)

## 2011-09-24 LAB — DIFFERENTIAL
Basophils Absolute: 0 10*3/uL (ref 0.0–0.1)
Eosinophils Relative: 5 % (ref 0–5)
Lymphocytes Relative: 52 % — ABNORMAL HIGH (ref 12–46)
Neutro Abs: 1.7 10*3/uL (ref 1.7–7.7)

## 2011-09-24 MED ORDER — ONDANSETRON HCL 4 MG/2ML IJ SOLN
4.0000 mg | Freq: Once | INTRAMUSCULAR | Status: AC
  Administered 2011-09-24: 4 mg via INTRAVENOUS
  Filled 2011-09-24: qty 2

## 2011-09-24 MED ORDER — SODIUM CHLORIDE 0.9 % IV SOLN
Freq: Once | INTRAVENOUS | Status: AC
  Administered 2011-09-24: 17:00:00 via INTRAVENOUS

## 2011-09-24 MED ORDER — MORPHINE SULFATE 4 MG/ML IJ SOLN
4.0000 mg | Freq: Once | INTRAMUSCULAR | Status: AC
  Administered 2011-09-24: 4 mg via INTRAVENOUS
  Filled 2011-09-24: qty 1

## 2011-09-24 MED ORDER — KETOROLAC TROMETHAMINE 30 MG/ML IJ SOLN
30.0000 mg | Freq: Once | INTRAMUSCULAR | Status: AC
  Administered 2011-09-24: 30 mg via INTRAVENOUS
  Filled 2011-09-24: qty 1

## 2011-09-24 MED ORDER — HYDROCODONE-ACETAMINOPHEN 5-500 MG PO TABS: 1.0000 | ORAL_TABLET | Freq: Four times a day (QID) | ORAL | Status: AC | PRN

## 2011-09-24 MED ORDER — PROMETHAZINE HCL 25 MG PO TABS: 25.0000 mg | ORAL_TABLET | Freq: Four times a day (QID) | ORAL | Status: AC | PRN

## 2011-09-24 NOTE — ED Provider Notes (Signed)
History     CSN: [id number] Arrival date & time: 09/24/2011  4:27 PM  Chief Complaint  Patient presents with  . Abdominal Cramping  . Nausea  . Diarrhea    (Consider location/radiation/quality/duration/timing/severity/associated sxs/prior treatment) Patient is a 50 y.o. female presenting with cramps and diarrhea. The history is provided by the patient.  Abdominal Cramping The primary symptoms of the illness include abdominal pain, fatigue, nausea, vomiting and diarrhea. The primary symptoms of the illness do not include fever, hematemesis, hematochezia or dysuria. The current episode started more than 2 days ago. The onset of the illness was sudden. The problem has been gradually worsening.  The patient states that she believes she is currently not pregnant. The patient has had a change in bowel habit. Symptoms associated with the illness do not include chills, anorexia, diaphoresis, constipation, urgency, hematuria, frequency or back pain. Significant associated medical issues do not include PUD or GERD. Associated medical issues comments: lap chole, ruptured diverticulitis with ileostomy and subsequent reversal.  Diarrhea The primary symptoms include fatigue, abdominal pain, nausea, vomiting and diarrhea. Primary symptoms do not include fever, hematemesis, hematochezia or dysuria.  The illness does not include chills, anorexia, constipation or back pain. Associated medical issues do not include GERD or PUD. Associated medical issues comments: lap chole, ruptured diverticulitis with ileostomy and subsequent reversal.    Past Medical History  Diagnosis Date  . HTN (hypertension)   . GERD (gastroesophageal reflux disease)   . Obesity   . Anxiety   . Depression   . IBS (irritable bowel syndrome)   . Migraine   . Chronic back pain   . Neck pain, chronic   . Diverticulitis     complicated by abscess requiring total colectomy and take-down of colostomy in 2004  . Asthma   . Terminal  ileitis of small intestine     referred to [facility], Crohn's ruled out    Past Surgical History  Procedure Date  . Subtotal colectomy 2004    diverticulitis with abscess  . Hysterectomy     partial  . Esophagogastroduodenoscopy 02/2009    mild erosive reflux esophagitis  . Colonoscopy 03/2009    anastomotic ulcers and neoterminal ileum ulcers, anastomosis with SB at 30cm  . Esophagogastroduodenoscopy 07/2010    probable cervical esophageal web s/p dilation  . Umbilical hernia repair   . Cholecystectomy 2004  . Liver biopsy 2004    steatosis  . Appendectomy   . Abdominal hysterectomy     Family History  Problem Relation Age of Onset  . ALS Father   . Crohn's disease Daughter     History  Substance Use Topics  . Smoking status: Former Games developer  . Smokeless tobacco: Never Used  . Alcohol Use: No    OB History    Grav Para Term Preterm Abortions TAB SAB Ect Mult Living                  Review of Systems  Constitutional: Positive for fatigue. Negative for fever, chills and diaphoresis.  Gastrointestinal: Positive for nausea, vomiting, abdominal pain and diarrhea. Negative for constipation, hematochezia, anorexia and hematemesis.  Genitourinary: Negative for dysuria, urgency, frequency and hematuria.  Musculoskeletal: Negative for back pain.  All other systems reviewed and are negative.    Allergies  Hydromorphone hcl  Home Medications   Current Outpatient Rx  Name Route Sig Dispense Refill  . IPRATROPIUM-ALBUTEROL 18-103 MCG/ACT IN AERO Inhalation Inhale 2 puffs into the lungs every 6 (six) hours as  needed.      . ALPRAZOLAM 1 MG PO TABS Oral Take 1 mg by mouth daily.      Marland Kitchen DICYCLOMINE HCL 10 MG PO CAPS Oral Take 10 mg by mouth 4 (four) times daily -  before meals and at bedtime.      Marland Kitchen ESOMEPRAZOLE MAGNESIUM 40 MG PO CPDR Oral Take 40 mg by mouth 2 (two) times daily.      Marland Kitchen FEXOFENADINE HCL 180 MG PO TABS Oral Take 180 mg by mouth daily.      Marland Kitchen GABAPENTIN 300 MG  PO CAPS Oral Take 300 mg by mouth 2 (two) times daily.      Marland Kitchen HYDROCODONE-ACETAMINOPHEN 10-325 MG PO TABS Oral Take 1 tablet by mouth every 6 (six) hours as needed.      . IMIPRAMINE HCL 10 MG PO TABS Oral Take 40 mg by mouth at bedtime.      Marland Kitchen LEVOTHYROXINE SODIUM 112 MCG PO TABS Oral Take 112 mcg by mouth daily.      Marland Kitchen POTASSIUM CHLORIDE 10 MEQ PO TBCR Oral Take 20 mEq by mouth daily.      . QUETIAPINE FUMARATE 50 MG PO TABS Oral Take 50 mg by mouth at bedtime.      . SPIRONOLACTONE 50 MG PO TABS Oral Take 50 mg by mouth daily.      . SUMATRIPTAN SUCCINATE 100 MG PO TABS Oral Take 100 mg by mouth every 2 (two) hours as needed.        BP 136/97  Pulse 78  Temp(Src) 98.6 F (37 C) (Oral)  Resp 18  Ht 5\' 3"  (1.6 m)  Wt 176 lb (79.833 kg)  BMI 31.18 kg/m2  SpO2 100%  Physical Exam  Constitutional: She is oriented to person, place, and time. She appears well-developed and well-nourished. No distress.  HENT:  Head: Normocephalic and atraumatic.  Mouth/Throat: Oropharynx is clear and moist.  Neck: Normal range of motion. Neck supple.  Cardiovascular: Normal rate and regular rhythm.  Exam reveals no gallop and no friction rub.   No murmur heard. Pulmonary/Chest: Effort normal. She has no wheezes. She has no rales.  Abdominal: Soft. Bowel sounds are normal. She exhibits no distension. There is no tenderness.  Musculoskeletal: Normal range of motion. She exhibits no edema.  Neurological: She is alert and oriented to person, place, and time.  Skin: Skin is warm and dry. She is not diaphoretic.    ED Course  Procedures (including critical care time)  Labs Reviewed - No data to display No results found.   No diagnosis found.    MDM  Labs okay, feels better with meds.  Will treat pain and nausea, give time.  To return if worsens.        Geoffery Lyons, MD 09/24/11 [phone]

## 2011-09-24 NOTE — ED Notes (Signed)
Pt c/o abd cramping with nausea and diarrhea. Pt states she did vomit on Saturday and once yesterday but none today.

## 2012-04-28 ENCOUNTER — Emergency Department (HOSPITAL_COMMUNITY)
Admission: EM | Admit: 2012-04-28 | Discharge: 2012-04-28 | Disposition: A | Discharge status: Home / Self Care | Payer: Medicare Other | Attending: Emergency Medicine | Admitting: Emergency Medicine

## 2012-04-28 ENCOUNTER — Encounter (HOSPITAL_COMMUNITY): Payer: Self-pay | Admitting: *Deleted

## 2012-04-28 DIAGNOSIS — M549 Dorsalgia, unspecified: Secondary | ICD-10-CM | POA: Insufficient documentation

## 2012-04-28 DIAGNOSIS — F329 Major depressive disorder, single episode, unspecified: Secondary | ICD-10-CM | POA: Insufficient documentation

## 2012-04-28 DIAGNOSIS — J45909 Unspecified asthma, uncomplicated: Secondary | ICD-10-CM | POA: Insufficient documentation

## 2012-04-28 DIAGNOSIS — F411 Generalized anxiety disorder: Secondary | ICD-10-CM | POA: Insufficient documentation

## 2012-04-28 DIAGNOSIS — I10 Essential (primary) hypertension: Secondary | ICD-10-CM | POA: Insufficient documentation

## 2012-04-28 DIAGNOSIS — G43909 Migraine, unspecified, not intractable, without status migrainosus: Secondary | ICD-10-CM | POA: Insufficient documentation

## 2012-04-28 DIAGNOSIS — F3289 Other specified depressive episodes: Secondary | ICD-10-CM | POA: Insufficient documentation

## 2012-04-28 DIAGNOSIS — K219 Gastro-esophageal reflux disease without esophagitis: Secondary | ICD-10-CM | POA: Insufficient documentation

## 2012-04-28 DIAGNOSIS — E669 Obesity, unspecified: Secondary | ICD-10-CM | POA: Insufficient documentation

## 2012-04-28 DIAGNOSIS — K589 Irritable bowel syndrome without diarrhea: Secondary | ICD-10-CM | POA: Insufficient documentation

## 2012-04-28 DIAGNOSIS — M542 Cervicalgia: Secondary | ICD-10-CM | POA: Insufficient documentation

## 2012-04-28 DIAGNOSIS — S2095XA Superficial foreign body of unspecified parts of thorax, initial encounter: Secondary | ICD-10-CM | POA: Insufficient documentation

## 2012-04-28 DIAGNOSIS — W57XXXA Bitten or stung by nonvenomous insect and other nonvenomous arthropods, initial encounter: Secondary | ICD-10-CM

## 2012-04-28 HISTORY — DX: Spotted fever due to Rickettsia rickettsii: A77.0

## 2012-04-28 MED ORDER — DOXYCYCLINE HYCLATE 100 MG PO TABS: 100.0000 mg | ORAL_TABLET | Freq: Two times a day (BID) | ORAL | Status: AC

## 2012-04-28 NOTE — ED Notes (Signed)
Removed a tick from RLQ on Saturday,  Thinks the head may still be in the skin, Hx of RMSF and worried about that.

## 2012-04-28 NOTE — Discharge Instructions (Signed)
Wood Tick Bite Ticks are insects that attach themselves to the skin. Most tick bites are harmless, but sometimes ticks carry diseases that can make a person quite ill. The chance of getting ill depends on:  The kind of tick that bites you.   Time of year.   How long the tick is attached.   Geographic location.  Wood ticks are also called dog ticks. They are generally black. They can have white markings. They live in shrubs and grassy areas. They are larger than deer ticks. Wood ticks are about the size of a watermelon seed. They have a hard body. The most common places for ticks to attach themselves are the scalp, neck, armpits, waist, and groin. Wood ticks may stay attached for up to 2 weeks. TICKS MUST BE REMOVED AS SOON AS POSSIBLE TO HELP PREVENT DISEASES CAUSED BY TICK BITES.  TO REMOVE A TICK: 1. If available, put on latex gloves before trying to remove a tick.  2. Grasp the tick as close to the skin as possible, with curved forceps, fine tweezers or a special tick removal tool.  3. Pull gently with steady pressure until the tick lets go. Do not twist the tick or jerk it suddenly. This may break off the tick's head or mouth parts.  4. Do not crush the tick's body. This could force disease-carrying fluids from the tick into your body.  5. After the tick is removed, wash the bite area and your hands with soap and water or other disinfectant.  6. Apply a small amount of antiseptic cream or ointment to the bite site.  7. Wash and disinfect any instruments that were used.  8. Save the tick in a jar or plastic bag for later identification. Preserve the tick with a bit of alcohol or put it in the freezer.  9. Do not apply a hot match, petroleum jelly, or fingernail polish to the tick. This does not work and may increase the chances of disease from the tick bite.  YOU MAY NEED TO SEE YOUR CAREGIVER FOR A TETANUS SHOT NOW IF:  You have no idea when you had the last one.   You have never had a  tetanus shot before.  If you need a tetanus shot, and you decide not to get one, there is a rare chance of getting tetanus. Sickness from tetanus can be serious. If you get a tetanus shot, your arm may swell, get red and warm to the touch at the shot site. This is common and not a problem. PREVENTION  Wear protective clothing. Long sleeves and pants are best.   Wear white clothes to see ticks more easily   Tuck your pant legs into your socks.   If walking on trail, stay in the middle of the trail to avoid brushing against bushes.   Put insect repellent on all exposed skin and along boot tops, pant legs and sleeve cuffs   Check clothing, hair and skin repeatedly and before coming inside.   Brush off any ticks that are not attached.  SEEK MEDICAL CARE IF:   You cannot remove a tick or part of the tick that is left in the skin.   Unexplained fever.   Redness and swelling in the area of the tick bite.   Tender, swollen lymph glands.   Diarrhea.   Weight loss.   Cough.   Fatigue.   Muscle, joint or bone pain.   Belly pain.   Headache.   Rash.    SEEK IMMEDIATE MEDICAL CARE IF:   You develop an oral temperature above 102 F (38.9 C).   You are having trouble walking or moving your legs.   Numbness in the legs.   Shortness of breath.   Confusion.   Repeated vomiting.  Document Released: 11/29/2000 Document Revised: 11/21/2011 Document Reviewed: 11/07/2008 [employer] Patient Information 2012 [employer]. 

## 2012-04-28 NOTE — ED Notes (Signed)
[facility]obson pa to see pt.

## 2012-04-28 NOTE — ED Provider Notes (Signed)
History     CSN: [id number]  Arrival date & time 04/28/12  1047   First MD Initiated Contact with Patient 04/28/12 1110      Chief Complaint  Patient presents with  . Tick Removal    (Consider location/radiation/quality/duration/timing/severity/associated sxs/prior treatment) HPI Comments: Pt sustained a tick  Bite to the RLQ , removed tick on May 11, but thinks the "head" may still be in place.  The history is provided by the patient.    Past Medical History  Diagnosis Date  . HTN (hypertension)   . GERD (gastroesophageal reflux disease)   . Obesity   . Anxiety   . Depression   . IBS (irritable bowel syndrome)   . Migraine   . Chronic back pain   . Neck pain, chronic   . Diverticulitis     complicated by abscess requiring total colectomy and take-down of colostomy in 2004  . Asthma   . Terminal ileitis of small intestine     referred to [facility], Crohn's ruled out  . [place] spotted fever     Past Surgical History  Procedure Date  . Subtotal colectomy 2004    diverticulitis with abscess  . Hysterectomy     partial  . Esophagogastroduodenoscopy 02/2009    mild erosive reflux esophagitis  . Colonoscopy 03/2009    anastomotic ulcers and neoterminal ileum ulcers, anastomosis with SB at 30cm  . Esophagogastroduodenoscopy 07/2010    probable cervical esophageal web s/p dilation  . Umbilical hernia repair   . Cholecystectomy 2004  . Liver biopsy 2004    steatosis  . Appendectomy   . Abdominal hysterectomy     Family History  Problem Relation Age of Onset  . ALS Father   . Crohn's disease Daughter   . Diabetes Mother   . Rheum arthritis Mother     History  Substance Use Topics  . Smoking status: Former Games developer  . Smokeless tobacco: Never Used  . Alcohol Use: No    OB History    Grav Para Term Preterm Abortions TAB SAB Ect Mult Living                  Review of Systems  Constitutional: Negative for fever and activity change.       All ROS  Neg except as noted in HPI  HENT: Negative for nosebleeds and neck pain.   Eyes: Negative for photophobia and discharge.  Respiratory: Negative for cough, shortness of breath and wheezing.   Cardiovascular: Negative for chest pain and palpitations.  Gastrointestinal: Negative for abdominal pain and blood in stool.  Genitourinary: Negative for dysuria, frequency and hematuria.  Musculoskeletal: Negative for back pain and arthralgias.  Skin: Negative.  Negative for rash.  Neurological: Negative for dizziness, seizures and speech difficulty.  Psychiatric/Behavioral: Negative for hallucinations and confusion.    Allergies  Hydromorphone hcl  Home Medications   Current Outpatient Rx  Name Route Sig Dispense Refill  . IPRATROPIUM-ALBUTEROL 18-103 MCG/ACT IN AERO Inhalation Inhale 2 puffs into the lungs every 6 (six) hours as needed. For shortness of breath    . ALPRAZOLAM 1 MG PO TABS Oral Take 1 mg by mouth 3 (three) times daily.     Marland Kitchen CALTRATE 600 PLUS-VIT D PO Oral Take 1 tablet by mouth daily. Alternating each week with Womens One-A-Day     . DICYCLOMINE HCL 10 MG PO CAPS Oral Take 10 mg by mouth 4 (four) times daily -  before meals and at bedtime.      Marland Kitchen  DOXYCYCLINE HYCLATE 100 MG PO TABS Oral Take 1 tablet (100 mg total) by mouth 2 (two) times daily. 14 tablet 0  . ESOMEPRAZOLE MAGNESIUM 40 MG PO CPDR Oral Take 40 mg by mouth 2 (two) times daily.      Marland Kitchen FEXOFENADINE HCL 180 MG PO TABS Oral Take 180 mg by mouth daily.      Marland Kitchen GABAPENTIN 300 MG PO CAPS Oral Take 300 mg by mouth 2 (two) times daily.      Marland Kitchen HYDROCODONE-ACETAMINOPHEN 10-325 MG PO TABS Oral Take 1 tablet by mouth 2 (two) times daily as needed. For pain    . IMIPRAMINE HCL 10 MG PO TABS Oral Take 40 mg by mouth at bedtime.      Marland Kitchen LEVOTHYROXINE SODIUM 112 MCG PO TABS Oral Take 112 mcg by mouth daily.      Marygrace Drought WOMENS FORMULA PO Oral Take 1 tablet by mouth daily. Alternating each week with Caltrate w/D     . POTASSIUM  CHLORIDE 10 MEQ PO TBCR Oral Take 10 mEq by mouth daily.     . QUETIAPINE FUMARATE 50 MG PO TABS Oral Take 50 mg by mouth at bedtime.      . SPIRONOLACTONE 50 MG PO TABS Oral Take 50 mg by mouth 2 (two) times daily.     . SUMATRIPTAN SUCCINATE 100 MG PO TABS Oral Take 100 mg by mouth every 2 (two) hours as needed. For migraines      BP 117/84  Pulse 79  Temp(Src) 98.3 F (36.8 C) (Oral)  Resp 20  Ht 5\' 3"  (1.6 m)  Wt 170 lb (77.111 kg)  BMI 30.11 kg/m2  SpO2 100%  Physical Exam  Nursing note and vitals reviewed. Constitutional: She is oriented to person, place, and time. She appears well-developed and well-nourished.  Non-toxic appearance.  HENT:  Head: Normocephalic.  Right Ear: Tympanic membrane and external ear normal.  Left Ear: Tympanic membrane and external ear normal.  Eyes: EOM and lids are normal. Pupils are equal, round, and reactive to light.  Neck: Normal range of motion. Neck supple. Carotid bruit is not present.  Cardiovascular: Normal rate, regular rhythm, normal heart sounds, intact distal pulses and normal pulses.   Pulmonary/Chest: Breath sounds normal. No respiratory distress.  Abdominal: Soft. Bowel sounds are normal. There is no tenderness. There is no guarding.       Small red area an the right lower abd wall with question of fb in the center. No red streaking noted.  Musculoskeletal: Normal range of motion.  Lymphadenopathy:       Head (right side): No submandibular adenopathy present.       Head (left side): No submandibular adenopathy present.    She has no cervical adenopathy.  Neurological: She is alert and oriented to person, place, and time. She has normal strength. No cranial nerve deficit or sensory deficit.  Skin: Skin is warm and dry.  Psychiatric: She has a normal mood and affect. Her speech is normal.    ED Course  Procedures - Foreign Body Removal:Pt ID by arm band. Permission given by the pt.  The right lower abd wall was painted with  alcohol and betadine. The fb (body part of insect) was removed with an 18 gauge needle. All portions removed as inspected with magnification. Sterile dressing applied to site by me. Pt tolerate procedure without problem  Labs Reviewed - No data to display No results found.   1. Tick bite  MDM  I have reviewed nursing notes, vital signs, and all appropriate lab and imaging results for this patient. FB removed form right lower abd wall with out problem. Pt has has RMSF in the past, Rx for doxycycline 7 day course given. Pt to return if any changes or problem.       Kathie Dike, [place] 04/30/12 [phone]

## 2012-04-30 NOTE — ED Provider Notes (Signed)
Medical screening examination/treatment/procedure(s) were performed by non-physician practitioner and as supervising physician I was immediately available for consultation/collaboration.    M , DO 04/30/12 1122 

## 2012-05-29 ENCOUNTER — Other Ambulatory Visit (HOSPITAL_COMMUNITY): Payer: Self-pay | Admitting: Internal Medicine

## 2012-05-29 DIAGNOSIS — Z139 Encounter for screening, unspecified: Secondary | ICD-10-CM

## 2012-06-25 ENCOUNTER — Ambulatory Visit (HOSPITAL_COMMUNITY)
Admission: RE | Admit: 2012-06-25 | Discharge: 2012-06-25 | Disposition: A | Discharge status: Home / Self Care | Payer: Medicare Other | Source: Ambulatory Visit | Attending: Internal Medicine | Admitting: Internal Medicine

## 2012-06-25 DIAGNOSIS — Z139 Encounter for screening, unspecified: Secondary | ICD-10-CM

## 2012-06-25 DIAGNOSIS — Z1231 Encounter for screening mammogram for malignant neoplasm of breast: Secondary | ICD-10-CM | POA: Insufficient documentation

## 2014-01-11 ENCOUNTER — Emergency Department (HOSPITAL_COMMUNITY)
Admission: EM | Admit: 2014-01-11 | Discharge: 2014-01-12 | Disposition: A | Discharge status: Home / Self Care | Payer: Medicare Other | Attending: Emergency Medicine | Admitting: Emergency Medicine

## 2014-01-11 ENCOUNTER — Encounter (HOSPITAL_COMMUNITY): Payer: Self-pay | Admitting: Emergency Medicine

## 2014-01-11 DIAGNOSIS — J029 Acute pharyngitis, unspecified: Secondary | ICD-10-CM | POA: Insufficient documentation

## 2014-01-11 DIAGNOSIS — I10 Essential (primary) hypertension: Secondary | ICD-10-CM | POA: Insufficient documentation

## 2014-01-11 DIAGNOSIS — F3289 Other specified depressive episodes: Secondary | ICD-10-CM | POA: Insufficient documentation

## 2014-01-11 DIAGNOSIS — Z79899 Other long term (current) drug therapy: Secondary | ICD-10-CM | POA: Insufficient documentation

## 2014-01-11 DIAGNOSIS — F411 Generalized anxiety disorder: Secondary | ICD-10-CM | POA: Insufficient documentation

## 2014-01-11 DIAGNOSIS — G43909 Migraine, unspecified, not intractable, without status migrainosus: Secondary | ICD-10-CM | POA: Insufficient documentation

## 2014-01-11 DIAGNOSIS — E669 Obesity, unspecified: Secondary | ICD-10-CM | POA: Insufficient documentation

## 2014-01-11 DIAGNOSIS — F17200 Nicotine dependence, unspecified, uncomplicated: Secondary | ICD-10-CM | POA: Insufficient documentation

## 2014-01-11 DIAGNOSIS — F329 Major depressive disorder, single episode, unspecified: Secondary | ICD-10-CM | POA: Insufficient documentation

## 2014-01-11 DIAGNOSIS — R0789 Other chest pain: Secondary | ICD-10-CM | POA: Insufficient documentation

## 2014-01-11 DIAGNOSIS — K219 Gastro-esophageal reflux disease without esophagitis: Secondary | ICD-10-CM | POA: Insufficient documentation

## 2014-01-11 DIAGNOSIS — T484X5A Adverse effect of expectorants, initial encounter: Secondary | ICD-10-CM | POA: Insufficient documentation

## 2014-01-11 DIAGNOSIS — R079 Chest pain, unspecified: Secondary | ICD-10-CM

## 2014-01-11 DIAGNOSIS — K589 Irritable bowel syndrome without diarrhea: Secondary | ICD-10-CM | POA: Insufficient documentation

## 2014-01-11 DIAGNOSIS — G8929 Other chronic pain: Secondary | ICD-10-CM | POA: Insufficient documentation

## 2014-01-11 DIAGNOSIS — J45909 Unspecified asthma, uncomplicated: Secondary | ICD-10-CM | POA: Insufficient documentation

## 2014-01-11 DIAGNOSIS — T7840XA Allergy, unspecified, initial encounter: Secondary | ICD-10-CM

## 2014-01-11 NOTE — ED Notes (Signed)
Patient states she took a Mucinex around 2230 and about 20 minutes later began having shortness of breath and chest pressure.  Patient states she has taken Mucinex before and this has never happened.

## 2014-01-12 ENCOUNTER — Emergency Department (HOSPITAL_COMMUNITY): Payer: Medicare Other

## 2014-01-12 LAB — BASIC METABOLIC PANEL
BUN: 11 mg/dL (ref 6–23)
CHLORIDE: 105 meq/L (ref 96–112)
CO2: 21 mEq/L (ref 19–32)
Calcium: 9.7 mg/dL (ref 8.4–10.5)
Creatinine, Ser: 1.09 mg/dL (ref 0.50–1.10)
GFR, EST AFRICAN AMERICAN: 65 mL/min — AB (ref >90)
GFR, EST NON AFRICAN AMERICAN: 56 mL/min — AB (ref >90)
Glucose, Bld: 142 mg/dL — ABNORMAL HIGH (ref 70–99)
POTASSIUM: 3.7 meq/L (ref 3.7–5.3)
SODIUM: 143 meq/L (ref 137–147)

## 2014-01-12 LAB — CBC
HEMATOCRIT: 40.8 % (ref 36.0–46.0)
Hemoglobin: 14 g/dL (ref 12.0–15.0)
MCH: 31.7 pg (ref 26.0–34.0)
MCHC: 34.3 g/dL (ref 30.0–36.0)
MCV: 92.3 fL (ref 78.0–100.0)
Platelets: 239 10*3/uL (ref 150–400)
RBC: 4.42 MIL/uL (ref 3.87–5.11)
RDW: 14.1 % (ref 11.5–15.5)
WBC: 9.7 10*3/uL (ref 4.0–10.5)

## 2014-01-12 LAB — TROPONIN I: Troponin I: <0.3 ng/mL (ref <0.30)

## 2014-01-12 MED ORDER — FAMOTIDINE 20 MG PO TABS: 20.0000 mg | ORAL_TABLET | Freq: Two times a day (BID) | ORAL | Status: DC

## 2014-01-12 MED ORDER — PREDNISONE 20 MG PO TABS
40.0000 mg | ORAL_TABLET | Freq: Once | ORAL | Status: AC
  Administered 2014-01-12: 40 mg via ORAL
  Filled 2014-01-12: qty 2

## 2014-01-12 MED ORDER — PREDNISONE 20 MG PO TABS: 40.0000 mg | ORAL_TABLET | Freq: Every day | ORAL | Status: DC

## 2014-01-12 MED ORDER — DIPHENHYDRAMINE HCL 25 MG PO CAPS
25.0000 mg | ORAL_CAPSULE | Freq: Once | ORAL | Status: AC
  Administered 2014-01-12: 25 mg via ORAL
  Filled 2014-01-12: qty 1

## 2014-01-12 NOTE — Discharge Instructions (Signed)
Your testing has been normal - you should follow up with your doctor in the morning but if your chest pain worsens, return to the ER immediately - prednisone once daily for 5 days, pepcid daily and benadryl as needed.   You have been diagnosed with an allergic reaction. Usually allergic reactions like this are caused by exposures to something that you either ate or touched or smelled.  It may be related to a number of different exposures including a new perfume, topical creams, soaps, detergents, linens, clothing, medications. Occasionally we do not find an answer for why there is an allergic reaction. These are treated the same way including Benadryl as needed for itching and rash. (This can be used up to 50 mg every 6 hours as needed).  Pepcid 20 mg every night and prednisone once a day for 5 days. Please do not take the Benadryl and drive or take care of children or other imported duties as the Benadryl can make you sleepy.    If you should develop severe or worsening symptoms including difficulty breathing, difficulty swallowing, wheezing or increased coughing or a rash that developed on the inside of your mouth or a worsening rash on your skin, return to the hospital immediately for a recheck. Please call your Dr. in the morning for a recheck in 2 days if you are still having symptoms. If you do not have a Dr. see the list below.  If we have identified the source of your allergic reaction, please avoid this at all costs. This means stopping the medication if it is a new medication or a voiding topical exposures such as creams lotions body soaps or deodorants if this is the source.  Allergic Reaction, Mild to Moderate Allergies may happen from anything your body is sensitive to. This may be food, medications, pollens, chemicals, and nearly anything around you in everyday life that produces allergens. An allergen is anything that causes an allergy producing substance. Allergens cause your body to release  allergic antibodies. Through a chain of events, they cause a release of histamine into the blood stream. Histamines are meant to protect you, but they also cause your discomfort. This is why antihistamines are often used for allergies. Heredity is often a factor in causing allergic reactions. This means you may have some of the same allergies as your parents. Allergies happen in all age groups. You may have some idea of what caused your reaction. There are many allergens around [place]. It may be difficult to know what caused your reaction. If this is a first time event, it may never happen again. Allergies cannot be cured but can be controlled with medications. SYMPTOMS  You may get some or all of the following problems from allergies.  Swelling and itching in and around the mouth.   Tearing, itchy eyes.   Nasal congestion and runny nose.   Sneezing and coughing.   An itchy red rash or hives.   Vomiting or diarrhea.   Difficulty breathing.  Seasonal allergies occur in all age groups. They are seasonal because they usually occur during the same season every year. They may be a reaction to molds, grass pollens, or tree pollens. Other causes of allergies are house dust mite allergens, pet dander and mold spores. These are just a common few of the thousands of allergens around [place]. All of the symptoms listed above happen when you come in contact with pollens and other allergens. Seasonal allergies are usually not life threatening. They are  generally more of a nuisance that can often be handled using medications. Hay fever is a combination of all or some of the above listed allergy problems. It may often be treated with simple over-the-counter medications such as diphenhydramine. Take medication as directed. Check with your caregiver or package insert for child dosages. TREATMENT AND HOME CARE INSTRUCTIONS If hives or rash are present:  Take medications as directed.   You may use an over-the-counter  antihistamine (diphenhydramine) for hives and itching as needed. Do not drive or drink alcohol until medications used to treat the reaction have worn off. Antihistamines tend to make people sleepy.   Apply cold cloths (compresses) to the skin or take baths in cool water. This will help itching. Avoid hot baths or showers. Heat will make a rash and itching worse.   If your allergies persist and become more severe, and over the counter medications are not effective, there are many new medications your caretaker can prescribe. Immunotherapy or desensitizing injections can be used if all else fails. Follow up with your caregiver if problems continue.  SEEK MEDICAL CARE IF:   Your allergies are becoming progressively more troublesome.   You suspect a food allergy. Symptoms generally happen within 30 minutes of eating a food.   Your symptoms have not gone away within 2 days or are getting worse.   You develop new symptoms.   You want to retest yourself or your child with a food or drink you think causes an allergic reaction. Never test yourself or your child of a suspected allergy without being under the watchful eye of your caregivers. A second exposure to an allergen may be life-threatening.  SEEK IMMEDIATE MEDICAL CARE IF:  You develop difficulty breathing or wheezing, or have a tight feeling in your chest or throat.   You develop a swollen mouth, hives, swelling, or itching all over your body.  A severe reaction with any of the above problems should be considered life-threatening. If you suddenly develop difficulty breathing call for local emergency medical help. THIS IS AN EMERGENCY. MAKE SURE YOU:   Understand these instructions.   Will watch your condition.   Will get help right away if you are not doing well or get worse.  Document Released: 09/29/2007 Document Revised: 11/21/2011 Document Reviewed: 09/29/2007 [facility] Patient Information 2012 [place].  RESOURCE  GUIDE  Chronic Pain Problems: Contact [facility]  [phone] Patients need to be referred by their primary care doctor.  Insufficient Money for Medicine: Contact [employer]:  call "211" or [place] [phone].  No Primary Care Doctor: - Call [facility]  [phone] - can help you locate a primary care doctor that  accepts your insurance, provides certain services, etc. - Physician Referral [phone]  Agencies that provide inexpensive medical care: - [facility]  [facility]  [phone] - [facility]  [phone] - [facility]  [phone] - Planned Parenthood  [facility]  [phone]  [place] Providers: - [facility]- [address]  [phone], Mon-Fri 9am-7pm, Sat 9am-1pm - [facility]- [place], [place], [address] [place]  [place]- [address]  [place], [address], [phone]  Only accepts [place] patients after they have  their name  applied to their card  Self Pay (no insurance) in [facility]: - Sickle Cell Patients: Dr Kevan Ny, [facility]  [place], [facility]- [place]  [facility] [place]- [address], [facility]- see information above (Speak to [employer] if you do not have insurance)       -  [facility]- [place], [place],  [place], [place]  Dr Vista Lawman-  [address], [place], [facility]- [address]  [phone]       -  [facility] [place]- [address], [place], also [address] [phone]       -    [facility]- [address], [place], 1st & 3rd Saturday   every month, 10am-1pm  1) Find a Doctor and Pay Out of Pocket Although you won't have to find out who is covered by your insurance plan, it is a good idea to ask around and get recommendations. You will then need to call the office and see if the doctor you have chosen will accept you as a new patient and what types of options they offer for patients who are self-pay. Some doctors offer discounts or will set up payment plans for their patients who do not have insurance, but you will need to ask so you aren't surprised when you get to your appointment.  2) Contact Your Local Health Department Not all health departments have doctors that can see patients for sick visits, but many do, so it is worth a call to see if yours does. If you don't know where your local health department is, you can check in your phone book. The CDC also has a tool to help you locate your state's health department, and many state websites also have listings of all of their local health departments.  3) Find a [facility] If your illness is not likely to be very severe or complicated, you may want to try a walk in clinic. These are popping up all over the country in pharmacies, drugstores, and shopping centers. They're usually staffed by nurse practitioners or physician assistants that have been trained to treat common illnesses and complaints. They're usually fairly quick and inexpensive. However, if you have serious medical issues or chronic medical problems, these are probably not your best option  STD Testing - [place], [facility], [address], [place], phone [phone] or [phone].  Monday - Friday, call for an appointment. - [place], [facility], [address], [place], phone [phone] or [phone].  Monday - Friday, call for an appointment.  Abuse/Neglect: - [place] [phone] - [place] [phone] (After Hours)  Emergency Shelter:  [facility] [phone]  Maternity Homes: - [facility] [place] [phone] - [place] [phone]  MRSA Hotline #:   845-873-2021  [facility] [place] Dept. [address]         [place] Phone:  [phone]                                  Phone:  [phone]                   Phone:  [phone]  [place], [place] in [place], [address],                                  [place] [phone] or [phone] (After Hours)   [place]  Substance Abuse Resources: - Alcohol and Drug Services  [phone] - [place] [phone] - [facility] [place] [phone] - Residential & Outpatient Substance Abuse Program  [phone]  Psychological Services: - [place]  [place], [phone] [place]. [address], [place], [place]: [phone] or [phone], [employer]  Dental Assistance  If unable to pay or uninsured, contact:  [facility] or [facility]. to become qualified for the adult dental clinic.  Patients with Medicaid:  [facility] [phone] [address], [place] [address] [phone]  If unable to pay, or uninsured, contact [facility] [phone]) or [place] [phone] in [place], [place] in [facility]) to become qualified for the adult dental clinic  Other [place]- [place], [place], [place], [address], [place], [place], 2nd and 4th Thursday of the month at 6:30am.  10 clients each day by appointment, can sometimes see walk-in patients if someone does not show for an appointment. [facility]- [address] [place], [place], [address], [place], [place], [place], [address], [place] Department- [place] Department- [place] Department- [phone]

## 2014-01-12 NOTE — ED Notes (Signed)
Discharge instructions given and reviewed with patient.  Prescriptions given for Pepcid and Prednisone; effects and use explained.  Patient verbalized understanding to take medications as directed and to follow up with Dr. Sherrie Sport as needed for further treatment.  Patient ambulatory; discharged home in good condition.

## 2014-01-12 NOTE — ED Provider Notes (Signed)
CSN: [id number]     Arrival date & time 01/11/14  2340 History   First MD Initiated Contact with Patient 01/12/14 0016     Chief Complaint  Patient presents with  . Allergic Reaction   (Consider location/radiation/quality/duration/timing/severity/associated sxs/prior Treatment) HPI Comments: 53 y/o female with hx of htn, GERD, presents with c/o acute onset of CP and feeling of her throat closing off / sore throat that started 20 minutes after taking Mucinex.  This has been persistent though it has improved somwhat - she notes having some URI sx today including some congestion and sore throat and cough.  She has no hx of heart diease, she is very active and was able to walk on the treadmill today at the gym without dificulty or exertional sx.  She has no hx of allergy to mucinex in the past - no meds pta.  Patient is a 53 y.o. female presenting with allergic reaction. The history is provided by the patient.  Allergic Reaction   Past Medical History  Diagnosis Date  . HTN (hypertension)   . GERD (gastroesophageal reflux disease)   . Obesity   . Anxiety   . Depression   . IBS (irritable bowel syndrome)   . Migraine   . Chronic back pain   . Neck pain, chronic   . Diverticulitis     complicated by abscess requiring total colectomy and take-down of colostomy in 2004  . Asthma   . Terminal ileitis of small intestine     referred to [facility], Crohn's ruled out  . [place] spotted fever    Past Surgical History  Procedure Laterality Date  . Subtotal colectomy  2004    diverticulitis with abscess  . Hysterectomy      partial  . Esophagogastroduodenoscopy  02/2009    mild erosive reflux esophagitis  . Colonoscopy  03/2009    anastomotic ulcers and neoterminal ileum ulcers, anastomosis with SB at 30cm  . Esophagogastroduodenoscopy  07/2010    probable cervical esophageal web s/p dilation  . Umbilical hernia repair    . Cholecystectomy  2004  . Liver biopsy  2004    steatosis  .  Appendectomy    . Abdominal hysterectomy     Family History  Problem Relation Age of Onset  . ALS Father   . Crohn's disease Daughter   . Diabetes Mother   . Rheum arthritis Mother    History  Substance Use Topics  . Smoking status: Current Some Day Smoker  . Smokeless tobacco: Never Used  . Alcohol Use: Yes   OB History   Grav Para Term Preterm Abortions TAB SAB Ect Mult Living                 Review of Systems  All other systems reviewed and are negative.    Allergies  Hydromorphone hcl  Home Medications   Current Outpatient Rx  Name  Route  Sig  Dispense  Refill  . albuterol-ipratropium (COMBIVENT) 18-103 MCG/ACT inhaler   Inhalation   Inhale 2 puffs into the lungs every 6 (six) hours as needed. For shortness of breath         . ALPRAZolam (XANAX) 1 MG tablet   Oral   Take 1 mg by mouth 3 (three) times daily.          . Calcium-Vitamin D (CALTRATE 600 PLUS-VIT D PO)   Oral   Take 1 tablet by mouth daily. Alternating each week with Womens One-A-Day          .  dicyclomine (BENTYL) 10 MG capsule   Oral   Take 10 mg by mouth 4 (four) times daily -  before meals and at bedtime.           [place] esomeprazole (NEXIUM) 40 MG capsule   Oral   Take 40 mg by mouth 2 (two) times daily.           [place] gabapentin (NEURONTIN) 300 MG capsule   Oral   Take 300 mg by mouth 2 (two) times daily.           [place] HYDROcodone-acetaminophen (NORCO) 10-325 MG per tablet   Oral   Take 1 tablet by mouth 2 (two) times daily as needed. For pain         . imipramine (TOFRANIL) 10 MG tablet   Oral   Take 40 mg by mouth at bedtime.           Mar[place] Kitchen levothyroxine (SYNTHROID, LEVOTHROID) 112 MCG tablet   Oral   Take 112 mcg by mouth daily.           . Multiple Vitamins-Calcium (ONE-A-DAY WOMENS FORMULA PO)   Oral   Take 1 tablet by mouth daily. Alternating each week with Caltrate w/D          . QUEtiapine (SEROQUEL) 50 MG tablet   Oral   Take 50 mg by mouth at bedtime.            [place] Kitchen spironolactone (ALDACTONE) 50 MG tablet   Oral   Take 50 mg by mouth 2 (two) times daily.          . SUMAtriptan (IMITREX) 100 MG tablet   Oral   Take 100 mg by mouth every 2 (two) hours as needed. For migraines         . famotidine (PEPCID) 20 MG tablet   Oral   Take 1 tablet (20 mg total) by mouth 2 (two) times daily.   30 tablet   0   . fexofenadine (ALLEGRA) 180 MG tablet   Oral   Take 180 mg by mouth daily.           . potassium chloride (KLOR-CON) 10 MEQ CR tablet   Oral   Take 10 mEq by mouth daily.          . predniSONE (DELTASONE) 20 MG tablet   Oral   Take 2 tablets (40 mg total) by mouth daily.   10 tablet   0    BP 99/76  Pulse 81  Temp(Src) 99.1 F (37.3 C) (Oral)  Resp 13  Ht 5\' 3"  (1.6 m)  Wt 185 lb (83.915 kg)  BMI 32.78 kg/m2  SpO2 100% Physical Exam  Nursing note and vitals reviewed. Constitutional: She appears well-developed and well-nourished. No distress.  HENT:  Head: Normocephalic and atraumatic.  Mouth/Throat: Oropharynx is clear and moist. No oropharyngeal exudate.  Eyes: Conjunctivae and EOM are normal. Pupils are equal, round, and reactive to light. Right eye exhibits no discharge. Left eye exhibits no discharge. No scleral icterus.  Neck: Normal range of motion. Neck supple. No JVD present. No thyromegaly present.  Cardiovascular: Normal rate, regular rhythm, normal heart sounds and intact distal pulses.  Exam reveals no gallop and no friction rub.   No murmur heard. Pulmonary/Chest: Effort normal and breath sounds normal. No respiratory distress. She has no wheezes. She has no rales.  Abdominal: Soft. Bowel sounds are normal. She exhibits no distension and no mass. There is no tenderness.  Musculoskeletal: Normal  range of motion. She exhibits no edema and no tenderness.  Lymphadenopathy:    She has no cervical adenopathy.  Neurological: She is alert. Coordination normal.  Skin: Skin is warm and dry. No rash  noted. No erythema.  Psychiatric: She has a normal mood and affect. Her behavior is normal.    ED Course  Procedures (including critical care time) Labs Review Labs Reviewed  BASIC METABOLIC PANEL - Abnormal; Notable for the following:    Glucose, Bld 142 (*)    GFR calc non Af Amer 56 (*)    GFR calc Af Amer 65 (*)    All other components within normal limits  CBC  TROPONIN I  TROPONIN I   Imaging Review Dg Chest Port 1 View  01/12/2014   CLINICAL DATA:  Chest pain  EXAM: PORTABLE CHEST - 1 VIEW  COMPARISON:  06/02/2011  FINDINGS: The heart size and mediastinal contours are within normal limits. Both lungs are clear. The visualized skeletal structures are unremarkable.  IMPRESSION: No active disease.   Electronically Signed   By: Daryll Brod M.D.   On: 01/12/2014 00:34    EKG Interpretation   None       MDM   1. Allergic reaction   2. Chest pain    The pt's exam is unremarkable - she has normal VS  Filed Vitals:   01/11/14 2359 01/12/14 0200 01/12/14 0236  BP: 118/85 106/79 99/76  Pulse: 85  81  Temp: 99.1 F (37.3 C)    TempSrc: Oral    Resp: 20 20 13   Height: 5\' 3"  (1.6 m)    Weight: 185 lb (83.915 kg)    SpO2: 98%  100%    She has a non ischemic ECG with a rBBB - unchanged from prior  Benadryl, labs to r/o cardiac source though doubt as she has no hx of CAD and has low HEART score.  ED ECG REPORT  I personally interpreted this EKG   Date: 01/12/2014   Rate: 88  Rhythm: normal sinus rhythm  QRS Axis: normal  Intervals: normal  ST/T Wave abnormalities: nonspecific T wave changes  Conduction Disutrbances:right bundle branch block  Narrative Interpretation:   Old EKG Reviewed: unchanged  0218 AM: Pt has ongoing mild sx though they are improving - she is amenable to second trop.  Second trop normal, pt informed to return for worsening sx.  Meds given in ED:  Medications  diphenhydrAMINE (BENADRYL) capsule 25 mg (25 mg Oral Given 01/12/14 0057)   predniSONE (DELTASONE) tablet 40 mg (40 mg Oral Given 01/12/14 0057)    New Prescriptions   FAMOTIDINE (PEPCID) 20 MG TABLET    Take 1 tablet (20 mg total) by mouth 2 (two) times daily.   PREDNISONE (DELTASONE) 20 MG TABLET    Take 2 tablets (40 mg total) by mouth daily.      Johnna Acosta, MD 01/12/14 [phone]

## 2014-01-26 ENCOUNTER — Emergency Department (HOSPITAL_COMMUNITY)
Admission: EM | Admit: 2014-01-26 | Discharge: 2014-01-26 | Disposition: A | Discharge status: Home / Self Care | Payer: Medicare Other | Attending: Emergency Medicine | Admitting: Emergency Medicine

## 2014-01-26 ENCOUNTER — Encounter (HOSPITAL_COMMUNITY): Payer: Self-pay | Admitting: Emergency Medicine

## 2014-01-26 DIAGNOSIS — G8929 Other chronic pain: Secondary | ICD-10-CM | POA: Insufficient documentation

## 2014-01-26 DIAGNOSIS — W540XXA Bitten by dog, initial encounter: Secondary | ICD-10-CM | POA: Insufficient documentation

## 2014-01-26 DIAGNOSIS — S31809A Unspecified open wound of unspecified buttock, initial encounter: Secondary | ICD-10-CM | POA: Insufficient documentation

## 2014-01-26 DIAGNOSIS — I10 Essential (primary) hypertension: Secondary | ICD-10-CM | POA: Insufficient documentation

## 2014-01-26 DIAGNOSIS — Z79899 Other long term (current) drug therapy: Secondary | ICD-10-CM | POA: Insufficient documentation

## 2014-01-26 DIAGNOSIS — E669 Obesity, unspecified: Secondary | ICD-10-CM | POA: Insufficient documentation

## 2014-01-26 DIAGNOSIS — Z8619 Personal history of other infectious and parasitic diseases: Secondary | ICD-10-CM | POA: Insufficient documentation

## 2014-01-26 DIAGNOSIS — Y93K1 Activity, walking an animal: Secondary | ICD-10-CM | POA: Insufficient documentation

## 2014-01-26 DIAGNOSIS — F17200 Nicotine dependence, unspecified, uncomplicated: Secondary | ICD-10-CM | POA: Insufficient documentation

## 2014-01-26 DIAGNOSIS — T148XXA Other injury of unspecified body region, initial encounter: Secondary | ICD-10-CM

## 2014-01-26 DIAGNOSIS — F329 Major depressive disorder, single episode, unspecified: Secondary | ICD-10-CM | POA: Insufficient documentation

## 2014-01-26 DIAGNOSIS — Z23 Encounter for immunization: Secondary | ICD-10-CM | POA: Insufficient documentation

## 2014-01-26 DIAGNOSIS — Y929 Unspecified place or not applicable: Secondary | ICD-10-CM | POA: Insufficient documentation

## 2014-01-26 DIAGNOSIS — K219 Gastro-esophageal reflux disease without esophagitis: Secondary | ICD-10-CM | POA: Insufficient documentation

## 2014-01-26 DIAGNOSIS — J45909 Unspecified asthma, uncomplicated: Secondary | ICD-10-CM | POA: Insufficient documentation

## 2014-01-26 DIAGNOSIS — F411 Generalized anxiety disorder: Secondary | ICD-10-CM | POA: Insufficient documentation

## 2014-01-26 DIAGNOSIS — K589 Irritable bowel syndrome without diarrhea: Secondary | ICD-10-CM | POA: Insufficient documentation

## 2014-01-26 DIAGNOSIS — Z792 Long term (current) use of antibiotics: Secondary | ICD-10-CM | POA: Insufficient documentation

## 2014-01-26 DIAGNOSIS — G43909 Migraine, unspecified, not intractable, without status migrainosus: Secondary | ICD-10-CM | POA: Insufficient documentation

## 2014-01-26 DIAGNOSIS — F3289 Other specified depressive episodes: Secondary | ICD-10-CM | POA: Insufficient documentation

## 2014-01-26 MED ORDER — HYDROCODONE-ACETAMINOPHEN 5-325 MG PO TABS: 1.0000 | ORAL_TABLET | ORAL | Status: DC | PRN

## 2014-01-26 MED ORDER — TETANUS-DIPHTH-ACELL PERTUSSIS 5-2.5-18.5 LF-MCG/0.5 IM SUSP
INTRAMUSCULAR | Status: AC
  Filled 2014-01-26: qty 0.5

## 2014-01-26 MED ORDER — TETANUS-DIPHTH-ACELL PERTUSSIS 5-2.5-18.5 LF-MCG/0.5 IM SUSP
0.5000 mL | Freq: Once | INTRAMUSCULAR | Status: AC
  Administered 2014-01-26: 0.5 mL via INTRAMUSCULAR

## 2014-01-26 NOTE — ED Notes (Signed)
Pt states she was walking her dog and was bit in the right buttocks by a pit. States police was already there and took the dog for 10 days. According to owner of dog, shots were up to date

## 2014-01-26 NOTE — Discharge Instructions (Signed)
Please soak in a double warm Epsom salt water daily until the wounds of the buttocks heal. Please finish your doxycycline. Please see your primary physician, or return to the emergency department if any problems or concerns. Animal Bite An animal bite can result in a scratch on the skin, deep open cut, puncture of the skin, crush injury, or tearing away of the skin or a body part. Dogs are responsible for most animal bites. Children are bitten more often than adults. An animal bite can range from very mild to more serious. A small bite from your house pet is no cause for alarm. However, some animal bites can become infected or injure a bone or other tissue. You must seek medical care if:  The skin is broken and bleeding does not slow down or stop after 15 minutes.  The puncture is deep and difficult to clean (such as a cat bite).  Pain, warmth, redness, or pus develops around the wound.  The bite is from a stray animal or rodent. There may be a risk of rabies infection.  The bite is from a snake, raccoon, skunk, fox, coyote, or bat. There may be a risk of rabies infection.  The person bitten has a chronic illness such as diabetes, liver disease, or cancer, or the person takes medicine that lowers the immune system.  There is concern about the location and severity of the bite. It is important to clean and protect an animal bite wound right away to prevent infection. Follow these steps:  Clean the wound with plenty of water and soap.  Apply an antibiotic cream.  Apply gentle pressure over the wound with a clean towel or gauze to slow or stop bleeding.  Elevate the affected area above the heart to help stop any bleeding.  Seek medical care. Getting medical care within 8 hours of the animal bite leads to the best possible outcome. DIAGNOSIS  Your caregiver will most likely:  Take a detailed history of the animal and the bite injury.  Perform a wound exam.  Take your medical  history. Blood tests or X-rays may be performed. Sometimes, infected bite wounds are cultured and sent to a lab to identify the infectious bacteria.  TREATMENT  Medical treatment will depend on the location and type of animal bite as well as the patient's medical history. Treatment may include:  Wound care, such as cleaning and flushing the wound with saline solution, bandaging, and elevating the affected area.  Antibiotics.  Tetanus immunization.  Rabies immunization.  Leaving the wound open to heal. This is often done with animal bites, due to the high risk of infection. However, in certain cases, wound closure with stitches, wound adhesive, skin adhesive strips, or staples may be used. Infected bites that are left untreated may require intravenous (IV) antibiotics and surgical treatment in the hospital. [place]  Follow your caregiver's instructions for wound care.  Take all medicines as directed.  If your caregiver prescribes antibiotics, take them as directed. Finish them even if you start to feel better.  Follow up with your caregiver for further exams or immunizations as directed. You may need a tetanus shot if:  You cannot remember when you had your last tetanus shot.  You have never had a tetanus shot.  The injury broke your skin. If you get a tetanus shot, your arm may swell, get red, and feel warm to the touch. This is common and not a problem. If you need a tetanus shot  and you choose not to have one, there is a rare chance of getting tetanus. Sickness from tetanus can be serious. SEEK MEDICAL CARE IF:  You notice warmth, redness, soreness, swelling, pus discharge, or a bad smell coming from the wound.  You have a red line on the skin coming from the wound.  You have a fever, chills, or a general ill feeling.  You have nausea or vomiting.  You have continued or worsening pain.  You have trouble moving the injured part.  You have other questions  or concerns. MAKE SURE YOU:  Understand these instructions.  Will watch your condition.  Will get help right away if you are not doing well or get worse. Document Released: 08/20/2011 Document Revised: 02/24/2012 Document Reviewed: 08/20/2011 [facility] Patient Information 2014 [place].

## 2014-01-26 NOTE — ED Provider Notes (Signed)
CSN: [id number]     Arrival date & time 01/26/14  38 History   First MD Initiated Contact with Patient 01/26/14 1416     Chief Complaint  Patient presents with  . Animal Bite     (Consider location/radiation/quality/duration/timing/severity/associated sxs/prior Treatment) Patient is a 53 y.o. female presenting with animal bite. The history is provided by the patient.  Animal Bite Contact animal:  Dog Animal bite location: right buttox. Time since incident:  3 hours Pain details:    Quality:  Aching   Severity:  Moderate   Timing:  Constant   Progression:  Unchanged Incident location:  Another residence Notifications:  Publishing rights manager rabies vaccination status:  Up to date Animal in possession: yes   Tetanus status:  Out of date Relieved by:  Nothing Ineffective treatments:  None tried Associated symptoms: no numbness     Past Medical History  Diagnosis Date  . HTN (hypertension)   . GERD (gastroesophageal reflux disease)   . Obesity   . Anxiety   . Depression   . IBS (irritable bowel syndrome)   . Migraine   . Chronic back pain   . Neck pain, chronic   . Diverticulitis     complicated by abscess requiring total colectomy and take-down of colostomy in 2004  . Asthma   . Terminal ileitis of small intestine     referred to [facility], Crohn's ruled out  . [place] spotted fever    Past Surgical History  Procedure Laterality Date  . Subtotal colectomy  2004    diverticulitis with abscess  . Hysterectomy      partial  . Esophagogastroduodenoscopy  02/2009    mild erosive reflux esophagitis  . Colonoscopy  03/2009    anastomotic ulcers and neoterminal ileum ulcers, anastomosis with SB at 30cm  . Esophagogastroduodenoscopy  07/2010    probable cervical esophageal web s/p dilation  . Umbilical hernia repair    . Cholecystectomy  2004  . Liver biopsy  2004    steatosis  . Appendectomy    . Abdominal hysterectomy     Family History  Problem  Relation Age of Onset  . ALS Father   . Crohn's disease Daughter   . Diabetes Mother   . Rheum arthritis Mother    History  Substance Use Topics  . Smoking status: Current Some Day Smoker  . Smokeless tobacco: Never Used  . Alcohol Use: Yes   OB History   Grav Para Term Preterm Abortions TAB SAB Ect Mult Living                 Review of Systems  Constitutional: Negative for activity change.       All ROS Neg except as noted in HPI  HENT: Negative for nosebleeds.   Eyes: Negative for photophobia and discharge.  Respiratory: Negative for cough, shortness of breath and wheezing.   Cardiovascular: Negative for chest pain and palpitations.  Gastrointestinal: Negative for abdominal pain and blood in stool.  Genitourinary: Negative for dysuria, frequency and hematuria.  Musculoskeletal: Negative for arthralgias, back pain and neck pain.  Skin: Negative.   Neurological: Negative for dizziness, seizures, speech difficulty and numbness.  Psychiatric/Behavioral: Negative for hallucinations and confusion.      Allergies  Hydromorphone hcl  Home Medications   Current Outpatient Rx  Name  Route  Sig  Dispense  Refill  . albuterol-ipratropium (COMBIVENT) 18-103 MCG/ACT inhaler   Inhalation   Inhale 2 puffs into the lungs every 6 (  six) hours as needed. For shortness of breath         . ALPRAZolam (XANAX) 1 MG tablet   Oral   Take 1 mg by mouth 3 (three) times daily.          . Calcium-Vitamin D (CALTRATE 600 PLUS-VIT D PO)   Oral   Take 1 tablet by mouth daily. Alternating each week with Womens One-A-Day          . dicyclomine (BENTYL) 10 MG capsule   Oral   Take 10 mg by mouth 4 (four) times daily -  before meals and at bedtime.           Marland Kitchen doxycycline (VIBRA-TABS) 100 MG tablet   Oral   Take 1 tablet by mouth 2 (two) times daily.         Marland Kitchen esomeprazole (NEXIUM) 40 MG capsule   Oral   Take 40 mg by mouth 2 (two) times daily.           . famotidine  (PEPCID) 20 MG tablet   Oral   Take 1 tablet (20 mg total) by mouth 2 (two) times daily.   30 tablet   0   . fexofenadine (ALLEGRA) 180 MG tablet   Oral   Take 180 mg by mouth daily.           Marland Kitchen gabapentin (NEURONTIN) 300 MG capsule   Oral   Take 300 mg by mouth 2 (two) times daily.           Marland Kitchen HYDROcodone-acetaminophen (NORCO) 10-325 MG per tablet   Oral   Take 1 tablet by mouth 2 (two) times daily as needed. For pain         . imipramine (TOFRANIL) 10 MG tablet   Oral   Take 40 mg by mouth at bedtime.           Marland Kitchen levothyroxine (SYNTHROID, LEVOTHROID) 112 MCG tablet   Oral   Take 112 mcg by mouth daily.           . Multiple Vitamins-Calcium (ONE-A-DAY WOMENS FORMULA PO)   Oral   Take 1 tablet by mouth daily. Alternating each week with Caltrate w/D          . potassium chloride (KLOR-CON) 10 MEQ CR tablet   Oral   Take 10 mEq by mouth daily.          . QUEtiapine (SEROQUEL) 50 MG tablet   Oral   Take 50 mg by mouth at bedtime.           Marland Kitchen spironolactone (ALDACTONE) 50 MG tablet   Oral   Take 50 mg by mouth 2 (two) times daily.          . SUMAtriptan (IMITREX) 100 MG tablet   Oral   Take 100 mg by mouth every 2 (two) hours as needed. For migraines         . HYDROcodone-acetaminophen (NORCO) 5-325 MG per tablet   Oral   Take 1 tablet by mouth every 4 (four) hours as needed for moderate pain.   12 tablet   0    BP 123/87  Pulse 93  Temp(Src) 98.5 F (36.9 C) (Oral)  Resp 18  Ht 5\' 3"  (1.6 m)  Wt 185 lb (83.915 kg)  BMI 32.78 kg/m2  SpO2 100% Physical Exam  Nursing note and vitals reviewed. Constitutional: She is oriented to person, place, and time. She appears well-developed and well-nourished.  Non-toxic appearance.  HENT:  Head: Normocephalic.  Right Ear: Tympanic membrane and external ear normal.  Left Ear: Tympanic membrane and external ear normal.  Eyes: EOM and lids are normal. Pupils are equal, round, and reactive to light.    Neck: Normal range of motion. Neck supple. Carotid bruit is not present.  Cardiovascular: Normal rate, regular rhythm, normal heart sounds, intact distal pulses and normal pulses.   Pulmonary/Chest: Breath sounds normal. No respiratory distress.  Abdominal: Soft. Bowel sounds are normal. There is no tenderness. There is no guarding.  Genitourinary:     Musculoskeletal: Normal range of motion.  Lymphadenopathy:       Head (right side): No submandibular adenopathy present.       Head (left side): No submandibular adenopathy present.    She has no cervical adenopathy.  Neurological: She is alert and oriented to person, place, and time. She has normal strength. No cranial nerve deficit or sensory deficit.  Skin: Skin is warm and dry.  Psychiatric: She has a normal mood and affect. Her speech is normal.    ED Course  Procedures (including critical care time) Labs Review Labs Reviewed - No data to display Imaging Review No results found.  EKG Interpretation   None       MDM   Final diagnoses:  Animal bite    **I have reviewed nursing notes, vital signs, and all appropriate lab and imaging results for this patient.*  Patient sustained a bite on the right buttocks from a neighbors dog while she was walking her dog. The police have been notified, and are in position to observe the dog for the next 10 days. The patient states she is not sure of her last tetanus. Tetanus was given today. The owner of the dog states that the dog is up-to-date on immunizations. The patient is finishing a course of doxycycline for a bronchitis related problem. Patient advised to see primary physician or return to the emergency department if any signs of infection.  Lenox Ahr, PA-C 01/26/14 1528

## 2014-01-26 NOTE — ED Provider Notes (Signed)
Medical screening examination/treatment/procedure(s) were conducted as a shared visit with non-physician practitioner(s) and myself.  I personally evaluated the patient during the encounter.  EKG Interpretation   None      Patient with animal bite. Is on antibiotics. No current sign of infection. Please have been notified. Tetanus given. Dog is Up-to-date on immunizations. Discharge home  Jasper Riling. Alvino Chapel, MD 01/26/14 2342

## 2015-12-17 ENCOUNTER — Encounter (HOSPITAL_COMMUNITY): Payer: Self-pay | Admitting: *Deleted

## 2015-12-17 ENCOUNTER — Emergency Department (HOSPITAL_COMMUNITY)
Admission: EM | Admit: 2015-12-17 | Discharge: 2015-12-17 | Disposition: A | Discharge status: Home / Self Care | Payer: Medicare HMO | Attending: Emergency Medicine | Admitting: Emergency Medicine

## 2015-12-17 ENCOUNTER — Emergency Department (HOSPITAL_COMMUNITY): Payer: Medicare HMO

## 2015-12-17 DIAGNOSIS — Z862 Personal history of diseases of the blood and blood-forming organs and certain disorders involving the immune mechanism: Secondary | ICD-10-CM | POA: Insufficient documentation

## 2015-12-17 DIAGNOSIS — K219 Gastro-esophageal reflux disease without esophagitis: Secondary | ICD-10-CM | POA: Diagnosis not present

## 2015-12-17 DIAGNOSIS — K529 Noninfective gastroenteritis and colitis, unspecified: Secondary | ICD-10-CM | POA: Insufficient documentation

## 2015-12-17 DIAGNOSIS — J45909 Unspecified asthma, uncomplicated: Secondary | ICD-10-CM | POA: Diagnosis not present

## 2015-12-17 DIAGNOSIS — Z79899 Other long term (current) drug therapy: Secondary | ICD-10-CM | POA: Insufficient documentation

## 2015-12-17 DIAGNOSIS — F329 Major depressive disorder, single episode, unspecified: Secondary | ICD-10-CM | POA: Insufficient documentation

## 2015-12-17 DIAGNOSIS — N281 Cyst of kidney, acquired: Secondary | ICD-10-CM | POA: Diagnosis not present

## 2015-12-17 DIAGNOSIS — R1084 Generalized abdominal pain: Secondary | ICD-10-CM | POA: Diagnosis present

## 2015-12-17 DIAGNOSIS — G43909 Migraine, unspecified, not intractable, without status migrainosus: Secondary | ICD-10-CM | POA: Insufficient documentation

## 2015-12-17 DIAGNOSIS — Z9049 Acquired absence of other specified parts of digestive tract: Secondary | ICD-10-CM | POA: Insufficient documentation

## 2015-12-17 DIAGNOSIS — I10 Essential (primary) hypertension: Secondary | ICD-10-CM | POA: Insufficient documentation

## 2015-12-17 DIAGNOSIS — F17200 Nicotine dependence, unspecified, uncomplicated: Secondary | ICD-10-CM | POA: Insufficient documentation

## 2015-12-17 DIAGNOSIS — Z90710 Acquired absence of both cervix and uterus: Secondary | ICD-10-CM | POA: Diagnosis not present

## 2015-12-17 DIAGNOSIS — Z9889 Other specified postprocedural states: Secondary | ICD-10-CM | POA: Diagnosis not present

## 2015-12-17 DIAGNOSIS — G8929 Other chronic pain: Secondary | ICD-10-CM | POA: Insufficient documentation

## 2015-12-17 DIAGNOSIS — E079 Disorder of thyroid, unspecified: Secondary | ICD-10-CM | POA: Insufficient documentation

## 2015-12-17 DIAGNOSIS — E669 Obesity, unspecified: Secondary | ICD-10-CM | POA: Diagnosis not present

## 2015-12-17 DIAGNOSIS — R1032 Left lower quadrant pain: Secondary | ICD-10-CM | POA: Diagnosis not present

## 2015-12-17 DIAGNOSIS — R1031 Right lower quadrant pain: Secondary | ICD-10-CM | POA: Diagnosis not present

## 2015-12-17 HISTORY — PX: HEMORRHOID BANDING: SHX5850

## 2015-12-17 HISTORY — DX: Disorder of thyroid, unspecified: E07.9

## 2015-12-17 LAB — URINE MICROSCOPIC-ADD ON

## 2015-12-17 LAB — COMPREHENSIVE METABOLIC PANEL
ALK PHOS: 52 U/L (ref 38–126)
ALT: 16 U/L (ref 14–54)
AST: 22 U/L (ref 15–41)
Albumin: 4.1 g/dL (ref 3.5–5.0)
Anion gap: 9 (ref 5–15)
BILIRUBIN TOTAL: 0.3 mg/dL (ref 0.3–1.2)
BUN: 13 mg/dL (ref 6–20)
CO2: 24 mmol/L (ref 22–32)
CREATININE: 1.19 mg/dL — AB (ref 0.44–1.00)
Calcium: 9.5 mg/dL (ref 8.9–10.3)
Chloride: 108 mmol/L (ref 101–111)
GFR, EST AFRICAN AMERICAN: 58 mL/min — AB (ref >60)
GFR, EST NON AFRICAN AMERICAN: 50 mL/min — AB (ref >60)
Glucose, Bld: 105 mg/dL — ABNORMAL HIGH (ref 65–99)
Potassium: 4.2 mmol/L (ref 3.5–5.1)
Sodium: 141 mmol/L (ref 135–145)
TOTAL PROTEIN: 7.4 g/dL (ref 6.5–8.1)

## 2015-12-17 LAB — LIPASE, BLOOD: LIPASE: 42 U/L (ref 11–51)

## 2015-12-17 LAB — CBC
HEMATOCRIT: 40.7 % (ref 36.0–46.0)
Hemoglobin: 14 g/dL (ref 12.0–15.0)
MCH: 31.4 pg (ref 26.0–34.0)
MCHC: 34.4 g/dL (ref 30.0–36.0)
MCV: 91.3 fL (ref 78.0–100.0)
Platelets: 238 10*3/uL (ref 150–400)
RBC: 4.46 MIL/uL (ref 3.87–5.11)
RDW: 13.9 % (ref 11.5–15.5)
WBC: 9.9 10*3/uL (ref 4.0–10.5)

## 2015-12-17 LAB — URINALYSIS, ROUTINE W REFLEX MICROSCOPIC
Bilirubin Urine: NEGATIVE
Glucose, UA: NEGATIVE mg/dL
Ketones, ur: NEGATIVE mg/dL
LEUKOCYTES UA: NEGATIVE
NITRITE: NEGATIVE
PH: 5.5 (ref 5.0–8.0)
PROTEIN: NEGATIVE mg/dL
Specific Gravity, Urine: 1.025 (ref 1.005–1.030)

## 2015-12-17 MED ORDER — OXYCODONE-ACETAMINOPHEN 5-325 MG PO TABS
1.0000 | ORAL_TABLET | Freq: Once | ORAL | Status: AC
  Administered 2015-12-17: 1 via ORAL
  Filled 2015-12-17: qty 1

## 2015-12-17 MED ORDER — AMOXICILLIN-POT CLAVULANATE 875-125 MG PO TABS
1.0000 | ORAL_TABLET | Freq: Once | ORAL | Status: AC
  Administered 2015-12-17: 1 via ORAL
  Filled 2015-12-17: qty 1

## 2015-12-17 MED ORDER — FENTANYL CITRATE (PF) 100 MCG/2ML IJ SOLN: 50.0000 ug | Freq: Once | INTRAMUSCULAR | Status: AC

## 2015-12-17 MED ORDER — FENTANYL CITRATE (PF) 100 MCG/2ML IJ SOLN
INTRAMUSCULAR | Status: AC
  Filled 2015-12-17: qty 2

## 2015-12-17 MED ORDER — ONDANSETRON HCL 4 MG/2ML IJ SOLN
4.0000 mg | Freq: Once | INTRAMUSCULAR | Status: AC
  Administered 2015-12-17: 4 mg via INTRAVENOUS
  Filled 2015-12-17: qty 2

## 2015-12-17 MED ORDER — FENTANYL CITRATE (PF) 100 MCG/2ML IJ SOLN
50.0000 ug | Freq: Once | INTRAMUSCULAR | Status: AC
  Administered 2015-12-17: 50 ug via INTRAVENOUS
  Filled 2015-12-17: qty 2

## 2015-12-17 MED ORDER — ONDANSETRON 8 MG PO TBDP
8.0000 mg | ORAL_TABLET | Freq: Once | ORAL | Status: AC
  Administered 2015-12-17: 8 mg via ORAL
  Filled 2015-12-17: qty 1

## 2015-12-17 MED ORDER — FLUCONAZOLE 150 MG PO TABS: 150.0000 mg | ORAL_TABLET | Freq: Every day | ORAL | Status: DC

## 2015-12-17 MED ORDER — IOHEXOL 300 MG/ML  SOLN
100.0000 mL | Freq: Once | INTRAMUSCULAR | Status: AC | PRN
  Administered 2015-12-17: 100 mL via INTRAVENOUS

## 2015-12-17 MED ORDER — AMOXICILLIN-POT CLAVULANATE 875-125 MG PO TABS: 1.0000 | ORAL_TABLET | Freq: Two times a day (BID) | ORAL | Status: DC

## 2015-12-17 MED ORDER — OXYCODONE-ACETAMINOPHEN 5-325 MG PO TABS: 1.0000 | ORAL_TABLET | ORAL | Status: DC | PRN

## 2015-12-17 MED ORDER — ACETAMINOPHEN 325 MG PO TABS
650.0000 mg | ORAL_TABLET | Freq: Once | ORAL | Status: AC
  Administered 2015-12-17: 650 mg via ORAL
  Filled 2015-12-17: qty 2

## 2015-12-17 MED ORDER — FENTANYL CITRATE (PF) 100 MCG/2ML IJ SOLN
50.0000 ug | Freq: Once | INTRAMUSCULAR | Status: AC
  Administered 2015-12-17: 50 ug via INTRAVENOUS

## 2015-12-17 NOTE — ED Provider Notes (Signed)
CSN: [id number]     Arrival date & time 12/17/15  0224 History   First MD Initiated Contact with Patient 12/17/15 0251     Chief Complaint  Patient presents with  . Abdominal Pain     Patient is a 55 y.o. female presenting with cramps. The history is provided by the patient.  Abdominal Cramping This is a new problem. The current episode started 2 days ago. The problem occurs daily. The problem has not changed since onset.Associated symptoms include abdominal pain. Pertinent negatives include no chest pain and no shortness of breath. Nothing aggravates the symptoms. Nothing relieves the symptoms.  pt reports diffuse abd cramping for 2 days It is upper and lower abdomen No fever/vomiting/diarrhea She reports today after a BM she wiped and thought she "saw a suture" She felt something in her rectum that she decided to pull and this worsened the pain No blood in stool No other complaints  Past Medical History  Diagnosis Date  . HTN (hypertension)   . GERD (gastroesophageal reflux disease)   . Obesity   . Anxiety   . Depression   . IBS (irritable bowel syndrome)   . Migraine   . Chronic back pain   . Neck pain, chronic   . Diverticulitis     complicated by abscess requiring total colectomy and take-down of colostomy in 2004  . Asthma   . Terminal ileitis of small intestine ([place])     referred to [facility], Crohn's ruled out  . [facility] spotted fever   . Thyroid disease    Past Surgical History  Procedure Laterality Date  . Subtotal colectomy  2004    diverticulitis with abscess  . Hysterectomy      partial  . Esophagogastroduodenoscopy  02/2009    mild erosive reflux esophagitis  . Colonoscopy  03/2009    anastomotic ulcers and neoterminal ileum ulcers, anastomosis with SB at 30cm  . Esophagogastroduodenoscopy  07/2010    probable cervical esophageal web s/p dilation  . Umbilical hernia repair    . Cholecystectomy  2004  . Liver biopsy  2004    steatosis  .  Appendectomy    . Abdominal hysterectomy     Family History  Problem Relation Age of Onset  . ALS Father   . Crohn's disease Daughter   . Diabetes Mother   . Rheum arthritis Mother    Social History  Substance Use Topics  . Smoking status: Current Some Day Smoker  . Smokeless tobacco: Never Used  . Alcohol Use: Yes   OB History    No data available     Review of Systems  Constitutional: Negative for fever.  Respiratory: Negative for shortness of breath.   Cardiovascular: Negative for chest pain.  Gastrointestinal: Positive for abdominal pain. Negative for blood in stool.  Genitourinary: Negative for dysuria.  All other systems reviewed and are negative.     Allergies  Hydromorphone hcl  Home Medications   Prior to Admission medications   Medication Sig Start Date End Date Taking? Authorizing Provider  Calcium-Vitamin D (CALTRATE 600 PLUS-VIT D PO) Take 1 tablet by mouth daily. Alternating each week with Womens One-A-Day    Yes Historical Provider, MD  dicyclomine (BENTYL) 10 MG capsule Take 10 mg by mouth 4 (four) times daily -  before meals and at bedtime.     Yes Historical Provider, MD  famotidine (PEPCID) 20 MG tablet Take 1 tablet (20 mg total) by mouth 2 (two) times daily. 01/12/14  Yes Noemi Chapel, MD  gabapentin (NEURONTIN) 300 MG capsule Take 300 mg by mouth 2 (two) times daily.     Yes Historical Provider, MD  imipramine (TOFRANIL) 10 MG tablet Take 40 mg by mouth at bedtime.     Yes Historical Provider, MD  levothyroxine (SYNTHROID, LEVOTHROID) 112 MCG tablet Take 112 mcg by mouth daily.     Yes Historical Provider, MD  Multiple Vitamins-Calcium (ONE-A-DAY WOMENS FORMULA PO) Take 1 tablet by mouth daily. Alternating each week with Caltrate w/D    Yes Historical Provider, MD  QUEtiapine (SEROQUEL) 50 MG tablet Take 50 mg by mouth at bedtime.     Yes Historical Provider, MD  spironolactone (ALDACTONE) 50 MG tablet Take 50 mg by mouth 2 (two) times daily.    Yes  Historical Provider, MD  albuterol-ipratropium (COMBIVENT) 18-103 MCG/ACT inhaler Inhale 2 puffs into the lungs every 6 (six) hours as needed. For shortness of breath    Historical Provider, MD  ALPRAZolam Duanne Moron) 1 MG tablet Take 1 mg by mouth 3 (three) times daily.     Historical Provider, MD  esomeprazole (NEXIUM) 40 MG capsule Take 40 mg by mouth 2 (two) times daily.      Historical Provider, MD  fexofenadine (ALLEGRA) 180 MG tablet Take 180 mg by mouth daily.      Historical Provider, MD  potassium chloride (KLOR-CON) 10 MEQ CR tablet Take 10 mEq by mouth daily.     Historical Provider, MD  SUMAtriptan (IMITREX) 100 MG tablet Take 100 mg by mouth every 2 (two) hours as needed. For migraines    Historical Provider, MD   BP 118/82 mmHg  Pulse 88  Temp(Src) 98.2 F (36.8 C) (Oral)  Resp 18  Ht 5\' 3"  (1.6 m)  Wt 85.73 kg  BMI 33.49 kg/m2  SpO2 100% Physical Exam CONSTITUTIONAL: Well developed/well nourished HEAD: Normocephalic/atraumatic EYES: EOMI/PERRL ENMT: Mucous membranes moist NECK: supple no meningeal signs SPINE/BACK:entire spine nontender CV: S1/S2 noted, no murmurs/rubs/gallops noted LUNGS: Lungs are clear to auscultation bilaterally, no apparent distress ABDOMEN: soft, nontender, no rebound or guarding, bowel sounds noted throughout abdomen.  Well healed scars noted to abdomen Rectal - stool color brown.  No foreign bodies noted.  She has an external hemorrhoid and also internal hemorrhoid.  No thrombosis.  No blood noted.  Nurse Shirlean Mylar present for exam GU:no cva tenderness NEURO: Pt is awake/alert/appropriate, moves all extremitiesx4.  No facial droop.   EXTREMITIES: pulses normal/equal, full ROM SKIN: warm, color normal PSYCH: no abnormalities of mood noted, alert and oriented to situation  ED Course  Procedures  5:20 AM Initially pt was comfortable with no tenderness On repeat exam she has significant lower abdominal tenderness CT imaging ordered 6:58 AM Pt  still with low abd tenderness but no other focal tenderness, no guarding CT imaging reveals wall thickening probable early colitis Will start her on oral antibiotics She also requests meds for yeast infection Pt is otherwise stable for d/c home She is nontoxic in appearance We discussed strict return precautions  Labs Review Labs Reviewed  COMPREHENSIVE METABOLIC PANEL - Abnormal; Notable for the following:    Glucose, Bld 105 (*)    Creatinine, Ser 1.19 (*)    GFR calc non Af Amer 50 (*)    GFR calc Af Amer 58 (*)    All other components within normal limits  URINALYSIS, ROUTINE W REFLEX MICROSCOPIC (NOT AT [facility]) - Abnormal; Notable for the following:    Hgb urine dipstick SMALL (*)  All other components within normal limits  URINE MICROSCOPIC-ADD ON - Abnormal; Notable for the following:    Squamous Epithelial / LPF 6-30 (*)    Bacteria, UA FEW (*)    All other components within normal limits  LIPASE, BLOOD  CBC    Imaging Review Ct Abdomen Pelvis W Contrast  12/17/2015  CLINICAL DATA:  Acute onset of bilateral lower quadrant abdominal pain. Initial encounter. EXAM: CT ABDOMEN AND PELVIS WITH CONTRAST TECHNIQUE: Multidetector CT imaging of the abdomen and pelvis was performed using the standard protocol following bolus administration of intravenous contrast. CONTRAST:  140mL OMNIPAQUE IOHEXOL 300 MG/ML  SOLN COMPARISON:  None. FINDINGS: The visualized lung bases are clear. The liver and spleen are unremarkable in appearance. The patient is status post cholecystectomy, with clips noted at the gallbladder fossa. The pancreas and adrenal glands are unremarkable. A 6 mm cyst is noted in the periphery of the right kidney. The kidneys are otherwise unremarkable. There is no evidence of hydronephrosis. No renal or ureteral stones are seen. No perinephric stranding is appreciated. No free fluid is identified. The small bowel is unremarkable in appearance. The stomach is within normal limits.  No acute vascular abnormalities are seen. The patient is status post resection of much of the colon. Associated anastomoses are grossly unremarkable. The remaining sigmoid colon demonstrates mild wall thickening, but this is thought reflect intraluminal contents. The bladder is mildly distended and grossly unremarkable. The patient is status post hysterectomy. No suspicious adnexal masses are seen. No inguinal lymphadenopathy is seen. No acute osseous abnormalities are identified. IMPRESSION: 1. No acute abnormality seen within the abdomen or pelvis. 2. Mild apparent wall thickening at the sigmoid colon is thought to reflect intraluminal contents, though depending on the degree of clinical concern, colonoscopy could be considered for further evaluation. 3. Small right renal cyst noted. Electronically Signed   By: Garald Balding M.D.   On: 12/17/2015 06:35   I have personally reviewed and evaluated these  lab results as part of my medical decision-making.  Medications  acetaminophen (TYLENOL) tablet 650 mg (650 mg Oral Given 12/17/15 0327)  ondansetron (ZOFRAN-ODT) disintegrating tablet 8 mg (8 mg Oral Given 12/17/15 0327)  oxyCODONE-acetaminophen (PERCOCET/ROXICET) 5-325 MG per tablet 1 tablet (1 tablet Oral Given 12/17/15 0438)  fentaNYL (SUBLIMAZE) injection 50 mcg ( Intravenous Duplicate [id number])  ondansetron (ZOFRAN) injection 4 mg (4 mg Intravenous Given 12/17/15 0524)  iohexol (OMNIPAQUE) 300 MG/ML solution 100 mL (100 mLs Intravenous Contrast Given 12/17/15 0550)  fentaNYL (SUBLIMAZE) injection 50 mcg (50 mcg Intravenous Given 12/17/15 0628)  fentaNYL (SUBLIMAZE) injection 50 mcg (0 mcg Intravenous Duplicate [id number])  amoxicillin-clavulanate (AUGMENTIN) 875-125 MG per tablet 1 tablet (1 tablet Oral Given 12/17/15 0646)     MDM   Final diagnoses:  Colitis    Nursing notes including past medical history and social history reviewed and considered in documentation Labs/vital reviewed myself and  considered during evaluation     Ripley Fraise, MD 12/17/15 0700

## 2015-12-17 NOTE — ED Notes (Signed)
Pt c/o pain starting to com back or increase. Updated EDP and administered additiona 27mcg fentanyl per order. Pt responded well to medication. Pt laughs when given medication and responds with laughter to questions. States pain is "ok now".

## 2015-12-17 NOTE — ED Notes (Signed)
Pt called nurse to room, requested for medication coverage for vaginal yeast infection with her antibiotic script as she is prone to yeast infections with antibiotics. EDP notified.

## 2015-12-17 NOTE — Discharge Instructions (Signed)
°  Colitis °Colitis is inflammation of the colon. Colitis may last a short time (acute) or it may last a long time (chronic). °CAUSES °This condition may be caused by: °· Viruses. °· Bacteria. °· Reactions to medicine. °· Certain autoimmune diseases, such as Crohn disease or ulcerative colitis. °SYMPTOMS °Symptoms of this condition include: °· Diarrhea. °· Passing bloody or tarry stool. °· Pain. °· Fever. °· Vomiting. °· Tiredness (fatigue). °· Weight loss. °· Bloating. °· Sudden increase in abdominal pain. °· Having fewer bowel movements than usual. °DIAGNOSIS °This condition is diagnosed with a stool test or a blood test. You may also have other tests, including X-rays, a CT scan, or a colonoscopy. °TREATMENT °Treatment may include: °· Resting the bowel. This involves not eating or drinking for a period of time. °· Fluids that are given through an IV tube. °· Medicine for pain and diarrhea. °· Antibiotic medicines. °· Cortisone medicines. °· Surgery. °HOME CARE INSTRUCTIONS °Eating and Drinking °· Follow instructions from your health care provider about eating or drinking restrictions. °· Drink enough fluid to keep your urine clear or pale yellow. °· Work with a dietitian to determine which foods cause your condition to flare up. °· Avoid foods that cause flare-ups. °· Eat a well-balanced diet. °Medicines °· Take over-the-counter and prescription medicines only as told by your health care provider. °· If you were prescribed an antibiotic medicine, take it as told by your health care provider. Do not stop taking the antibiotic even if you start to feel better. °General Instructions °· Keep all follow-up visits as told by your health care provider. This is important. °SEEK MEDICAL CARE IF: °· Your symptoms do not go away. °· You develop new symptoms. °SEEK IMMEDIATE MEDICAL CARE IF: °· You have a fever that does not go away with treatment. °· You develop chills. °· You have extreme weakness, fainting, or  dehydration. °· You have repeated vomiting. °· You develop severe pain in your abdomen. °· You pass bloody or tarry stool. °  °This information is not intended to replace advice given to you by your health care provider. Make sure you discuss any questions you have with your health care provider. °  °Document Released: 01/09/2005 Document Revised: 08/23/2015 Document Reviewed: 03/27/2015 °Elsevier Interactive Patient Education ©2016 [employer] ° °

## 2015-12-17 NOTE — ED Notes (Signed)
Pt states she attempted to give urine sample and was not able to urinate. Asked her again to try to give a sample as Dr. Hazel Sams one.  Pt stated she was "hoping he wouldn't need it". Advised pt that sample was still needed and she said she would try again.

## 2015-12-17 NOTE — ED Notes (Signed)
Pt reports abdominal pain for the past 2 day. Pt states pain is intermittent. Denies any vomiting or diarrhea. Pt denies having any blood in her stool.

## 2015-12-19 LAB — POC OCCULT BLOOD, ED: Fecal Occult Bld: NEGATIVE

## 2016-01-03 ENCOUNTER — Encounter (HOSPITAL_COMMUNITY): Payer: Self-pay

## 2016-01-03 ENCOUNTER — Emergency Department (HOSPITAL_COMMUNITY): Payer: Medicare HMO

## 2016-01-03 ENCOUNTER — Emergency Department (HOSPITAL_COMMUNITY)
Admission: EM | Admit: 2016-01-03 | Discharge: 2016-01-03 | Disposition: A | Discharge status: Home / Self Care | Payer: Medicare HMO | Attending: Emergency Medicine | Admitting: Emergency Medicine

## 2016-01-03 DIAGNOSIS — R1032 Left lower quadrant pain: Secondary | ICD-10-CM | POA: Insufficient documentation

## 2016-01-03 DIAGNOSIS — F17200 Nicotine dependence, unspecified, uncomplicated: Secondary | ICD-10-CM | POA: Diagnosis not present

## 2016-01-03 DIAGNOSIS — E079 Disorder of thyroid, unspecified: Secondary | ICD-10-CM | POA: Diagnosis not present

## 2016-01-03 DIAGNOSIS — Z90710 Acquired absence of both cervix and uterus: Secondary | ICD-10-CM | POA: Diagnosis not present

## 2016-01-03 DIAGNOSIS — R109 Unspecified abdominal pain: Secondary | ICD-10-CM

## 2016-01-03 DIAGNOSIS — Z9049 Acquired absence of other specified parts of digestive tract: Secondary | ICD-10-CM | POA: Diagnosis not present

## 2016-01-03 DIAGNOSIS — Z7984 Long term (current) use of oral hypoglycemic drugs: Secondary | ICD-10-CM | POA: Diagnosis not present

## 2016-01-03 DIAGNOSIS — F419 Anxiety disorder, unspecified: Secondary | ICD-10-CM | POA: Insufficient documentation

## 2016-01-03 DIAGNOSIS — F329 Major depressive disorder, single episode, unspecified: Secondary | ICD-10-CM | POA: Diagnosis not present

## 2016-01-03 DIAGNOSIS — J45909 Unspecified asthma, uncomplicated: Secondary | ICD-10-CM | POA: Insufficient documentation

## 2016-01-03 DIAGNOSIS — E669 Obesity, unspecified: Secondary | ICD-10-CM | POA: Diagnosis not present

## 2016-01-03 DIAGNOSIS — Z79899 Other long term (current) drug therapy: Secondary | ICD-10-CM | POA: Diagnosis not present

## 2016-01-03 DIAGNOSIS — G8929 Other chronic pain: Secondary | ICD-10-CM | POA: Diagnosis not present

## 2016-01-03 DIAGNOSIS — K589 Irritable bowel syndrome without diarrhea: Secondary | ICD-10-CM | POA: Diagnosis not present

## 2016-01-03 DIAGNOSIS — K219 Gastro-esophageal reflux disease without esophagitis: Secondary | ICD-10-CM | POA: Diagnosis not present

## 2016-01-03 DIAGNOSIS — I10 Essential (primary) hypertension: Secondary | ICD-10-CM | POA: Diagnosis not present

## 2016-01-03 DIAGNOSIS — G43909 Migraine, unspecified, not intractable, without status migrainosus: Secondary | ICD-10-CM | POA: Insufficient documentation

## 2016-01-03 DIAGNOSIS — Z9889 Other specified postprocedural states: Secondary | ICD-10-CM | POA: Diagnosis not present

## 2016-01-03 DIAGNOSIS — Z8619 Personal history of other infectious and parasitic diseases: Secondary | ICD-10-CM | POA: Insufficient documentation

## 2016-01-03 LAB — COMPREHENSIVE METABOLIC PANEL
ALT: 17 U/L (ref 14–54)
AST: 23 U/L (ref 15–41)
Albumin: 4.7 g/dL (ref 3.5–5.0)
Alkaline Phosphatase: 52 U/L (ref 38–126)
Anion gap: 11 (ref 5–15)
BUN: 11 mg/dL (ref 6–20)
CHLORIDE: 104 mmol/L (ref 101–111)
CO2: 26 mmol/L (ref 22–32)
CREATININE: 1.22 mg/dL — AB (ref 0.44–1.00)
Calcium: 10.2 mg/dL (ref 8.9–10.3)
GFR calc Af Amer: 56 mL/min — ABNORMAL LOW (ref >60)
GFR calc non Af Amer: 49 mL/min — ABNORMAL LOW (ref >60)
Glucose, Bld: 109 mg/dL — ABNORMAL HIGH (ref 65–99)
Potassium: 4.7 mmol/L (ref 3.5–5.1)
SODIUM: 141 mmol/L (ref 135–145)
Total Bilirubin: 0.3 mg/dL (ref 0.3–1.2)
Total Protein: 8.4 g/dL — ABNORMAL HIGH (ref 6.5–8.1)

## 2016-01-03 LAB — CBC WITH DIFFERENTIAL/PLATELET
BASOS ABS: 0 10*3/uL (ref 0.0–0.1)
Basophils Relative: 0 %
EOS ABS: 0.3 10*3/uL (ref 0.0–0.7)
Eosinophils Relative: 3 %
HCT: 44.3 % (ref 36.0–46.0)
Hemoglobin: 15.1 g/dL — ABNORMAL HIGH (ref 12.0–15.0)
Lymphocytes Relative: 54 %
Lymphs Abs: 4.8 10*3/uL — ABNORMAL HIGH (ref 0.7–4.0)
MCH: 31.5 pg (ref 26.0–34.0)
MCHC: 34.1 g/dL (ref 30.0–36.0)
MCV: 92.5 fL (ref 78.0–100.0)
MONO ABS: 0.5 10*3/uL (ref 0.1–1.0)
Monocytes Relative: 5 %
Neutro Abs: 3.4 10*3/uL (ref 1.7–7.7)
Neutrophils Relative %: 38 %
Platelets: 250 10*3/uL (ref 150–400)
RBC: 4.79 MIL/uL (ref 3.87–5.11)
RDW: 14 % (ref 11.5–15.5)
WBC: 9 10*3/uL (ref 4.0–10.5)

## 2016-01-03 LAB — URINE MICROSCOPIC-ADD ON

## 2016-01-03 LAB — URINALYSIS, ROUTINE W REFLEX MICROSCOPIC
BILIRUBIN URINE: NEGATIVE
Glucose, UA: NEGATIVE mg/dL
KETONES UR: NEGATIVE mg/dL
Leukocytes, UA: NEGATIVE
NITRITE: NEGATIVE
PH: 6 (ref 5.0–8.0)
Protein, ur: NEGATIVE mg/dL
Specific Gravity, Urine: 1.01 (ref 1.005–1.030)

## 2016-01-03 LAB — LIPASE, BLOOD: Lipase: 32 U/L (ref 11–51)

## 2016-01-03 MED ORDER — HYDROCODONE-ACETAMINOPHEN 5-325 MG PO TABS: 1.0000 | ORAL_TABLET | Freq: Four times a day (QID) | ORAL | Status: DC | PRN

## 2016-01-03 MED ORDER — CIPROFLOXACIN HCL 500 MG PO TABS
500.0000 mg | ORAL_TABLET | Freq: Two times a day (BID) | ORAL | Status: DC
Start: 2016-01-03 — End: 2016-01-15

## 2016-01-03 MED ORDER — METRONIDAZOLE 500 MG PO TABS: 500.0000 mg | ORAL_TABLET | Freq: Two times a day (BID) | ORAL | Status: DC

## 2016-01-03 MED ORDER — ONDANSETRON HCL 4 MG/2ML IJ SOLN
4.0000 mg | Freq: Once | INTRAMUSCULAR | Status: AC
Start: 2016-01-03 — End: 2016-01-03
  Administered 2016-01-03: 4 mg via INTRAVENOUS
  Filled 2016-01-03: qty 2

## 2016-01-03 MED ORDER — MORPHINE SULFATE (PF) 4 MG/ML IV SOLN
4.0000 mg | Freq: Once | INTRAVENOUS | Status: AC
  Administered 2016-01-03: 4 mg via INTRAVENOUS
  Filled 2016-01-03: qty 1

## 2016-01-03 MED ORDER — DIATRIZOATE MEGLUMINE & SODIUM 66-10 % PO SOLN
ORAL | Status: AC
  Filled 2016-01-03: qty 30

## 2016-01-03 MED ORDER — IOHEXOL 300 MG/ML  SOLN
100.0000 mL | Freq: Once | INTRAMUSCULAR | Status: AC | PRN
  Administered 2016-01-03: 100 mL via INTRAVENOUS

## 2016-01-03 NOTE — ED Notes (Signed)
Patient reports of lower abdominal pain since the first of the years. States she was dx with colitis and has not got any better. Reports of unable to eat r/t pain. Denies fever.

## 2016-01-03 NOTE — ED Provider Notes (Signed)
CSN: [id number]     Arrival date & time 01/03/16  1753 History  By signing my name below, I, [name], attest that this documentation has been prepared under the direction and in the presence of Milton Ferguson, MD. Electronically Signed: Julien Nordmann, ED Scribe. 01/03/2016. 9:00 PM.    Chief Complaint  Patient presents with  . Abdominal Pain     Patient is a 55 y.o. female presenting with abdominal pain. The history is provided by the patient. No language interpreter was used.  Abdominal Pain Pain location:  LLQ and suprapubic Pain radiates to:  Does not radiate Pain severity:  Moderate Onset quality:  Gradual Duration:  2 weeks Timing:  Constant Progression:  Worsening Chronicity:  New Context: eating   Relieved by:  Nothing Worsened by:  Nothing tried Ineffective treatments:  None tried Associated symptoms: no chest pain, no cough, no diarrhea, no fatigue and no hematuria    HPI Comments: [name] is a 55 y.o. female who has a hx of HTN, IBS, diverticulitis, and terminal iletits of small intestine presents to the Emergency Department complaining of constant, moderate, gradual worsening suprapubic and LLQ abdominal pain onset two weeks ago. She endorses having difficulty eating without having pain. Pt was seen here on 12/17/15 for the same problem. Pt received some medication that did not alleviate her pain. Pt denies fever.  Past Medical History  Diagnosis Date  . HTN (hypertension)   . GERD (gastroesophageal reflux disease)   . Obesity   . Anxiety   . Depression   . IBS (irritable bowel syndrome)   . Migraine   . Chronic back pain   . Neck pain, chronic   . Diverticulitis     complicated by abscess requiring total colectomy and take-down of colostomy in 2004  . Asthma   . Terminal ileitis of small intestine ([place])     referred to [facility], Crohn's ruled out  . [facility] spotted fever   . Thyroid disease    Past Surgical History  Procedure Laterality  Date  . Subtotal colectomy  2004    diverticulitis with abscess  . Hysterectomy      partial  . Esophagogastroduodenoscopy  02/2009    mild erosive reflux esophagitis  . Colonoscopy  03/2009    anastomotic ulcers and neoterminal ileum ulcers, anastomosis with SB at 30cm  . Esophagogastroduodenoscopy  07/2010    probable cervical esophageal web s/p dilation  . Umbilical hernia repair    . Cholecystectomy  2004  . Liver biopsy  2004    steatosis  . Appendectomy    . Abdominal hysterectomy     Family History  Problem Relation Age of Onset  . ALS Father   . Crohn's disease Daughter   . Diabetes Mother   . Rheum arthritis Mother    Social History  Substance Use Topics  . Smoking status: Current Some Day Smoker  . Smokeless tobacco: Never Used  . Alcohol Use: Yes   OB History    No data available     Review of Systems  Constitutional: Negative for appetite change and fatigue.  HENT: Negative for congestion, ear discharge and sinus pressure.   Eyes: Negative for discharge.  Respiratory: Negative for cough.   Cardiovascular: Negative for chest pain.  Gastrointestinal: Positive for abdominal pain. Negative for diarrhea.  Genitourinary: Negative for frequency and hematuria.  Musculoskeletal: Negative for back pain.  Skin: Negative for rash.  Neurological: Negative for seizures and headaches.  Psychiatric/Behavioral:  Negative for hallucinations.      Allergies  Hydromorphone hcl  Home Medications   Prior to Admission medications   Medication Sig Start Date End Date Taking? Authorizing Provider  dicyclomine (BENTYL) 10 MG capsule Take 10 mg by mouth 4 (four) times daily -  before meals and at bedtime.     Yes Historical Provider, MD  gabapentin (NEURONTIN) 300 MG capsule Take 300 mg by mouth 2 (two) times daily.     Yes Historical Provider, MD  imipramine (TOFRANIL) 10 MG tablet Take 40 mg by mouth at bedtime.     Yes Historical Provider, MD  levothyroxine (SYNTHROID,  LEVOTHROID) 50 MCG tablet Take 50 mcg by mouth daily before breakfast.   Yes Historical Provider, MD  metFORMIN (GLUCOPHAGE) 500 MG tablet Take 500 mg by mouth daily.   Yes Historical Provider, MD  Multiple Vitamins-Calcium (ONE-A-DAY WOMENS FORMULA PO) Take 1 tablet by mouth daily. Alternating each week with Caltrate w/D    Yes Historical Provider, MD  Omega-3 Fatty Acids (FISH OIL) 1200 MG CAPS Take 1 capsule by mouth 2 (two) times daily.   Yes Historical Provider, MD  QUEtiapine (SEROQUEL) 50 MG tablet Take 50 mg by mouth at bedtime.     Yes Historical Provider, MD  ranitidine (ZANTAC) 300 MG tablet Take 300 mg by mouth at bedtime.   Yes Historical Provider, MD  spironolactone (ALDACTONE) 50 MG tablet Take 50 mg by mouth 2 (two) times daily.    Yes Historical Provider, MD  amoxicillin-clavulanate (AUGMENTIN) 875-125 MG tablet Take 1 tablet by mouth 2 (two) times daily. One po bid x 7 days Patient not taking: Reported on 01/03/2016 12/17/15   Ripley Fraise, MD  fluconazole (DIFLUCAN) 150 MG tablet Take 1 tablet (150 mg total) by mouth daily. Patient not taking: Reported on 01/03/2016 12/17/15   Ripley Fraise, MD  oxyCODONE-acetaminophen (PERCOCET/ROXICET) 5-325 MG tablet Take 1 tablet by mouth every 4 (four) hours as needed for severe pain. Patient not taking: Reported on 01/03/2016 12/17/15   Ripley Fraise, MD   Triage vitals: BP 117/86 mmHg  Pulse 98  Temp(Src) 98.3 F (36.8 C) (Oral)  Resp 18  Ht 5\' 3"  (1.6 m)  Wt 148 lb (67.132 kg)  BMI 26.22 kg/m2  SpO2 100% Physical Exam  Constitutional: She is oriented to person, place, and time. She appears well-developed.  HENT:  Head: Normocephalic.  Eyes: Conjunctivae and EOM are normal. No scleral icterus.  Neck: Neck supple. No thyromegaly present.  Cardiovascular: Normal rate and regular rhythm.  Exam reveals no gallop and no friction rub.   No murmur heard. Pulmonary/Chest: No stridor. She has no wheezes. She has no rales. She exhibits no  tenderness.  Abdominal: She exhibits no distension. There is tenderness. There is no rebound.  Moderate LLQ and suprapubic tenderness  Musculoskeletal: Normal range of motion. She exhibits no edema.  Lymphadenopathy:    She has no cervical adenopathy.  Neurological: She is oriented to person, place, and time. She exhibits normal muscle tone. Coordination normal.  Skin: No rash noted. No erythema.  Psychiatric: She has a normal mood and affect. Her behavior is normal.  Nursing note and vitals reviewed.   ED Course  Procedures  DIAGNOSTIC STUDIES: Oxygen Saturation is 100% on RA, normal by my interpretation.  COORDINATION OF CARE:  8:59 PM Discussed treatment plan which includes lab work, CT of pelvis, and Zofran with pt at bedside and pt agreed to plan.  Labs Review Labs Reviewed  CBC WITH  DIFFERENTIAL/PLATELET  COMPREHENSIVE METABOLIC PANEL  URINALYSIS, ROUTINE W REFLEX MICROSCOPIC (NOT AT [facility])    Imaging Review No results found. I have personally reviewed and evaluated these images and lab results as part of my medical decision-making.   EKG Interpretation None      MDM   Final diagnoses:  None   Patient with lower abdominal pain for number weeks. Initially diagnosed with colitis but not getting better. Patient will be put on Cipro and Flagyl and pain medicines today and will follow-up with the GI doctor next week. CT scan abdomen was unremarkable today  Milton Ferguson, MD 01/05/16 1232

## 2016-01-03 NOTE — Discharge Instructions (Signed)
Follow up with dr. Fields next week °

## 2016-01-04 DIAGNOSIS — E1165 Type 2 diabetes mellitus with hyperglycemia: Secondary | ICD-10-CM | POA: Diagnosis not present

## 2016-01-04 DIAGNOSIS — I10 Essential (primary) hypertension: Secondary | ICD-10-CM | POA: Diagnosis not present

## 2016-01-04 DIAGNOSIS — Z0000 Encounter for general adult medical examination without abnormal findings: Secondary | ICD-10-CM | POA: Diagnosis not present

## 2016-01-04 DIAGNOSIS — Z6833 Body mass index (BMI) 33.0-33.9, adult: Secondary | ICD-10-CM | POA: Diagnosis not present

## 2016-01-05 ENCOUNTER — Encounter: Payer: Self-pay | Admitting: Gastroenterology

## 2016-01-05 DIAGNOSIS — I10 Essential (primary) hypertension: Secondary | ICD-10-CM | POA: Diagnosis not present

## 2016-01-05 DIAGNOSIS — Z79899 Other long term (current) drug therapy: Secondary | ICD-10-CM | POA: Diagnosis not present

## 2016-01-05 DIAGNOSIS — R1030 Lower abdominal pain, unspecified: Secondary | ICD-10-CM | POA: Diagnosis not present

## 2016-01-05 DIAGNOSIS — R112 Nausea with vomiting, unspecified: Secondary | ICD-10-CM | POA: Diagnosis not present

## 2016-01-05 DIAGNOSIS — R197 Diarrhea, unspecified: Secondary | ICD-10-CM | POA: Diagnosis not present

## 2016-01-05 DIAGNOSIS — R1013 Epigastric pain: Secondary | ICD-10-CM | POA: Diagnosis not present

## 2016-01-05 DIAGNOSIS — Z7984 Long term (current) use of oral hypoglycemic drugs: Secondary | ICD-10-CM | POA: Diagnosis not present

## 2016-01-15 ENCOUNTER — Ambulatory Visit (INDEPENDENT_AMBULATORY_CARE_PROVIDER_SITE_OTHER): Payer: Medicare HMO | Admitting: Gastroenterology

## 2016-01-15 ENCOUNTER — Other Ambulatory Visit: Payer: Self-pay

## 2016-01-15 ENCOUNTER — Encounter: Payer: Self-pay | Admitting: Gastroenterology

## 2016-01-15 ENCOUNTER — Telehealth: Payer: Self-pay

## 2016-01-15 VITALS — BP 120/89 | HR 76 | Temp 97.6°F | Ht 63.0 in | Wt 184.2 lb

## 2016-01-15 DIAGNOSIS — R935 Abnormal findings on diagnostic imaging of other abdominal regions, including retroperitoneum: Secondary | ICD-10-CM

## 2016-01-15 DIAGNOSIS — R110 Nausea: Secondary | ICD-10-CM

## 2016-01-15 DIAGNOSIS — K625 Hemorrhage of anus and rectum: Secondary | ICD-10-CM

## 2016-01-15 DIAGNOSIS — R634 Abnormal weight loss: Secondary | ICD-10-CM | POA: Diagnosis not present

## 2016-01-15 DIAGNOSIS — K580 Irritable bowel syndrome with diarrhea: Secondary | ICD-10-CM | POA: Diagnosis not present

## 2016-01-15 DIAGNOSIS — R1030 Lower abdominal pain, unspecified: Secondary | ICD-10-CM

## 2016-01-15 DIAGNOSIS — R1013 Epigastric pain: Secondary | ICD-10-CM

## 2016-01-15 DIAGNOSIS — R197 Diarrhea, unspecified: Secondary | ICD-10-CM

## 2016-01-15 MED ORDER — HYDROCORTISONE 2.5 % RE CREA: 1.0000 "application " | TOPICAL_CREAM | Freq: Two times a day (BID) | RECTAL | Status: DC

## 2016-01-15 NOTE — Progress Notes (Signed)
Primary Care Physician: Neale Burly, MD  Primary Gastroenterologist:  Garfield Cornea, MD   Chief Complaint  Patient presents with  . Abdominal Pain    HPI: [name] is a 55 y.o. female here for further evaluation of abdominal pain. Last seen in 03/2011. She has a history of subtotal colectomy for complicated diverticulitis back in 2004. She also has h/o chronic abdominal pain, IBS, GERD, cervical esophageal web, abnormal LFTs with liver bx (don't have records at this time).   History of rectal ulcers on colonoscopy in December 2005 by Dr. Irving Shows. In April 2010 she underwent a colonoscopy by Dr. Gala Romney showing a normal rectum, subtotal colectomy. Surgical anastomosis to the small bowel at 30 cm. She had anastomotic ulcers and neoterminal ileal ulcers. Biopsies suggestive of Crohn's. She failed to respond Entocort. She had a small bowel Givens in December 2010 that showed subtly abnormal terminal ileum with erythema and edema. She was evaluated by Dr. Koleen Distance at [facility] (who also performed a colonoscopy and reportedly found no ileal ulcers) and was felt that she did not have IBD.  Patient reports that she been doing okay prior to January. She started doing some heavy cleaning around the house. 34 days later she developed recurrent abdominal pain. She felt like maybe she into herself. The cause of her multiple abdominal surgery she became concerned and went to the emergency department for evaluation. CT on January 1 with sigmoid colon mild wall thickening versus intraluminal contents. She reports being treated for colitis with antibiotic. Symptoms did not improve so she went back to the emergency department and had a second CT scan on January 18. Noted to have fatty liver. Probable avascular necrosis of the right femoral head. Otherwise unremarkable.  She reports 10 pound weight loss. Cannot eat anything but soup. Anything heavier causes abdominal pain in the lower abdomen. Had  baseline tends to have multiple stools daily. Stools may be somewhat more loose even with the addition of Colestid by Dr. Scarlette Ar. Also on Bentyl. 3-4 loose stools daily. Some bright red blood per rectum she felt was related to hemorrhoids. No melena. Some nausea without vomiting. Heartburn well-controlled. No dysphagia. Nexium no longer covered by insurance. She is taking Zantac. Naproxen is on her medication list. She states she's not taken them. Denies any NSAIDs or aspirin. Denies hip pain. She was unaware of CT findings of her right femoral head. Discussed with patient and encouraged follow-up with orthopedic.   Current Outpatient Prescriptions  Medication Sig Dispense Refill  . colestipol (COLESTID) 1 g tablet Take 1 g by mouth 3 (three) times daily.    Marland Kitchen dicyclomine (BENTYL) 10 MG capsule Take 10 mg by mouth 4 (four) times daily -  before meals and at bedtime.      . gabapentin (NEURONTIN) 300 MG capsule Take 300 mg by mouth 2 (two) times daily.      Marland Kitchen imipramine (TOFRANIL) 10 MG tablet Take 40 mg by mouth at bedtime.      Marland Kitchen levothyroxine (SYNTHROID, LEVOTHROID) 50 MCG tablet Take 50 mcg by mouth daily before breakfast.    . metFORMIN (GLUCOPHAGE) 500 MG tablet Take 500 mg by mouth daily.    . Multiple Vitamins-Calcium (ONE-A-DAY WOMENS FORMULA PO) Take 1 tablet by mouth daily. Alternating each week with Caltrate w/D     . NAPROXEN PO Take 50 mg by mouth as needed.    . Omega-3 Fatty Acids (FISH OIL) 1200 MG CAPS Take 1  capsule by mouth 2 (two) times daily.    . QUEtiapine (SEROQUEL) 50 MG tablet Take 50 mg by mouth at bedtime.      . ranitidine (ZANTAC) 300 MG tablet Take 300 mg by mouth at bedtime.    Marland Kitchen spironolactone (ALDACTONE) 50 MG tablet Take 50 mg by mouth 2 (two) times daily.      No current facility-administered medications for this visit.    Allergies as of 01/15/2016 - Review Complete 01/15/2016  Allergen Reaction Noted  . Hydromorphone hcl Itching    Past Medical History    Diagnosis Date  . HTN (hypertension)   . GERD (gastroesophageal reflux disease)   . Obesity   . Anxiety   . Depression   . IBS (irritable bowel syndrome)   . Migraine   . Chronic back pain   . Neck pain, chronic   . Diverticulitis     complicated by abscess requiring total colectomy and take-down of colostomy in 2004  . Asthma   . Terminal ileitis of small intestine ([place])     referred to [facility], Crohn's ruled out  . [facility] spotted fever   . Thyroid disease    Past Surgical History  Procedure Laterality Date  . Subtotal colectomy  2004    diverticulitis with abscess  . Hysterectomy  2009    partial  . Esophagogastroduodenoscopy  02/2009    mild erosive reflux esophagitis  . Colonoscopy  03/2009    anastomotic ulcers and neoterminal ileum ulcers, anastomosis with SB at 30cm. bx s/o Crohn's  . Esophagogastroduodenoscopy  07/2010    probable cervical esophageal web s/p dilation  . Umbilical hernia repair    . Cholecystectomy  2004    at time of colectomy  . Liver biopsy  2004    steatosis, at time of colectomy  . Appendectomy    . Ethmoid mucocele, turbinate surgery  2002    right  . Colonoscopy  2005    Dr. Irving Shows: rectal ulcers  . Givens capsule study  11/2009    Dr. Gala Romney: subtle erytherma and edema of TI  . Colonoscopy  2011    [facility]   Family History  Problem Relation Age of Onset  . ALS Father   . Crohn's disease Daughter   . Diabetes Mother   . Rheum arthritis Mother    Social History   Social History  . Marital Status: Married    Spouse Name: N/A  . Number of Children: 2  . Years of Education: N/A   Occupational History  .     Social History Main Topics  . Smoking status: Former Research scientist (life sciences)  . Smokeless tobacco: Never Used  . Alcohol Use: 0.0 oz/week    0 Standard drinks or equivalent per week  . Drug Use: No  . Sexual Activity: Yes    Birth Control/ Protection: Surgical   Other Topics Concern  . None   Social History Narrative      ROS:  General: see hpi ENT: Negative for hoarseness, difficulty swallowing , nasal congestion. CV: Negative for chest pain, angina, palpitations, dyspnea on exertion, peripheral edema.  Respiratory: Negative for dyspnea at rest, dyspnea on exertion, cough, sputum, wheezing.  GI: See history of present illness. GU:  Negative for dysuria, hematuria, urinary incontinence, urinary frequency, nocturnal urination.  Endo: see hpi   Physical Examination:   BP 120/89 mmHg  Pulse 76  Temp(Src) 97.6 F (36.4 C)  Ht 5\' 3"  (1.6 m)  Wt 184 lb  3.2 oz (83.553 kg)  BMI 32.64 kg/m2  General: Well-nourished, well-developed in no acute distress.  Eyes: No icterus. Mouth: Oropharyngeal mucosa moist and pink , no lesions erythema or exudate. Lungs: Clear to auscultation bilaterally.  Heart: Regular rate and rhythm, no murmurs rubs or gallops.  Abdomen: Bowel sounds are normal, moderate epigastric tenderness, moderate suprapubic tenderness, nondistended, no hepatosplenomegaly or masses, no abdominal bruits or hernia , no rebound or guarding.   Extremities: No lower extremity edema. No clubbing or deformities. Neuro: Alert and oriented x 4   Skin: Warm and dry, no jaundice.   Psych: Alert and cooperative, normal mood and affect.  Labs:  Lab Results  Component Value Date   CREATININE 1.22* 01/03/2016   BUN 11 01/03/2016   NA 141 01/03/2016   K 4.7 01/03/2016   CL 104 01/03/2016   CO2 26 01/03/2016   Lab Results  Component Value Date   ALT 17 01/03/2016   AST 23 01/03/2016   ALKPHOS 52 01/03/2016   BILITOT 0.3 01/03/2016   Lab Results  Component Value Date   WBC 9.0 01/03/2016   HGB 15.1* 01/03/2016   HCT 44.3 01/03/2016   MCV 92.5 01/03/2016   PLT 250 01/03/2016   Lab Results  Component Value Date   LIPASE 32 01/03/2016    Imaging Studies: Ct Abdomen Pelvis W Contrast  01/03/2016  CLINICAL DATA:  55 year old female with constant moderate suprapubic and left lower  quadrant abdominal pain for the past 2 weeks, progressively worsening. Difficulty eating without developing abdominal pain. EXAM: CT ABDOMEN AND PELVIS WITH CONTRAST TECHNIQUE: Multidetector CT imaging of the abdomen and pelvis was performed using the standard protocol following bolus administration of intravenous contrast. CONTRAST:  121mL OMNIPAQUE IOHEXOL 300 MG/ML  SOLN COMPARISON:  CT the abdomen and pelvis 12/17/2015. FINDINGS: Lower chest:  Unremarkable. Hepatobiliary: No definite cystic or solid hepatic lesions. No intra or extrahepatic biliary ductal dilatation. Mild diffuse decreased attenuation throughout the hepatic parenchyma, compatible with hepatic steatosis. Status post cholecystectomy. Pancreas: No pancreatic mass. No pancreatic ductal dilatation. No pancreatic or peripancreatic fluid or inflammatory changes. Spleen: Unremarkable. Adrenals/Urinary Tract: Left kidney and bilateral adrenal glands are normal in appearance. Sub cm low-attenuation lesion in the lateral aspect of the interpolar region of the right kidney is too small to definitively characterize, but is similar to the prior study, likely a tiny cyst. No hydroureteronephrosis. Urinary bladder is normal in appearance. Stomach/Bowel: Normal appearance of the stomach. Postoperative changes of subtotal colectomy are noted. Side-to-side ileocolic anastomosis noted. Vascular/Lymphatic: No significant atherosclerotic disease, aneurysm or dissection identified in the abdominal or pelvic vasculature. No lymphadenopathy noted in the abdomen or pelvis. Reproductive: Uterus and ovaries are unremarkable in appearance. Other: No significant volume of ascites.  No pneumoperitoneum. Musculoskeletal: Extensive sclerosis in the right femoral head, suggesting areas of avascular necrosis. There are no other aggressive appearing lytic or blastic lesions noted in the visualized portions of the skeleton. IMPRESSION: 1. No acute findings in the abdomen or pelvis  to account for the patient's symptoms. 2. Hepatic steatosis. 3. Postoperative changes, as above. 4. Probable avascular necrosis of the right femoral head. Electronically Signed   By: Vinnie Langton M.D.   On: 01/03/2016 22:01   Ct Abdomen Pelvis W Contrast  12/17/2015  CLINICAL DATA:  Acute onset of bilateral lower quadrant abdominal pain. Initial encounter. EXAM: CT ABDOMEN AND PELVIS WITH CONTRAST TECHNIQUE: Multidetector CT imaging of the abdomen and pelvis was performed using the standard protocol following bolus administration  of intravenous contrast. CONTRAST:  173mL OMNIPAQUE IOHEXOL 300 MG/ML  SOLN COMPARISON:  None. FINDINGS: The visualized lung bases are clear. The liver and spleen are unremarkable in appearance. The patient is status post cholecystectomy, with clips noted at the gallbladder fossa. The pancreas and adrenal glands are unremarkable. A 6 mm cyst is noted in the periphery of the right kidney. The kidneys are otherwise unremarkable. There is no evidence of hydronephrosis. No renal or ureteral stones are seen. No perinephric stranding is appreciated. No free fluid is identified. The small bowel is unremarkable in appearance. The stomach is within normal limits. No acute vascular abnormalities are seen. The patient is status post resection of much of the colon. Associated anastomoses are grossly unremarkable. The remaining sigmoid colon demonstrates mild wall thickening, but this is thought reflect intraluminal contents. The bladder is mildly distended and grossly unremarkable. The patient is status post hysterectomy. No suspicious adnexal masses are seen. No inguinal lymphadenopathy is seen. No acute osseous abnormalities are identified. IMPRESSION: 1. No acute abnormality seen within the abdomen or pelvis. 2. Mild apparent wall thickening at the sigmoid colon is thought to reflect intraluminal contents, though depending on the degree of clinical concern, colonoscopy could be considered for  further evaluation. 3. Small right renal cyst noted. Electronically Signed   By: Garald Balding M.D.   On: 12/17/2015 06:35    Impression/plan  55 year old female who presents for further evaluation of recurrent abdominal pain, worsening bowel function. Extensive evaluation in the past. She is status post subtotal colectomy for complicated diverticulitis around 2004. History of anastomotic ulcers in neoterminal ileal ulcers at time of colonoscopy in 2010, pathology suggestive of Crohn's. She fell response to Entocort. Subsequently had a small bowel capsule endoscopy with subtle abnormality with erythema and edema of the terminal ileum. Second opinion with Dr. Koleen Distance at [facility], repeat colonoscopy with reportedly no ileal ulceration and Crohn's/IBD not suspected.  Now with recurrent abdominal pain, initial CT with questionable wall thickening of the sigmoid colon filling to respond to antibiotic therapy. Looser stools. Given previous history recommend ileocolonoscopy for further evaluation. She has quite significant epigastric tenderness associated with nausea as well. Upper endoscopy at time of ileocolonoscopy planned.  I have discussed the risks, alternatives, benefits with regards to but not limited to the risk of reaction to medication, bleeding, infection, perforation and the patient is agreeable to proceed. Written consent to be obtained.  Augment conscious sedation with Phenergan 25 mg IV 30 minutes before the procedure. Patient previously did well with conscious sedation.  Anusol cream applied rectally twice a day for hemorrhoids. Patient would like to be evaluated for possible [facility] hemorrhoid banding.

## 2016-01-15 NOTE — Telephone Encounter (Signed)
Pt states that she seen [facility] today and forgot to ask what she could take for her stomach pain.

## 2016-01-15 NOTE — Patient Instructions (Addendum)
1. Colonoscopy and upper endoscopy with Dr. Gala Romney. See separate instructions.  2. You will need to HOLD bentyl and colestid starting five days before your procedures.  3. Start anusol cream anorectally twice daily for hemorrhoids. rx sent to pharmacy.

## 2016-01-15 NOTE — Progress Notes (Signed)
CC'D TO PCP °

## 2016-01-16 ENCOUNTER — Telehealth: Payer: Self-pay | Admitting: Internal Medicine

## 2016-01-16 NOTE — Telephone Encounter (Signed)
Routing to [facility]- pt was seen yesterday

## 2016-01-16 NOTE — Telephone Encounter (Signed)
PATIENT CALLED INQUIRING WHAT SHE COULD TAKE FOR HER STOMACH ISSUES.

## 2016-01-17 MED ORDER — OMEPRAZOLE 20 MG PO CPDR: 20.0000 mg | DELAYED_RELEASE_CAPSULE | Freq: Every day | ORAL | Status: DC

## 2016-01-17 NOTE — Telephone Encounter (Signed)
Work up pending. Avoid narcotics for abdominal pain.  Would offer her PPI until TCS/EGD done. rx for omeprazole sent. She needs to make sure if she shows any signs of constipation, to hold colestid.

## 2016-01-17 NOTE — Telephone Encounter (Signed)
Pt is aware.  

## 2016-01-17 NOTE — Addendum Note (Signed)
Addended by: Mahala Menghini on: 01/17/2016 12:08 PM   Modules accepted: Orders

## 2016-01-30 ENCOUNTER — Encounter (HOSPITAL_COMMUNITY): Payer: Self-pay | Admitting: *Deleted

## 2016-01-30 ENCOUNTER — Ambulatory Visit (HOSPITAL_COMMUNITY)
Admission: RE | Admit: 2016-01-30 | Discharge: 2016-01-30 | Disposition: A | Discharge status: Home / Self Care | Payer: Medicare HMO | Source: Ambulatory Visit | Attending: Internal Medicine | Admitting: Internal Medicine

## 2016-01-30 ENCOUNTER — Encounter (HOSPITAL_COMMUNITY)
Admission: RE | Disposition: A | Discharge status: Home / Self Care | Payer: Self-pay | Source: Ambulatory Visit | Attending: Internal Medicine

## 2016-01-30 DIAGNOSIS — F329 Major depressive disorder, single episode, unspecified: Secondary | ICD-10-CM | POA: Insufficient documentation

## 2016-01-30 DIAGNOSIS — K625 Hemorrhage of anus and rectum: Secondary | ICD-10-CM

## 2016-01-30 DIAGNOSIS — R1013 Epigastric pain: Secondary | ICD-10-CM

## 2016-01-30 DIAGNOSIS — Z791 Long term (current) use of non-steroidal anti-inflammatories (NSAID): Secondary | ICD-10-CM | POA: Insufficient documentation

## 2016-01-30 DIAGNOSIS — I10 Essential (primary) hypertension: Secondary | ICD-10-CM | POA: Diagnosis not present

## 2016-01-30 DIAGNOSIS — Z7984 Long term (current) use of oral hypoglycemic drugs: Secondary | ICD-10-CM | POA: Diagnosis not present

## 2016-01-30 DIAGNOSIS — K449 Diaphragmatic hernia without obstruction or gangrene: Secondary | ICD-10-CM | POA: Insufficient documentation

## 2016-01-30 DIAGNOSIS — R634 Abnormal weight loss: Secondary | ICD-10-CM

## 2016-01-30 DIAGNOSIS — K621 Rectal polyp: Secondary | ICD-10-CM | POA: Insufficient documentation

## 2016-01-30 DIAGNOSIS — R197 Diarrhea, unspecified: Secondary | ICD-10-CM

## 2016-01-30 DIAGNOSIS — Z9049 Acquired absence of other specified parts of digestive tract: Secondary | ICD-10-CM | POA: Insufficient documentation

## 2016-01-30 DIAGNOSIS — D128 Benign neoplasm of rectum: Secondary | ICD-10-CM | POA: Insufficient documentation

## 2016-01-30 DIAGNOSIS — K921 Melena: Secondary | ICD-10-CM | POA: Diagnosis not present

## 2016-01-30 DIAGNOSIS — R110 Nausea: Secondary | ICD-10-CM | POA: Diagnosis not present

## 2016-01-30 DIAGNOSIS — R1030 Lower abdominal pain, unspecified: Secondary | ICD-10-CM | POA: Diagnosis not present

## 2016-01-30 DIAGNOSIS — K219 Gastro-esophageal reflux disease without esophagitis: Secondary | ICD-10-CM | POA: Insufficient documentation

## 2016-01-30 DIAGNOSIS — F419 Anxiety disorder, unspecified: Secondary | ICD-10-CM | POA: Insufficient documentation

## 2016-01-30 DIAGNOSIS — Z7951 Long term (current) use of inhaled steroids: Secondary | ICD-10-CM | POA: Diagnosis not present

## 2016-01-30 DIAGNOSIS — E669 Obesity, unspecified: Secondary | ICD-10-CM | POA: Insufficient documentation

## 2016-01-30 DIAGNOSIS — Z87891 Personal history of nicotine dependence: Secondary | ICD-10-CM | POA: Insufficient documentation

## 2016-01-30 DIAGNOSIS — K649 Unspecified hemorrhoids: Secondary | ICD-10-CM

## 2016-01-30 HISTORY — DX: Hypothyroidism, unspecified: E03.9

## 2016-01-30 HISTORY — PX: COLONOSCOPY: SHX5424

## 2016-01-30 HISTORY — PX: ESOPHAGOGASTRODUODENOSCOPY: SHX5428

## 2016-01-30 LAB — GLUCOSE, CAPILLARY: Glucose-Capillary: 98 mg/dL (ref 65–99)

## 2016-01-30 SURGERY — COLONOSCOPY
Anesthesia: Moderate Sedation

## 2016-01-30 MED ORDER — ONDANSETRON HCL 4 MG/2ML IJ SOLN
INTRAMUSCULAR | Status: DC | PRN
  Administered 2016-01-30: 4 mg via INTRAVENOUS

## 2016-01-30 MED ORDER — MIDAZOLAM HCL 5 MG/5ML IJ SOLN
INTRAMUSCULAR | Status: AC
  Filled 2016-01-30: qty 10

## 2016-01-30 MED ORDER — SODIUM CHLORIDE 0.9 % IV SOLN
INTRAVENOUS | Status: DC
  Administered 2016-01-30: 1000 mL via INTRAVENOUS

## 2016-01-30 MED ORDER — ONDANSETRON HCL 4 MG/2ML IJ SOLN
INTRAMUSCULAR | Status: AC
  Filled 2016-01-30: qty 2

## 2016-01-30 MED ORDER — PROMETHAZINE HCL 25 MG/ML IJ SOLN
INTRAMUSCULAR | Status: AC
  Filled 2016-01-30: qty 1

## 2016-01-30 MED ORDER — MEPERIDINE HCL 100 MG/ML IJ SOLN
INTRAMUSCULAR | Status: AC
  Filled 2016-01-30: qty 2

## 2016-01-30 MED ORDER — LIDOCAINE VISCOUS 2 % MT SOLN
OROMUCOSAL | Status: DC | PRN
  Administered 2016-01-30: 3 mL via OROMUCOSAL

## 2016-01-30 MED ORDER — SODIUM CHLORIDE 0.9% FLUSH
INTRAVENOUS | Status: AC
  Filled 2016-01-30: qty 10

## 2016-01-30 MED ORDER — MEPERIDINE HCL 100 MG/ML IJ SOLN
INTRAMUSCULAR | Status: DC | PRN
  Administered 2016-01-30: 25 mg via INTRAVENOUS
  Administered 2016-01-30: 50 mg via INTRAVENOUS
  Administered 2016-01-30: 25 mg via INTRAVENOUS

## 2016-01-30 MED ORDER — MIDAZOLAM HCL 5 MG/5ML IJ SOLN
INTRAMUSCULAR | Status: DC | PRN
  Administered 2016-01-30: 2 mg via INTRAVENOUS
  Administered 2016-01-30: 1 mg via INTRAVENOUS
  Administered 2016-01-30: 2 mg via INTRAVENOUS
  Administered 2016-01-30: 1 mg via INTRAVENOUS

## 2016-01-30 MED ORDER — STERILE WATER FOR IRRIGATION IR SOLN
Status: DC | PRN
  Administered 2016-01-30: 12:00:00

## 2016-01-30 MED ORDER — PROMETHAZINE HCL 25 MG/ML IJ SOLN
25.0000 mg | Freq: Once | INTRAMUSCULAR | Status: AC
  Administered 2016-01-30: 25 mg via INTRAVENOUS

## 2016-01-30 MED ORDER — LIDOCAINE VISCOUS 2 % MT SOLN
OROMUCOSAL | Status: AC
  Filled 2016-01-30: qty 15

## 2016-01-30 NOTE — Interval H&P Note (Signed)
History and Physical Interval Note:  01/30/2016 12:20 PM  [name]  has presented today for surgery, with the diagnosis of epigastric pain, nausea, weight loss, lower abd pain, diarrhea, rectal bleeding  The various methods of treatment have been discussed with the patient and family. After consideration of risks, benefits and other options for treatment, the patient has consented to  Procedure(s) with comments: COLONOSCOPY (N/A) - 1215  ESOPHAGOGASTRODUODENOSCOPY (EGD) (N/A) as a surgical intervention .  The patient's history has been reviewed, patient examined, no change in status, stable for surgery.  I have reviewed the patient's chart and labs.  Questions were answered to the patient's satisfaction.         No change. Persisting epigastric pain. No dysphagia. No diarrhea. Paper hematochezia. EGD and colonoscopy per plan.  The risks, benefits, limitations, imponderables and alternatives regarding both EGD and colonoscopy have been reviewed with the patient. Questions have been answered. All parties agreeable.

## 2016-01-30 NOTE — H&P (View-Only) (Signed)
Primary Care Physician: Neale Burly, MD  Primary Gastroenterologist:  Garfield Cornea, MD   Chief Complaint  Patient presents with  . Abdominal Pain    HPI: [name] is a 55 y.o. female here for further evaluation of abdominal pain. Last seen in 03/2011. She has a history of subtotal colectomy for complicated diverticulitis back in 2004. She also has h/o chronic abdominal pain, IBS, GERD, cervical esophageal web, abnormal LFTs with liver bx (don't have records at this time).   History of rectal ulcers on colonoscopy in December 2005 by Dr. Irving Shows. In April 2010 she underwent a colonoscopy by Dr. Gala Romney showing a normal rectum, subtotal colectomy. Surgical anastomosis to the small bowel at 30 cm. She had anastomotic ulcers and neoterminal ileal ulcers. Biopsies suggestive of Crohn's. She failed to respond Entocort. She had a small bowel Givens in December 2010 that showed subtly abnormal terminal ileum with erythema and edema. She was evaluated by Dr. Koleen Distance at [facility] (who also performed a colonoscopy and reportedly found no ileal ulcers) and was felt that she did not have IBD.  Patient reports that she been doing okay prior to January. She started doing some heavy cleaning around the house. 34 days later she developed recurrent abdominal pain. She felt like maybe she into herself. The cause of her multiple abdominal surgery she became concerned and went to the emergency department for evaluation. CT on January 1 with sigmoid colon mild wall thickening versus intraluminal contents. She reports being treated for colitis with antibiotic. Symptoms did not improve so she went back to the emergency department and had a second CT scan on January 18. Noted to have fatty liver. Probable avascular necrosis of the right femoral head. Otherwise unremarkable.  She reports 10 pound weight loss. Cannot eat anything but soup. Anything heavier causes abdominal pain in the lower abdomen. Had  baseline tends to have multiple stools daily. Stools may be somewhat more loose even with the addition of Colestid by Dr. Scarlette Ar. Also on Bentyl. 3-4 loose stools daily. Some bright red blood per rectum she felt was related to hemorrhoids. No melena. Some nausea without vomiting. Heartburn well-controlled. No dysphagia. Nexium no longer covered by insurance. She is taking Zantac. Naproxen is on her medication list. She states she's not taken them. Denies any NSAIDs or aspirin. Denies hip pain. She was unaware of CT findings of her right femoral head. Discussed with patient and encouraged follow-up with orthopedic.   Current Outpatient Prescriptions  Medication Sig Dispense Refill  . colestipol (COLESTID) 1 g tablet Take 1 g by mouth 3 (three) times daily.    Marland Kitchen dicyclomine (BENTYL) 10 MG capsule Take 10 mg by mouth 4 (four) times daily -  before meals and at bedtime.      . gabapentin (NEURONTIN) 300 MG capsule Take 300 mg by mouth 2 (two) times daily.      Marland Kitchen imipramine (TOFRANIL) 10 MG tablet Take 40 mg by mouth at bedtime.      Marland Kitchen levothyroxine (SYNTHROID, LEVOTHROID) 50 MCG tablet Take 50 mcg by mouth daily before breakfast.    . metFORMIN (GLUCOPHAGE) 500 MG tablet Take 500 mg by mouth daily.    . Multiple Vitamins-Calcium (ONE-A-DAY WOMENS FORMULA PO) Take 1 tablet by mouth daily. Alternating each week with Caltrate w/D     . NAPROXEN PO Take 50 mg by mouth as needed.    . Omega-3 Fatty Acids (FISH OIL) 1200 MG CAPS Take 1  capsule by mouth 2 (two) times daily.    . QUEtiapine (SEROQUEL) 50 MG tablet Take 50 mg by mouth at bedtime.      . ranitidine (ZANTAC) 300 MG tablet Take 300 mg by mouth at bedtime.    Marland Kitchen spironolactone (ALDACTONE) 50 MG tablet Take 50 mg by mouth 2 (two) times daily.      No current facility-administered medications for this visit.    Allergies as of 01/15/2016 - Review Complete 01/15/2016  Allergen Reaction Noted  . Hydromorphone hcl Itching    Past Medical History    Diagnosis Date  . HTN (hypertension)   . GERD (gastroesophageal reflux disease)   . Obesity   . Anxiety   . Depression   . IBS (irritable bowel syndrome)   . Migraine   . Chronic back pain   . Neck pain, chronic   . Diverticulitis     complicated by abscess requiring total colectomy and take-down of colostomy in 2004  . Asthma   . Terminal ileitis of small intestine ([place])     referred to [facility], Crohn's ruled out  . [facility] spotted fever   . Thyroid disease    Past Surgical History  Procedure Laterality Date  . Subtotal colectomy  2004    diverticulitis with abscess  . Hysterectomy  2009    partial  . Esophagogastroduodenoscopy  02/2009    mild erosive reflux esophagitis  . Colonoscopy  03/2009    anastomotic ulcers and neoterminal ileum ulcers, anastomosis with SB at 30cm. bx s/o Crohn's  . Esophagogastroduodenoscopy  07/2010    probable cervical esophageal web s/p dilation  . Umbilical hernia repair    . Cholecystectomy  2004    at time of colectomy  . Liver biopsy  2004    steatosis, at time of colectomy  . Appendectomy    . Ethmoid mucocele, turbinate surgery  2002    right  . Colonoscopy  2005    Dr. Irving Shows: rectal ulcers  . Givens capsule study  11/2009    Dr. Gala Romney: subtle erytherma and edema of TI  . Colonoscopy  2011    [facility]   Family History  Problem Relation Age of Onset  . ALS Father   . Crohn's disease Daughter   . Diabetes Mother   . Rheum arthritis Mother    Social History   Social History  . Marital Status: Married    Spouse Name: N/A  . Number of Children: 2  . Years of Education: N/A   Occupational History  .     Social History Main Topics  . Smoking status: Former Research scientist (life sciences)  . Smokeless tobacco: Never Used  . Alcohol Use: 0.0 oz/week    0 Standard drinks or equivalent per week  . Drug Use: No  . Sexual Activity: Yes    Birth Control/ Protection: Surgical   Other Topics Concern  . None   Social History Narrative      ROS:  General: see hpi ENT: Negative for hoarseness, difficulty swallowing , nasal congestion. CV: Negative for chest pain, angina, palpitations, dyspnea on exertion, peripheral edema.  Respiratory: Negative for dyspnea at rest, dyspnea on exertion, cough, sputum, wheezing.  GI: See history of present illness. GU:  Negative for dysuria, hematuria, urinary incontinence, urinary frequency, nocturnal urination.  Endo: see hpi   Physical Examination:   BP 120/89 mmHg  Pulse 76  Temp(Src) 97.6 F (36.4 C)  Ht 5\' 3"  (1.6 m)  Wt 184 lb  3.2 oz (83.553 kg)  BMI 32.64 kg/m2  General: Well-nourished, well-developed in no acute distress.  Eyes: No icterus. Mouth: Oropharyngeal mucosa moist and pink , no lesions erythema or exudate. Lungs: Clear to auscultation bilaterally.  Heart: Regular rate and rhythm, no murmurs rubs or gallops.  Abdomen: Bowel sounds are normal, moderate epigastric tenderness, moderate suprapubic tenderness, nondistended, no hepatosplenomegaly or masses, no abdominal bruits or hernia , no rebound or guarding.   Extremities: No lower extremity edema. No clubbing or deformities. Neuro: Alert and oriented x 4   Skin: Warm and dry, no jaundice.   Psych: Alert and cooperative, normal mood and affect.  Labs:  Lab Results  Component Value Date   CREATININE 1.22* 01/03/2016   BUN 11 01/03/2016   NA 141 01/03/2016   K 4.7 01/03/2016   CL 104 01/03/2016   CO2 26 01/03/2016   Lab Results  Component Value Date   ALT 17 01/03/2016   AST 23 01/03/2016   ALKPHOS 52 01/03/2016   BILITOT 0.3 01/03/2016   Lab Results  Component Value Date   WBC 9.0 01/03/2016   HGB 15.1* 01/03/2016   HCT 44.3 01/03/2016   MCV 92.5 01/03/2016   PLT 250 01/03/2016   Lab Results  Component Value Date   LIPASE 32 01/03/2016    Imaging Studies: Ct Abdomen Pelvis W Contrast  01/03/2016  CLINICAL DATA:  55 year old female with constant moderate suprapubic and left lower  quadrant abdominal pain for the past 2 weeks, progressively worsening. Difficulty eating without developing abdominal pain. EXAM: CT ABDOMEN AND PELVIS WITH CONTRAST TECHNIQUE: Multidetector CT imaging of the abdomen and pelvis was performed using the standard protocol following bolus administration of intravenous contrast. CONTRAST:  156mL OMNIPAQUE IOHEXOL 300 MG/ML  SOLN COMPARISON:  CT the abdomen and pelvis 12/17/2015. FINDINGS: Lower chest:  Unremarkable. Hepatobiliary: No definite cystic or solid hepatic lesions. No intra or extrahepatic biliary ductal dilatation. Mild diffuse decreased attenuation throughout the hepatic parenchyma, compatible with hepatic steatosis. Status post cholecystectomy. Pancreas: No pancreatic mass. No pancreatic ductal dilatation. No pancreatic or peripancreatic fluid or inflammatory changes. Spleen: Unremarkable. Adrenals/Urinary Tract: Left kidney and bilateral adrenal glands are normal in appearance. Sub cm low-attenuation lesion in the lateral aspect of the interpolar region of the right kidney is too small to definitively characterize, but is similar to the prior study, likely a tiny cyst. No hydroureteronephrosis. Urinary bladder is normal in appearance. Stomach/Bowel: Normal appearance of the stomach. Postoperative changes of subtotal colectomy are noted. Side-to-side ileocolic anastomosis noted. Vascular/Lymphatic: No significant atherosclerotic disease, aneurysm or dissection identified in the abdominal or pelvic vasculature. No lymphadenopathy noted in the abdomen or pelvis. Reproductive: Uterus and ovaries are unremarkable in appearance. Other: No significant volume of ascites.  No pneumoperitoneum. Musculoskeletal: Extensive sclerosis in the right femoral head, suggesting areas of avascular necrosis. There are no other aggressive appearing lytic or blastic lesions noted in the visualized portions of the skeleton. IMPRESSION: 1. No acute findings in the abdomen or pelvis  to account for the patient's symptoms. 2. Hepatic steatosis. 3. Postoperative changes, as above. 4. Probable avascular necrosis of the right femoral head. Electronically Signed   By: Vinnie Langton M.D.   On: 01/03/2016 22:01   Ct Abdomen Pelvis W Contrast  12/17/2015  CLINICAL DATA:  Acute onset of bilateral lower quadrant abdominal pain. Initial encounter. EXAM: CT ABDOMEN AND PELVIS WITH CONTRAST TECHNIQUE: Multidetector CT imaging of the abdomen and pelvis was performed using the standard protocol following bolus administration  of intravenous contrast. CONTRAST:  116mL OMNIPAQUE IOHEXOL 300 MG/ML  SOLN COMPARISON:  None. FINDINGS: The visualized lung bases are clear. The liver and spleen are unremarkable in appearance. The patient is status post cholecystectomy, with clips noted at the gallbladder fossa. The pancreas and adrenal glands are unremarkable. A 6 mm cyst is noted in the periphery of the right kidney. The kidneys are otherwise unremarkable. There is no evidence of hydronephrosis. No renal or ureteral stones are seen. No perinephric stranding is appreciated. No free fluid is identified. The small bowel is unremarkable in appearance. The stomach is within normal limits. No acute vascular abnormalities are seen. The patient is status post resection of much of the colon. Associated anastomoses are grossly unremarkable. The remaining sigmoid colon demonstrates mild wall thickening, but this is thought reflect intraluminal contents. The bladder is mildly distended and grossly unremarkable. The patient is status post hysterectomy. No suspicious adnexal masses are seen. No inguinal lymphadenopathy is seen. No acute osseous abnormalities are identified. IMPRESSION: 1. No acute abnormality seen within the abdomen or pelvis. 2. Mild apparent wall thickening at the sigmoid colon is thought to reflect intraluminal contents, though depending on the degree of clinical concern, colonoscopy could be considered for  further evaluation. 3. Small right renal cyst noted. Electronically Signed   By: Garald Balding M.D.   On: 12/17/2015 06:35    Impression/plan  55 year old female who presents for further evaluation of recurrent abdominal pain, worsening bowel function. Extensive evaluation in the past. She is status post subtotal colectomy for complicated diverticulitis around 2004. History of anastomotic ulcers in neoterminal ileal ulcers at time of colonoscopy in 2010, pathology suggestive of Crohn's. She fell response to Entocort. Subsequently had a small bowel capsule endoscopy with subtle abnormality with erythema and edema of the terminal ileum. Second opinion with Dr. Koleen Distance at [facility], repeat colonoscopy with reportedly no ileal ulceration and Crohn's/IBD not suspected.  Now with recurrent abdominal pain, initial CT with questionable wall thickening of the sigmoid colon filling to respond to antibiotic therapy. Looser stools. Given previous history recommend ileocolonoscopy for further evaluation. She has quite significant epigastric tenderness associated with nausea as well. Upper endoscopy at time of ileocolonoscopy planned.  I have discussed the risks, alternatives, benefits with regards to but not limited to the risk of reaction to medication, bleeding, infection, perforation and the patient is agreeable to proceed. Written consent to be obtained.  Augment conscious sedation with Phenergan 25 mg IV 30 minutes before the procedure. Patient previously did well with conscious sedation.  Anusol cream applied rectally twice a day for hemorrhoids. Patient would like to be evaluated for possible [facility] hemorrhoid banding.

## 2016-01-30 NOTE — Op Note (Signed)
[facility] [address] [place], [address]   ENDOSCOPY PROCEDURE REPORT  PATIENT: Tempie, Delapuente  MR#: [id number] BIRTHDATE: 1961-06-30 , 18  yrs. old GENDER: female ENDOSCOPIST: R.  Garfield Cornea, MD FACP FACG REFERRED BY:  Nadeen Landau, M.D. PROCEDURE DATE:  08-Feb-2016 PROCEDURE:  EGD, diagnostic INDICATIONS:  epigastric pain. MEDICATIONS: Versed 5 mg IV and Demerol 100 mg IV in divided doses. Xylocaine gel orally.  Zofran 4 mg IV.  Phenergan 25 mg IV. ASA CLASS:      Class II  CONSENT: The risks, benefits, limitations, alternatives and imponderables have been discussed.  The potential for biopsy, esophogeal dilation, etc. have also been reviewed.  Questions have been answered.  All parties agreeable.  Please see the history and physical in the medical record for more information.  DESCRIPTION OF PROCEDURE: After the risks benefits and alternatives of the procedure were thoroughly explained, informed consent was obtained.  The [id number] endoscope was introduced through the mouth and advanced to the second portion of the duodenum , limited by Without limitations. The instrument was slowly withdrawn as the mucosa was fully examined. Estimated blood loss is zero unless otherwise noted in this procedure report.    Normal-appearing, patent tubular esophagus.  Stomach empty.  2 cm hiatal hernia.  Normal-appearing gastric mucosa.  Patent pylorus. Normal-appearing first and second portion of the duodenum. Retroflexed views revealed a hiatal hernia.     The scope was then withdrawn from the patient and the procedure completed.  COMPLICATIONS: There were no immediate complications.  ENDOSCOPIC IMPRESSION: hiatal hernia only  RECOMMENDATIONS: See colonoscopy report.  REPEAT EXAM:  eSigned:  R. Garfield Cornea, MD Rosalita Chessman [facility] 02/08/16 12:43 PM    CC:  CPT CODES: ICD CODES:  The ICD and CPT codes recommended by this software  are interpretations from the data that the clinical staff has captured with the software.  The verification of the translation of this report to the ICD and CPT codes and modifiers is the sole responsibility of the health care institution and practicing physician where this report was generated.  [employer]. will not be held responsible for the validity of the ICD and CPT codes included on this report.  AMA assumes no liability for data contained or not contained herein. CPT is a Designer, television/film set of the [employer].  PATIENT NAME:  Voula, Vilar MR#: [id number]

## 2016-01-30 NOTE — Op Note (Signed)
[facility] [address] [place], [address]   COLONOSCOPY PROCEDURE REPORT  PATIENT: Stacey, Gill  MR#: [id number] BIRTHDATE: [birth year] , 63  yrs. old GENDER: female ENDOSCOPIST: R.  Garfield Cornea, MD FACP [facility] REFERRED [id number] Hasani, M.D. PROCEDURE DATE:  2016-02-09 PROCEDURE:   Colonoscopy with biopsy INDICATIONS:paper hematochezia; history of abnormal colon on CT. MEDICATIONS: Versed 6 mg IV and Demerol 100 mg IV in divided doses. Phenergan 25 mg IV Zofran 4 mg IV. ASA CLASS:       Class II  CONSENT: The risks, benefits, alternatives and imponderables including but not limited to bleeding, perforation as well as the possibility of a missed lesion have been reviewed.  The potential for biopsy, lesion removal, etc. have also been discussed. Questions have been answered.  All parties agreeable.  Please see the history and physical in the medical record for more information.  DESCRIPTION OF PROCEDURE:   After the risks benefits and alternatives of the procedure were thoroughly explained, informed consent was obtained.  The digital rectal exam revealed no rectal mass.   The [id number]  endoscope was introduced through the anus and advanced to the surgical anastomosis. No adverse events experienced.   The quality of the prep was adequate  The instrument was then slowly withdrawn as the colon was fully examined. Estimated blood loss is zero unless otherwise noted in this procedure report.      COLON FINDINGS: Internal hemorrhoids present.  (1) diminutive polyp just inside the anal verge; otherwise, the remainder of the rectal mucosa appeared normal.  The entire residual colonic mucosa was seen to the anastomosis with small bowel at 30 cm.  The residual colonic mucosa appeared normal.  The distal 10 cm of neoterminal ileum mucosa was surveyed in this segment also appeared normal. There were some protruding staples and suture material at  the anastomosis.  Retroflexion was performed. .  Withdrawal time=not applicable     .  The scope was withdrawn and the procedure completed. COMPLICATIONS: There were no immediate complications. EBL 3 mL ENDOSCOPIC IMPRESSION: Internal hemorrhoids?"likely source of hematochezia. Rectal polyp?"removed as described above status post subtotal colectomy with a normal-appearing residual colonic and small bowel mucosa . Follow up on pathology.  RECOMMENDATIONS: Course of Carafate suspension (see EGD report) .    Office visit for hemorrhoid banding in 4 weeks  eSigned:  R. Garfield Cornea, MD Rosalita Chessman [facility] 09-Feb-2016 1:01 PM   cc:  CPT CODES: ICD CODES:  The ICD and CPT codes recommended by this software are interpretations from the data that the clinical staff has captured with the software.  The verification of the translation of this report to the ICD and CPT codes and modifiers is the sole responsibility of the health care institution and practicing physician where this report was generated.  [place]. will not be held responsible for the validity of the ICD and CPT codes included on this report.  AMA assumes no liability for data contained or not contained herein. CPT is a Designer, television/film set of the [employer].

## 2016-01-30 NOTE — Discharge Instructions (Signed)
Colonoscopy Discharge Instructions  Read the instructions outlined below and refer to this sheet in the next few weeks. These discharge instructions provide you with general information on caring for yourself after you leave the hospital. Your doctor may also give you specific instructions. While your treatment has been planned according to the most current medical practices available, unavoidable complications occasionally occur. If you have any problems or questions after discharge, call Dr. Gala Romney at [phone]. ACTIVITY  You may resume your regular activity, but move at a slower pace for the next 24 hours.   Take frequent rest periods for the next 24 hours.   Walking will help get rid of the air and reduce the bloated feeling in your belly (abdomen).   No driving for 24 hours (because of the medicine (anesthesia) used during the test).    Do not sign any important legal documents or operate any machinery for 24 hours (because of the anesthesia used during the test).  NUTRITION  Drink plenty of fluids.   You may resume your normal diet as instructed by your doctor.   Begin with a light meal and progress to your normal diet. Heavy or fried foods are harder to digest and may make you feel sick to your stomach (nauseated).   Avoid alcoholic beverages for 24 hours or as instructed.  MEDICATIONS  You may resume your normal medications unless your doctor tells you otherwise.  WHAT YOU CAN EXPECT TODAY  Some feelings of bloating in the abdomen.   Passage of more gas than usual.   Spotting of blood in your stool or on the toilet paper.  IF YOU HAD POLYPS REMOVED DURING THE COLONOSCOPY:  No aspirin products for 7 days or as instructed.   No alcohol for 7 days or as instructed.   Eat a soft diet for the next 24 hours.  FINDING OUT THE RESULTS OF YOUR TEST Not all test results are available during your visit. If your test results are not back during the visit, make an appointment  with your caregiver to find out the results. Do not assume everything is normal if you have not heard from your caregiver or the medical facility. It is important for you to follow up on all of your test results.  SEEK IMMEDIATE MEDICAL ATTENTION IF:  You have more than a spotting of blood in your stool.   Your belly is swollen (abdominal distention).   You are nauseated or vomiting.   You have a temperature over 101.   You have abdominal pain or discomfort that is severe or gets worse throughout the day.   EGD Discharge instructions Please read the instructions outlined below and refer to this sheet in the next few weeks. These discharge instructions provide you with general information on caring for yourself after you leave the hospital. Your doctor may also give you specific instructions. While your treatment has been planned according to the most current medical practices available, unavoidable complications occasionally occur. If you have any problems or questions after discharge, please call your doctor. ACTIVITY  You may resume your regular activity but move at a slower pace for the next 24 hours.   Take frequent rest periods for the next 24 hours.   Walking will help expel (get rid of) the air and reduce the bloated feeling in your abdomen.   No driving for 24 hours (because of the anesthesia (medicine) used during the test).   You may shower.   Do not sign any  important legal documents or operate any machinery for 24 hours (because of the anesthesia used during the test).  NUTRITION  Drink plenty of fluids.   You may resume your normal diet.   Begin with a light meal and progress to your normal diet.   Avoid alcoholic beverages for 24 hours or as instructed by your caregiver.  MEDICATIONS  You may resume your normal medications unless your caregiver tells you otherwise.  WHAT YOU CAN EXPECT TODAY  You may experience abdominal discomfort such as a feeling of  fullness or gas pains.  FOLLOW-UP  Your doctor will discuss the results of your test with you.  SEEK IMMEDIATE MEDICAL ATTENTION IF ANY OF THE FOLLOWING OCCUR:  Excessive nausea (feeling sick to your stomach) and/or vomiting.   Severe abdominal pain and distention (swelling).   Trouble swallowing.   Temperature over 101 F (37.8 C).   Rectal bleeding or vomiting of blood.   Course of Carafate suspension as directed  Hemorrhoid information provided  Hemorrhoid banding information provided  Office visit with [place] in 3-4 weeks for hemorrhoid banding  Further recommendations to follow pending review of pathology report    Hemorrhoids Hemorrhoids are swollen veins around the rectum or anus. There are two types of hemorrhoids:   Internal hemorrhoids. These occur in the veins just inside the rectum. They may poke through to the outside and become irritated and painful.  External hemorrhoids. These occur in the veins outside the anus and can be felt as a painful swelling or hard lump near the anus. CAUSES  Pregnancy.   Obesity.   Constipation or diarrhea.   Straining to have a bowel movement.   Sitting for long periods on the toilet.  Heavy lifting or other activity that caused you to strain.  Anal intercourse. SYMPTOMS   Pain.   Anal itching or irritation.   Rectal bleeding.   Fecal leakage.   Anal swelling.   One or more lumps around the anus.  DIAGNOSIS  Your caregiver may be able to diagnose hemorrhoids by visual examination. Other examinations or tests that may be performed include:   Examination of the rectal area with a gloved hand (digital rectal exam).   Examination of anal canal using a small tube (scope).   A blood test if you have lost a significant amount of blood.  A test to look inside the colon (sigmoidoscopy or colonoscopy). TREATMENT Most hemorrhoids can be treated at home. However, if symptoms do not seem to be getting  better or if you have a lot of rectal bleeding, your caregiver may perform a procedure to help make the hemorrhoids get smaller or remove them completely. Possible treatments include:   Placing a rubber band at the base of the hemorrhoid to cut off the circulation (rubber band ligation).   Injecting a chemical to shrink the hemorrhoid (sclerotherapy).   Using a tool to burn the hemorrhoid (infrared light therapy).   Surgically removing the hemorrhoid (hemorrhoidectomy).   Stapling the hemorrhoid to block blood flow to the tissue (hemorrhoid stapling).  HOME CARE INSTRUCTIONS   Eat foods with fiber, such as whole grains, beans, nuts, fruits, and vegetables. Ask your doctor about taking products with added fiber in them (fibersupplements).  Increase fluid intake. Drink enough water and fluids to keep your urine clear or pale yellow.   Exercise regularly.   Go to the bathroom when you have the urge to have a bowel movement. Do not wait.   Avoid straining to  have bowel movements.   Keep the anal area dry and clean. Use wet toilet paper or moist towelettes after a bowel movement.   Medicated creams and suppositories may be used or applied as directed.   Only take over-the-counter or prescription medicines as directed by your caregiver.   Take warm sitz baths for 15-20 minutes, 3-4 times a day to ease pain and discomfort.   Place ice packs on the hemorrhoids if they are tender and swollen. Using ice packs between sitz baths may be helpful.   Put ice in a plastic bag.   Place a towel between your skin and the bag.   Leave the ice on for 15-20 minutes, 3-4 times a day.   Do not use a donut-shaped pillow or sit on the toilet for long periods. This increases blood pooling and pain.  SEEK MEDICAL CARE IF:  You have increasing pain and swelling that is not controlled by treatment or medicine.  You have uncontrolled bleeding.  You have difficulty or you are unable  to have a bowel movement.  You have pain or inflammation outside the area of the hemorrhoids. MAKE SURE YOU:  Understand these instructions.  Will watch your condition.  Will get help right away if you are not doing well or get worse.   This information is not intended to replace advice given to you by your health care provider. Make sure you discuss any questions you have with your health care provider.   Document Released: 11/29/2000 Document Revised: 11/18/2012 Document Reviewed: 10/06/2012 Elsevier Interactive Patient Education [employer].

## 2016-02-01 ENCOUNTER — Encounter (HOSPITAL_COMMUNITY): Payer: Self-pay | Admitting: Internal Medicine

## 2016-02-01 ENCOUNTER — Encounter: Payer: Self-pay | Admitting: Internal Medicine

## 2016-02-20 ENCOUNTER — Encounter: Payer: Self-pay | Admitting: Internal Medicine

## 2016-02-20 ENCOUNTER — Ambulatory Visit (INDEPENDENT_AMBULATORY_CARE_PROVIDER_SITE_OTHER): Payer: Medicare HMO | Admitting: Internal Medicine

## 2016-02-20 VITALS — BP 107/76 | HR 83 | Temp 98.2°F | Ht 63.0 in | Wt 182.6 lb

## 2016-02-20 DIAGNOSIS — K648 Other hemorrhoids: Secondary | ICD-10-CM

## 2016-02-20 NOTE — Progress Notes (Signed)
[place] banding procedure note:  The patient presents with symptomatic hemorrhoids, unresponsive to maximal medical therapy, requesting rubber band ligation of her hemorrhoidal disease. All risks, benefits, and alternative forms of therapy were described and informed consent was obtained.  Recent colonoscopy as noted  In the left lateral decubitus position, a DRE revealed no abnormalities. Subsequently, anoscopy demonstrated a prominent left lateral hemorrhoid band;    The decision was made to band the left lateral internal hemorrhoid;the [place] was used to perform band ligation without complication. Digital anorectal examination was then performed to assure proper positioning of the band;  First band barely in place. I elected to deploy a second day and in the same location. [place]-wup DRE demonstrated Band to be excellent position. No pinching or pain; The patient was discharged home without pain or other issues. Dietary and behavioral recommendations were given along with follow-up instructions. The patient will return in 4 weeks for followup and possible additional banding as required.  No complications were encountered and the patient tolerated the procedure well.

## 2016-02-20 NOTE — Patient Instructions (Signed)
Avoid straining.  Benefiber 1 tablespoon twice daily   Limit toilet time to 2-5 minutes  Call with any interim problems  Schedule followup appointment in 4 weeks

## 2016-03-26 ENCOUNTER — Ambulatory Visit (INDEPENDENT_AMBULATORY_CARE_PROVIDER_SITE_OTHER): Payer: Medicare HMO | Admitting: Internal Medicine

## 2016-03-26 ENCOUNTER — Encounter: Payer: Self-pay | Admitting: Internal Medicine

## 2016-03-26 VITALS — BP 108/80 | HR 77 | Temp 97.1°F | Ht 63.0 in | Wt 180.2 lb

## 2016-03-26 DIAGNOSIS — K641 Second degree hemorrhoids: Secondary | ICD-10-CM

## 2016-03-26 DIAGNOSIS — K642 Third degree hemorrhoids: Secondary | ICD-10-CM | POA: Diagnosis not present

## 2016-03-26 DIAGNOSIS — K648 Other hemorrhoids: Secondary | ICD-10-CM

## 2016-03-26 NOTE — Progress Notes (Signed)
[place] banding procedure note:  The patient presents with symptomatic grade 2/3 hemorrhoids; status post banding of left lateral hemorrhoid column recently. Now, she's had global improvement in her hemorrhoid symptoms. She has noticed a blue suture material past recently. unresponsive to maximal medical therapy, requesting rubber band ligation of his/her hemorrhoidal disease. All risks, benefits, and alternative forms of therapy were described and informed consent was obtained. History of subtotal colectomy for complicated diverticulitis previously. A look back in the last colonoscopy report revealed redundant blue suture material clearly seen protruding from the anastomosis at 30 cm.  In the left lateral decubitus position, a digital rectal exam utilizing a pea-sized amount of 0.125% nitroglycerin and 2% Xylocaine jelly, revealed a small anal papilla versus small thrombosed hemorrhoid at 6:00 position. No masses. Minimally tender. The decision was made to band the right anterior internal hemorrhoid;  the [place] was used to perform band ligation without complication. Digital anorectal examination was then performed to assure proper positioning of the band and to adjust the banded tissue as required. The patient was discharged home without pain or other issues. Dietary and behavioral recommendations were given. C. Follow-up instructions. Office visit in 4 weeks for third band placement as appropriate..  No complications were encountered and the patient tolerated the procedure well.

## 2016-03-26 NOTE — Patient Instructions (Signed)
Avoid straining.  Benefiber 1 tablespoon twice daily  Limit toilet time to 2-5 minutes  Call with any interim problems  Schedule followup appointment in 4 weeks from now

## 2016-04-26 ENCOUNTER — Ambulatory Visit (INDEPENDENT_AMBULATORY_CARE_PROVIDER_SITE_OTHER): Payer: Medicare HMO | Admitting: Internal Medicine

## 2016-04-26 ENCOUNTER — Encounter: Payer: Self-pay | Admitting: Internal Medicine

## 2016-04-26 VITALS — BP 104/74 | HR 88 | Temp 97.6°F | Ht 63.0 in | Wt 178.4 lb

## 2016-04-26 DIAGNOSIS — K648 Other hemorrhoids: Secondary | ICD-10-CM

## 2016-04-26 DIAGNOSIS — K219 Gastro-esophageal reflux disease without esophagitis: Secondary | ICD-10-CM

## 2016-04-26 MED ORDER — ESOMEPRAZOLE MAGNESIUM 20 MG PO CPDR: 20.0000 mg | DELAYED_RELEASE_CAPSULE | Freq: Two times a day (BID) | ORAL | Status: DC

## 2016-04-26 NOTE — Patient Instructions (Signed)
Avoid straining.  Benefiber 1 tablespoon twice daily  Limit toilet time to 5 minutes  GERD information provided  Take esomeprazole 20 mg twice daily for reflux  Office visit with [place] in 3 months  Call with any interim problems        Call with any interim problems  Schedule followup appointment in 2-3 weeks from now

## 2016-04-26 NOTE — Progress Notes (Addendum)
[place] banding procedure note:  The patient presents with symptomatic grade 2/3 hemorrhoids; status post banding right anterior and left lateral columns. She states she is "100%" improved. She has done great;  All of her hemorrhoid symptoms have settled down. She wishes to have her been placed today.  Also, reflux symptoms acting up on S. Omeprazole 20 mg daily. Has multiple breakthrough episodes monthly. Mainly at night. No dysphagia melena nausea vomiting early satiety.  Band ligation to be performed today.  All risks, benefits, and alternative forms of therapy were described and informed consent was obtained.  In the left lateral decubitus position, a DRE revealed no abnormalities.  The decision was made to band the right posterior internal hemorrhoid;  the [place] was used to perform band ligation without complication. Digital anorectal examination was then performed to assure proper positioning of the band; Band down to be in excellent position. No pinching or pain.The patient was discharged home without pain or other issues. Dietary and behavioral recommendations were given and along with follow-up instructions. I've asked patient to increase her esomeprazole 20 mg twice daily. We will call a prescription in.  No complications were encountered and the patient tolerated the procedure well.  Will see her back in the office for a follow-up appointment in 3 months.    Addendum: About 5 minutes after bands placed patient started having some pain in the area. I went back and performed a digital rectal exam. I lubricated the anal canal with a pea-sized amount of 2% Xylocaine and 0.125% nitroglycerin topical. I loosened  the band up slightly with relief of patient's discomfort. She'll be monitored for another 10 minutes prior to discharge.

## 2016-07-30 ENCOUNTER — Ambulatory Visit (INDEPENDENT_AMBULATORY_CARE_PROVIDER_SITE_OTHER): Payer: Medicare HMO | Admitting: Gastroenterology

## 2016-07-30 ENCOUNTER — Encounter: Payer: Self-pay | Admitting: Gastroenterology

## 2016-07-30 VITALS — BP 109/76 | HR 80 | Temp 97.4°F | Ht 63.0 in | Wt 185.2 lb

## 2016-07-30 DIAGNOSIS — K648 Other hemorrhoids: Secondary | ICD-10-CM

## 2016-07-30 NOTE — Progress Notes (Signed)
cc'ed to pcp °

## 2016-07-30 NOTE — Progress Notes (Signed)
Primary Care Physician: Neale Burly, MD  Primary Gastroenterologist:  Garfield Cornea, MD   Chief Complaint  Patient presents with  . Follow-up    doing ok    HPI: [name] is a 55 y.o. female here for follow-up of hemorrhoids. She had an EGD and colonoscopy back in February which showed internal hemorrhoids, rectal polyps are hyperplastic, status post subtotal colectomy with normal-appearing residual colonic and small bowel mucosa. EGD was unremarkable except for hiatal hernia. Patient has underwent hemorrhoid banding 3.Doing remarkably well. No GI concerns. No further hemorrhoid issues.. Pleased with hemorrhoid banding success.  Current Outpatient Prescriptions  Medication Sig Dispense Refill  . colestipol (COLESTID) 1 g tablet Take 1 g by mouth 3 (three) times daily.    Marland Kitchen dicyclomine (BENTYL) 10 MG capsule Take 10 mg by mouth 4 (four) times daily -  before meals and at bedtime.      Marland Kitchen esomeprazole (NEXIUM) 20 MG capsule Take 1 capsule (20 mg total) by mouth 2 (two) times daily before a meal. 180 capsule 3  . gabapentin (NEURONTIN) 300 MG capsule Take 300 mg by mouth 2 (two) times daily.      Marland Kitchen imipramine (TOFRANIL) 10 MG tablet Take 40 mg by mouth at bedtime.      Marland Kitchen levothyroxine (SYNTHROID, LEVOTHROID) 25 MCG tablet Take 25 mcg by mouth daily.    . metFORMIN (GLUCOPHAGE) 500 MG tablet Take 500 mg by mouth daily.    . Multiple Vitamins-Calcium (ONE-A-DAY WOMENS FORMULA PO) Take 1 tablet by mouth daily. Alternating each week with Caltrate w/D     . NAPROXEN PO Take 50 mg by mouth as needed.    . Omega-3 Fatty Acids (FISH OIL) 1200 MG CAPS Take 1 capsule by mouth 2 (two) times daily.    Marland Kitchen omeprazole (PRILOSEC) 20 MG capsule Take 1 capsule (20 mg total) by mouth daily. 30 capsule 1  . QUEtiapine (SEROQUEL) 50 MG tablet Take 50 mg by mouth at bedtime.      . ranitidine (ZANTAC) 300 MG tablet Take 300 mg by mouth at bedtime.    . simvastatin (ZOCOR) 10 MG tablet Take 10  mg by mouth daily.    Marland Kitchen spironolactone (ALDACTONE) 50 MG tablet Take 50 mg by mouth 2 (two) times daily.      No current facility-administered medications for this visit.     Allergies as of 07/30/2016 - Review Complete 07/30/2016  Allergen Reaction Noted  . Hydromorphone hcl Itching     ROS:  General: Negative for anorexia, weight loss, fever, chills, fatigue, weakness. ENT: Negative for hoarseness, difficulty swallowing , nasal congestion. CV: Negative for chest pain, angina, palpitations, dyspnea on exertion, peripheral edema.  Respiratory: Negative for dyspnea at rest, dyspnea on exertion, cough, sputum, wheezing.  GI: See history of present illness. GU:  Negative for dysuria, hematuria, urinary incontinence, urinary frequency, nocturnal urination.  Endo: Negative for unusual weight change.    Physical Examination:   BP 109/76   Pulse 80   Temp 97.4 F (36.3 C) (Oral)   Ht 5\' 3"  (1.6 m)   Wt 185 lb 3.2 oz (84 kg)   BMI 32.81 kg/m   General: Well-nourished, well-developed in no acute distress.  Eyes: No icterus. Mouth: Oropharyngeal mucosa moist and pink , no lesions erythema or exudate.  Abdomen: Bowel sounds are normal, nontender, nondistended, no hepatosplenomegaly or masses, no abdominal bruits or hernia , no rebound or guarding.   Extremities: No lower  extremity edema. No clubbing or deformities. Neuro: Alert and oriented x 4   Skin: Warm and dry, no jaundice.   Psych: Alert and cooperative, normal mood and affect.  Labs:  Lab Results  Component Value Date   WBC 9.0 01/03/2016   HGB 15.1 (H) 01/03/2016   HCT 44.3 01/03/2016   MCV 92.5 01/03/2016   PLT 250 01/03/2016    Imaging Studies: No results found.

## 2016-07-30 NOTE — Patient Instructions (Signed)
1. Please call or email [place] if you have ANY questions or concerns.

## 2016-07-30 NOTE — Assessment & Plan Note (Signed)
Status post hemorrhoid banding 3. Doing remarkably well. No other GI concerns. Next colonoscopy slated for 2027. Advised against straining, excessive toileting time. Continue to keep bowel movements regular. She will call with any other questions or concerns. Office visit as needed.

## 2017-01-10 DIAGNOSIS — E784 Other hyperlipidemia: Secondary | ICD-10-CM | POA: Diagnosis not present

## 2017-01-10 DIAGNOSIS — E038 Other specified hypothyroidism: Secondary | ICD-10-CM | POA: Diagnosis not present

## 2017-01-10 DIAGNOSIS — I10 Essential (primary) hypertension: Secondary | ICD-10-CM | POA: Diagnosis not present

## 2017-01-10 DIAGNOSIS — E1165 Type 2 diabetes mellitus with hyperglycemia: Secondary | ICD-10-CM | POA: Diagnosis not present

## 2017-01-14 DIAGNOSIS — I10 Essential (primary) hypertension: Secondary | ICD-10-CM | POA: Diagnosis not present

## 2017-01-14 DIAGNOSIS — K210 Gastro-esophageal reflux disease with esophagitis: Secondary | ICD-10-CM | POA: Diagnosis not present

## 2017-01-14 DIAGNOSIS — E1165 Type 2 diabetes mellitus with hyperglycemia: Secondary | ICD-10-CM | POA: Diagnosis not present

## 2017-01-14 DIAGNOSIS — E038 Other specified hypothyroidism: Secondary | ICD-10-CM | POA: Diagnosis not present

## 2017-01-14 DIAGNOSIS — E784 Other hyperlipidemia: Secondary | ICD-10-CM | POA: Diagnosis not present

## 2017-02-04 DIAGNOSIS — E784 Other hyperlipidemia: Secondary | ICD-10-CM | POA: Diagnosis not present

## 2017-02-04 DIAGNOSIS — E1165 Type 2 diabetes mellitus with hyperglycemia: Secondary | ICD-10-CM | POA: Diagnosis not present

## 2017-02-04 DIAGNOSIS — E038 Other specified hypothyroidism: Secondary | ICD-10-CM | POA: Diagnosis not present

## 2017-02-04 DIAGNOSIS — I10 Essential (primary) hypertension: Secondary | ICD-10-CM | POA: Diagnosis not present

## 2017-02-04 DIAGNOSIS — K210 Gastro-esophageal reflux disease with esophagitis: Secondary | ICD-10-CM | POA: Diagnosis not present

## 2017-04-17 DIAGNOSIS — E784 Other hyperlipidemia: Secondary | ICD-10-CM | POA: Diagnosis not present

## 2017-04-17 DIAGNOSIS — I10 Essential (primary) hypertension: Secondary | ICD-10-CM | POA: Diagnosis not present

## 2017-04-17 DIAGNOSIS — E119 Type 2 diabetes mellitus without complications: Secondary | ICD-10-CM | POA: Diagnosis not present

## 2017-04-17 DIAGNOSIS — Z0000 Encounter for general adult medical examination without abnormal findings: Secondary | ICD-10-CM | POA: Diagnosis not present

## 2017-04-17 DIAGNOSIS — E038 Other specified hypothyroidism: Secondary | ICD-10-CM | POA: Diagnosis not present

## 2017-04-17 DIAGNOSIS — K210 Gastro-esophageal reflux disease with esophagitis: Secondary | ICD-10-CM | POA: Diagnosis not present

## 2017-04-17 DIAGNOSIS — E1165 Type 2 diabetes mellitus with hyperglycemia: Secondary | ICD-10-CM | POA: Diagnosis not present

## 2017-06-26 DIAGNOSIS — E784 Other hyperlipidemia: Secondary | ICD-10-CM | POA: Diagnosis not present

## 2017-06-26 DIAGNOSIS — E038 Other specified hypothyroidism: Secondary | ICD-10-CM | POA: Diagnosis not present

## 2017-06-26 DIAGNOSIS — E1165 Type 2 diabetes mellitus with hyperglycemia: Secondary | ICD-10-CM | POA: Diagnosis not present

## 2017-06-26 DIAGNOSIS — I10 Essential (primary) hypertension: Secondary | ICD-10-CM | POA: Diagnosis not present

## 2017-07-16 DIAGNOSIS — E784 Other hyperlipidemia: Secondary | ICD-10-CM | POA: Diagnosis not present

## 2017-07-16 DIAGNOSIS — E1165 Type 2 diabetes mellitus with hyperglycemia: Secondary | ICD-10-CM | POA: Diagnosis not present

## 2017-07-16 DIAGNOSIS — E038 Other specified hypothyroidism: Secondary | ICD-10-CM | POA: Diagnosis not present

## 2017-07-16 DIAGNOSIS — K210 Gastro-esophageal reflux disease with esophagitis: Secondary | ICD-10-CM | POA: Diagnosis not present

## 2017-07-16 DIAGNOSIS — I10 Essential (primary) hypertension: Secondary | ICD-10-CM | POA: Diagnosis not present

## 2017-10-14 DIAGNOSIS — Z1231 Encounter for screening mammogram for malignant neoplasm of breast: Secondary | ICD-10-CM | POA: Diagnosis not present

## 2017-10-21 DIAGNOSIS — I10 Essential (primary) hypertension: Secondary | ICD-10-CM | POA: Diagnosis not present

## 2017-10-21 DIAGNOSIS — K210 Gastro-esophageal reflux disease with esophagitis: Secondary | ICD-10-CM | POA: Diagnosis not present

## 2017-10-21 DIAGNOSIS — E1165 Type 2 diabetes mellitus with hyperglycemia: Secondary | ICD-10-CM | POA: Diagnosis not present

## 2017-10-21 DIAGNOSIS — E7849 Other hyperlipidemia: Secondary | ICD-10-CM | POA: Diagnosis not present

## 2017-10-21 DIAGNOSIS — E038 Other specified hypothyroidism: Secondary | ICD-10-CM | POA: Diagnosis not present

## 2017-11-05 DIAGNOSIS — N6489 Other specified disorders of breast: Secondary | ICD-10-CM | POA: Diagnosis not present

## 2017-11-05 DIAGNOSIS — R928 Other abnormal and inconclusive findings on diagnostic imaging of breast: Secondary | ICD-10-CM | POA: Diagnosis not present

## 2017-11-13 DIAGNOSIS — H52223 Regular astigmatism, bilateral: Secondary | ICD-10-CM | POA: Diagnosis not present

## 2017-11-13 DIAGNOSIS — H524 Presbyopia: Secondary | ICD-10-CM | POA: Diagnosis not present

## 2017-11-13 DIAGNOSIS — Z780 Asymptomatic menopausal state: Secondary | ICD-10-CM | POA: Diagnosis not present

## 2017-11-13 DIAGNOSIS — H16223 Keratoconjunctivitis sicca, not specified as Sjogren's, bilateral: Secondary | ICD-10-CM | POA: Diagnosis not present

## 2017-11-13 DIAGNOSIS — M8588 Other specified disorders of bone density and structure, other site: Secondary | ICD-10-CM | POA: Diagnosis not present

## 2017-11-13 DIAGNOSIS — H5212 Myopia, left eye: Secondary | ICD-10-CM | POA: Diagnosis not present

## 2017-11-13 DIAGNOSIS — M810 Age-related osteoporosis without current pathological fracture: Secondary | ICD-10-CM | POA: Diagnosis not present

## 2018-01-22 DIAGNOSIS — I10 Essential (primary) hypertension: Secondary | ICD-10-CM | POA: Diagnosis not present

## 2018-01-22 DIAGNOSIS — Z6832 Body mass index (BMI) 32.0-32.9, adult: Secondary | ICD-10-CM | POA: Diagnosis not present

## 2018-01-22 DIAGNOSIS — Z1389 Encounter for screening for other disorder: Secondary | ICD-10-CM | POA: Diagnosis not present

## 2018-01-22 DIAGNOSIS — E7849 Other hyperlipidemia: Secondary | ICD-10-CM | POA: Diagnosis not present

## 2018-01-22 DIAGNOSIS — K210 Gastro-esophageal reflux disease with esophagitis: Secondary | ICD-10-CM | POA: Diagnosis not present

## 2018-01-22 DIAGNOSIS — E038 Other specified hypothyroidism: Secondary | ICD-10-CM | POA: Diagnosis not present

## 2018-01-22 DIAGNOSIS — E119 Type 2 diabetes mellitus without complications: Secondary | ICD-10-CM | POA: Diagnosis not present

## 2018-01-22 DIAGNOSIS — Z0000 Encounter for general adult medical examination without abnormal findings: Secondary | ICD-10-CM | POA: Diagnosis not present

## 2018-04-09 ENCOUNTER — Encounter (HOSPITAL_COMMUNITY): Payer: Self-pay

## 2018-04-09 DIAGNOSIS — Z79899 Other long term (current) drug therapy: Secondary | ICD-10-CM | POA: Diagnosis not present

## 2018-04-09 DIAGNOSIS — J45909 Unspecified asthma, uncomplicated: Secondary | ICD-10-CM | POA: Diagnosis not present

## 2018-04-09 DIAGNOSIS — K56609 Unspecified intestinal obstruction, unspecified as to partial versus complete obstruction: Secondary | ICD-10-CM | POA: Diagnosis not present

## 2018-04-09 DIAGNOSIS — K581 Irritable bowel syndrome with constipation: Secondary | ICD-10-CM | POA: Diagnosis not present

## 2018-04-09 DIAGNOSIS — I10 Essential (primary) hypertension: Secondary | ICD-10-CM | POA: Diagnosis not present

## 2018-04-09 DIAGNOSIS — K56699 Other intestinal obstruction unspecified as to partial versus complete obstruction: Principal | ICD-10-CM | POA: Insufficient documentation

## 2018-04-09 DIAGNOSIS — R109 Unspecified abdominal pain: Secondary | ICD-10-CM | POA: Diagnosis not present

## 2018-04-09 DIAGNOSIS — R1031 Right lower quadrant pain: Secondary | ICD-10-CM | POA: Diagnosis not present

## 2018-04-09 DIAGNOSIS — R1110 Vomiting, unspecified: Secondary | ICD-10-CM | POA: Diagnosis not present

## 2018-04-09 DIAGNOSIS — R112 Nausea with vomiting, unspecified: Secondary | ICD-10-CM | POA: Diagnosis not present

## 2018-04-09 DIAGNOSIS — K219 Gastro-esophageal reflux disease without esophagitis: Secondary | ICD-10-CM | POA: Insufficient documentation

## 2018-04-09 DIAGNOSIS — Z87891 Personal history of nicotine dependence: Secondary | ICD-10-CM | POA: Insufficient documentation

## 2018-04-09 DIAGNOSIS — R1032 Left lower quadrant pain: Secondary | ICD-10-CM | POA: Diagnosis not present

## 2018-04-09 DIAGNOSIS — E039 Hypothyroidism, unspecified: Secondary | ICD-10-CM | POA: Insufficient documentation

## 2018-04-09 DIAGNOSIS — R197 Diarrhea, unspecified: Secondary | ICD-10-CM | POA: Diagnosis not present

## 2018-04-09 NOTE — ED Triage Notes (Signed)
Abd pain with n/v/d since Tuesday, now unable to keep anything down

## 2018-04-10 ENCOUNTER — Other Ambulatory Visit: Payer: Self-pay

## 2018-04-10 ENCOUNTER — Encounter (HOSPITAL_COMMUNITY): Payer: Self-pay | Admitting: *Deleted

## 2018-04-10 ENCOUNTER — Observation Stay (HOSPITAL_COMMUNITY)
Admission: EM | Admit: 2018-04-10 | Discharge: 2018-04-12 | Disposition: A | Discharge status: Home / Self Care | Payer: Medicare HMO | Attending: Internal Medicine | Admitting: Internal Medicine

## 2018-04-10 ENCOUNTER — Emergency Department (HOSPITAL_COMMUNITY): Payer: Medicare HMO

## 2018-04-10 DIAGNOSIS — R112 Nausea with vomiting, unspecified: Secondary | ICD-10-CM | POA: Diagnosis not present

## 2018-04-10 DIAGNOSIS — R197 Diarrhea, unspecified: Secondary | ICD-10-CM | POA: Diagnosis not present

## 2018-04-10 DIAGNOSIS — K589 Irritable bowel syndrome without diarrhea: Secondary | ICD-10-CM | POA: Diagnosis present

## 2018-04-10 DIAGNOSIS — K567 Ileus, unspecified: Secondary | ICD-10-CM | POA: Diagnosis present

## 2018-04-10 DIAGNOSIS — K219 Gastro-esophageal reflux disease without esophagitis: Secondary | ICD-10-CM | POA: Diagnosis present

## 2018-04-10 DIAGNOSIS — K56609 Unspecified intestinal obstruction, unspecified as to partial versus complete obstruction: Secondary | ICD-10-CM | POA: Diagnosis present

## 2018-04-10 DIAGNOSIS — K56600 Partial intestinal obstruction, unspecified as to cause: Secondary | ICD-10-CM

## 2018-04-10 DIAGNOSIS — R1030 Lower abdominal pain, unspecified: Secondary | ICD-10-CM | POA: Diagnosis present

## 2018-04-10 DIAGNOSIS — R109 Unspecified abdominal pain: Secondary | ICD-10-CM | POA: Diagnosis not present

## 2018-04-10 DIAGNOSIS — K5900 Constipation, unspecified: Secondary | ICD-10-CM | POA: Diagnosis present

## 2018-04-10 DIAGNOSIS — I10 Essential (primary) hypertension: Secondary | ICD-10-CM | POA: Diagnosis present

## 2018-04-10 DIAGNOSIS — R1110 Vomiting, unspecified: Secondary | ICD-10-CM | POA: Diagnosis not present

## 2018-04-10 LAB — COMPREHENSIVE METABOLIC PANEL
ALBUMIN: 4.2 g/dL (ref 3.5–5.0)
ALK PHOS: 50 U/L (ref 38–126)
ALT: 23 U/L (ref 14–54)
AST: 28 U/L (ref 15–41)
Anion gap: 11 (ref 5–15)
BILIRUBIN TOTAL: 0.6 mg/dL (ref 0.3–1.2)
BUN: 17 mg/dL (ref 6–20)
CO2: 24 mmol/L (ref 22–32)
CREATININE: 1.05 mg/dL — AB (ref 0.44–1.00)
Calcium: 9.3 mg/dL (ref 8.9–10.3)
Chloride: 102 mmol/L (ref 101–111)
GFR calc Af Amer: >60 mL/min (ref >60)
GFR, EST NON AFRICAN AMERICAN: 57 mL/min — AB (ref >60)
GLUCOSE: 107 mg/dL — AB (ref 65–99)
Potassium: 4.3 mmol/L (ref 3.5–5.1)
Sodium: 137 mmol/L (ref 135–145)
TOTAL PROTEIN: 7.8 g/dL (ref 6.5–8.1)

## 2018-04-10 LAB — URINALYSIS, ROUTINE W REFLEX MICROSCOPIC
Bilirubin Urine: NEGATIVE
Glucose, UA: NEGATIVE mg/dL
Ketones, ur: NEGATIVE mg/dL
Leukocytes, UA: NEGATIVE
NITRITE: NEGATIVE
PH: 5 (ref 5.0–8.0)
Protein, ur: NEGATIVE mg/dL
SPECIFIC GRAVITY, URINE: 1.019 (ref 1.005–1.030)

## 2018-04-10 LAB — CBC
HCT: 43.7 % (ref 36.0–46.0)
Hemoglobin: 14.6 g/dL (ref 12.0–15.0)
MCH: 30.5 pg (ref 26.0–34.0)
MCHC: 33.4 g/dL (ref 30.0–36.0)
MCV: 91.2 fL (ref 78.0–100.0)
PLATELETS: 226 10*3/uL (ref 150–400)
RBC: 4.79 MIL/uL (ref 3.87–5.11)
RDW: 13.8 % (ref 11.5–15.5)
WBC: 7.7 10*3/uL (ref 4.0–10.5)

## 2018-04-10 LAB — CBG MONITORING, ED: Glucose-Capillary: 122 mg/dL — ABNORMAL HIGH (ref 65–99)

## 2018-04-10 LAB — LIPASE, BLOOD: Lipase: 31 U/L (ref 11–51)

## 2018-04-10 MED ORDER — IOPAMIDOL (ISOVUE-300) INJECTION 61%
100.0000 mL | Freq: Once | INTRAVENOUS | Status: AC | PRN
  Administered 2018-04-10: 100 mL via INTRAVENOUS

## 2018-04-10 MED ORDER — IMIPRAMINE HCL 10 MG PO TABS
40.0000 mg | ORAL_TABLET | Freq: Every day | ORAL | Status: DC
  Administered 2018-04-10 – 2018-04-11 (×2): 40 mg via ORAL
  Filled 2018-04-10 (×3): qty 4

## 2018-04-10 MED ORDER — ENOXAPARIN SODIUM 40 MG/0.4ML ~~LOC~~ SOLN
40.0000 mg | SUBCUTANEOUS | Status: DC
  Administered 2018-04-10 – 2018-04-12 (×3): 40 mg via SUBCUTANEOUS
  Filled 2018-04-10 (×3): qty 0.4

## 2018-04-10 MED ORDER — MORPHINE SULFATE (PF) 4 MG/ML IV SOLN
4.0000 mg | Freq: Once | INTRAVENOUS | Status: AC
  Administered 2018-04-10: 4 mg via INTRAVENOUS
  Filled 2018-04-10: qty 1

## 2018-04-10 MED ORDER — GABAPENTIN 300 MG PO CAPS
300.0000 mg | ORAL_CAPSULE | Freq: Two times a day (BID) | ORAL | Status: DC
  Administered 2018-04-10 – 2018-04-12 (×5): 300 mg via ORAL
  Filled 2018-04-10 (×5): qty 1

## 2018-04-10 MED ORDER — ONDANSETRON HCL 4 MG/2ML IJ SOLN
4.0000 mg | Freq: Four times a day (QID) | INTRAMUSCULAR | Status: DC | PRN
  Administered 2018-04-10 – 2018-04-12 (×5): 4 mg via INTRAVENOUS
  Filled 2018-04-10 (×6): qty 2

## 2018-04-10 MED ORDER — ONDANSETRON HCL 4 MG PO TABS: 4.0000 mg | ORAL_TABLET | Freq: Four times a day (QID) | ORAL | Status: DC | PRN

## 2018-04-10 MED ORDER — SODIUM CHLORIDE 0.9 % IV BOLUS
1000.0000 mL | Freq: Once | INTRAVENOUS | Status: AC
  Administered 2018-04-10: 1000 mL via INTRAVENOUS

## 2018-04-10 MED ORDER — ADULT MULTIVITAMIN W/MINERALS CH
ORAL_TABLET | Freq: Every day | ORAL | Status: DC
  Administered 2018-04-10 – 2018-04-12 (×3): 1 via ORAL
  Filled 2018-04-10 (×3): qty 1

## 2018-04-10 MED ORDER — ONDANSETRON HCL 4 MG/2ML IJ SOLN
4.0000 mg | Freq: Once | INTRAMUSCULAR | Status: AC
  Administered 2018-04-10: 4 mg via INTRAVENOUS
  Filled 2018-04-10: qty 2

## 2018-04-10 MED ORDER — MORPHINE SULFATE (PF) 2 MG/ML IV SOLN
2.0000 mg | INTRAVENOUS | Status: DC | PRN
  Administered 2018-04-10 – 2018-04-12 (×9): 2 mg via INTRAVENOUS
  Filled 2018-04-10 (×9): qty 1

## 2018-04-10 MED ORDER — ACETAMINOPHEN 650 MG RE SUPP: 650.0000 mg | Freq: Four times a day (QID) | RECTAL | Status: DC | PRN

## 2018-04-10 MED ORDER — QUETIAPINE FUMARATE 25 MG PO TABS
50.0000 mg | ORAL_TABLET | Freq: Every day | ORAL | Status: DC
  Administered 2018-04-10 – 2018-04-11 (×2): 50 mg via ORAL
  Filled 2018-04-10 (×2): qty 2

## 2018-04-10 MED ORDER — DICYCLOMINE HCL 10 MG PO CAPS
10.0000 mg | ORAL_CAPSULE | Freq: Three times a day (TID) | ORAL | Status: DC
  Administered 2018-04-10 – 2018-04-12 (×9): 10 mg via ORAL
  Filled 2018-04-10 (×9): qty 1

## 2018-04-10 MED ORDER — LEVOTHYROXINE SODIUM 25 MCG PO TABS
25.0000 ug | ORAL_TABLET | Freq: Every day | ORAL | Status: DC
  Administered 2018-04-10 – 2018-04-12 (×3): 25 ug via ORAL
  Filled 2018-04-10 (×3): qty 1

## 2018-04-10 MED ORDER — ACETAMINOPHEN 325 MG PO TABS: 650.0000 mg | ORAL_TABLET | Freq: Four times a day (QID) | ORAL | Status: DC | PRN

## 2018-04-10 MED ORDER — PANTOPRAZOLE SODIUM 40 MG PO TBEC
40.0000 mg | DELAYED_RELEASE_TABLET | Freq: Every day | ORAL | Status: DC
  Administered 2018-04-10 – 2018-04-12 (×3): 40 mg via ORAL
  Filled 2018-04-10 (×3): qty 1

## 2018-04-10 MED ORDER — LACTATED RINGERS IV SOLN
INTRAVENOUS | Status: AC
  Administered 2018-04-10 (×2): via INTRAVENOUS

## 2018-04-10 NOTE — H&P (Addendum)
History and Physical    [name] GGY:[id number] DOB: 06/18/1961 DOA: 04/10/2018  PCP: Neale Burly, MD   Patient coming from: Home  Chief Complaint: Nausea, vomiting, and epigastric abdominal pain  HPI: [name] is a 57 y.o. female with medical history significant for IBS, hypothyroidism, GERD, hypertension, and prior subtotal colectomy for complicated diverticulitis in 2004 who presents to the emergency department with ongoing nausea and vomiting along with epigastric abdominal pain that began on Tuesday.  She states that her symptoms have been ongoing and constant and as a result has had very little oral intake.  She also reports some loose bowel movements which she has been having regularly.  She denies any abdominal distention.  Her symptoms appear to be worsened when she tries to eat or drink with no alleviating factors noted. Denies any fever, chills, hematemesis, or black/tarry stools.   ED Course: Vital signs are noted to be stable and laboratory data is otherwise unremarkable.  CT of the abdomen and pelvis with contrast demonstrates what appears to be either a low-grade obstruction or focal ileus at the site of anastomosis where she has had prior subtotal colectomy.  No other acute findings are noted.  She has been started on IV fluid and given some Zofran with some relief noted thus far.  She claims that she is hungry and would like to try some food if possible.  Review of Systems: All others reviewed and otherwise negative.  Past Medical History:  Diagnosis Date  . Anxiety   . Asthma   . Chronic back pain   . Depression   . Diverticulitis    complicated by abscess requiring total colectomy and take-down of colostomy in 2004  . GERD (gastroesophageal reflux disease)   . Hemorrhoids   . Hiatal hernia   . HTN (hypertension)   . Hypothyroidism   . IBS (irritable bowel syndrome)   . Migraine   . Neck pain, chronic   . Obesity   . Polyp of colon,  hyperplastic   . [facility] spotted fever   . Terminal ileitis of small intestine ([place])    referred to [facility], Crohn's ruled out  . Thyroid disease     Past Surgical History:  Procedure Laterality Date  . APPENDECTOMY    . CHOLECYSTECTOMY  2004   at time of colectomy  . COLONOSCOPY  03/2009   anastomotic ulcers and neoterminal ileum ulcers, anastomosis with SB at 30cm. bx s/o Crohn's  . COLONOSCOPY  2005   Dr. Irving Shows: rectal ulcers  . COLONOSCOPY  2011   [facility]  . COLONOSCOPY N/A 01/30/2016   Dr.Rourk- internal hemorrhoids, likely source of hematochezia, rectal polyps removed s/p subtotal colectomy with normal appearing residual colonic and small bowel mucosa bx= hyperplasticpolyp  . ESOPHAGOGASTRODUODENOSCOPY  02/2009   mild erosive reflux esophagitis  . ESOPHAGOGASTRODUODENOSCOPY  07/2010   probable cervical esophageal web s/p dilation  . ESOPHAGOGASTRODUODENOSCOPY N/A 01/30/2016   Dr.Rourk- normal apearing patent tubular esophagus, hiatal hernia  . ethmoid mucocele, turbinate surgery  2002   right  . GIVENS CAPSULE STUDY  11/2009   Dr. Gala Romney: subtle erytherma and edema of TI  . HEMORRHOID BANDING  2017   Dr.Rourk  . hysterectomy  2009   partial  . LIVER BIOPSY  2004   steatosis, at time of colectomy  . SUBTOTAL COLECTOMY  2004   diverticulitis with abscess  . UMBILICAL HERNIA REPAIR       reports that she quit smoking  about 11 years ago. Her smoking use included cigarettes. She has a 5.00 pack-year smoking history. She has never used smokeless tobacco. She reports that she drinks alcohol. She reports that she does not use drugs.  Allergies  Allergen Reactions  . Hydromorphone Hcl Itching    REACTION: Dilaudid    Family History  Problem Relation Age of Onset  . ALS Father   . Crohn's disease Daughter   . Diabetes Mother   . Rheum arthritis Mother     Prior to Admission medications   Medication Sig Start Date End Date Taking? Authorizing Provider    colestipol (COLESTID) 1 g tablet Take 1 g by mouth 3 (three) times daily.    [provider]  dicyclomine (BENTYL) 10 MG capsule Take 10 mg by mouth 4 (four) times daily -  before meals and at bedtime.      [provider]  esomeprazole (NEXIUM) 20 MG capsule Take 1 capsule (20 mg total) by mouth 2 (two) times daily before a meal. 04/26/16   Rourk, Cristopher Estimable, MD  gabapentin (NEURONTIN) 300 MG capsule Take 300 mg by mouth 2 (two) times daily.      [provider]  imipramine (TOFRANIL) 10 MG tablet Take 40 mg by mouth at bedtime.      [provider]  levothyroxine (SYNTHROID, LEVOTHROID) 25 MCG tablet Take 25 mcg by mouth daily. 07/05/16   [provider]  metFORMIN (GLUCOPHAGE) 500 MG tablet Take 500 mg by mouth daily.    [provider]  Multiple Vitamins-Calcium (ONE-A-DAY WOMENS FORMULA PO) Take 1 tablet by mouth daily. Alternating each week with Caltrate w/D     [provider]  NAPROXEN PO Take 50 mg by mouth as needed.    [provider]  Omega-3 Fatty Acids (FISH OIL) 1200 MG CAPS Take 1 capsule by mouth 2 (two) times daily.    [provider]  omeprazole (PRILOSEC) 20 MG capsule Take 1 capsule (20 mg total) by mouth daily. 01/17/16   Mahala Menghini, PA-C  QUEtiapine (SEROQUEL) 50 MG tablet Take 50 mg by mouth at bedtime.      [provider]  ranitidine (ZANTAC) 300 MG tablet Take 300 mg by mouth at bedtime.    [provider]  simvastatin (ZOCOR) 10 MG tablet Take 10 mg by mouth daily. 07/05/16   [provider]  spironolactone (ALDACTONE) 50 MG tablet Take 50 mg by mouth 2 (two) times daily.     [provider]    Physical Exam: Vitals:   04/09/18 2327 04/10/18 0439  BP: 107/82 114/80  Pulse: 87 68  Resp: 20 18  Temp: 98.7 F (37.1 C)   TempSrc: Oral   SpO2: 100% 99%  Weight: 65.8 kg (145 lb)   Height: 5\' 3"  (1.6 m)     Constitutional: NAD, calm,  comfortable Vitals:   04/09/18 2327 04/10/18 0439  BP: 107/82 114/80  Pulse: 87 68  Resp: 20 18  Temp: 98.7 F (37.1 C)   TempSrc: Oral   SpO2: 100% 99%  Weight: 65.8 kg (145 lb)   Height: 5\' 3"  (1.6 m)    Eyes: lids and conjunctivae normal ENMT: Mucous membranes are moist.  Neck: normal, supple Respiratory: clear to auscultation bilaterally. Normal respiratory effort. No accessory muscle use.  Cardiovascular: Regular rate and rhythm, no murmurs. No extremity edema. Abdomen: no tenderness, no distention. Bowel sounds positive.  Musculoskeletal:  No joint deformity upper and lower extremities.  Skin: no rashes, lesions, ulcers.  Psychiatric: Normal judgment and insight. Alert and oriented x 3. Normal mood.   Labs on Admission: I have personally reviewed following labs and imaging studies  CBC: Recent Labs  Lab 04/10/18 0209  WBC 7.7  HGB 14.6  HCT 43.7  MCV 91.2  PLT 409   Basic Metabolic Panel: Recent Labs  Lab 04/10/18 0209  NA 137  K 4.3  CL 102  CO2 24  GLUCOSE 107*  BUN 17  CREATININE 1.05*  CALCIUM 9.3   GFR: Estimated Creatinine Clearance: 53.3 mL/min (A) (by C-G formula based on SCr of 1.05 mg/dL (H)). Liver Function Tests: Recent Labs  Lab 04/10/18 0209  AST 28  ALT 23  ALKPHOS 50  BILITOT 0.6  PROT 7.8  ALBUMIN 4.2   Recent Labs  Lab 04/10/18 0209  LIPASE 31   No results for input(s): AMMONIA in the last 168 hours. Coagulation Profile: No results for input(s): INR, PROTIME in the last 168 hours. Cardiac Enzymes: No results for input(s): CKTOTAL, CKMB, CKMBINDEX, TROPONINI in the last 168 hours. BNP (last 3 results) No results for input(s): PROBNP in the last 8760 hours. HbA1C: No results for input(s): HGBA1C in the last 72 hours. CBG: No results for input(s): GLUCAP in the last 168 hours. Lipid Profile: No results for input(s): CHOL, HDL, LDLCALC, TRIG, CHOLHDL, LDLDIRECT in the last 72 hours. Thyroid Function Tests: No results  for input(s): TSH, T4TOTAL, FREET4, T3FREE, THYROIDAB in the last 72 hours. Anemia Panel: No results for input(s): VITAMINB12, FOLATE, FERRITIN, TIBC, IRON, RETICCTPCT in the last 72 hours. Urine analysis:    Component Value Date/Time   COLORURINE YELLOW 04/10/2018 0142   APPEARANCEUR HAZY (A) 04/10/2018 0142   LABSPEC 1.019 04/10/2018 0142   PHURINE 5.0 04/10/2018 0142   GLUCOSEU NEGATIVE 04/10/2018 0142   GLUCOSEU NEG mg/dL 11/30/2010 2046   HGBUR MODERATE (A) 04/10/2018 0142   BILIRUBINUR NEGATIVE 04/10/2018 0142   KETONESUR NEGATIVE 04/10/2018 0142   PROTEINUR NEGATIVE 04/10/2018 0142   UROBILINOGEN 0.2 04/09/2011 1208   NITRITE NEGATIVE 04/10/2018 0142   LEUKOCYTESUR NEGATIVE 04/10/2018 0142    Radiological Exams on Admission: Ct Abdomen Pelvis W Contrast  Result Date: 04/10/2018 CLINICAL DATA:  Abdominal pain, nausea, vomiting and diarrhea. Assess for diverticulitis. History of diverticulitis, cholecystectomy, subtotal colectomy, appendectomy. EXAM: CT ABDOMEN AND PELVIS WITH CONTRAST TECHNIQUE: Multidetector CT imaging of the abdomen and pelvis was performed using the standard protocol following bolus administration of intravenous contrast. CONTRAST:  152mL ISOVUE-300 IOPAMIDOL (ISOVUE-300) INJECTION 61% COMPARISON:  CT abdomen and pelvis January 03, 2016 FINDINGS: LOWER CHEST: Bibasilar atelectasis. Included heart size is normal. No pericardial effusion. HEPATOBILIARY: Status post cholecystectomy. Minimal focal fatty infiltration about the falciform ligament, otherwise negative liver. PANCREAS: Normal. SPLEEN: Normal. ADRENALS/URINARY TRACT: Kidneys are orthotopic, demonstrating symmetric enhancement. No nephrolithiasis, hydronephrosis or solid renal masses. Too small to characterize hypodensities RIGHT kidney. The unopacified ureters are normal in course and caliber. Delayed imaging through the kidneys demonstrates symmetric prompt contrast excretion within the proximal urinary  collecting system. Urinary bladder is partially distended and unremarkable. Normal adrenal glands. STOMACH/BOWEL: Status post subtotal colectomy at the level of the sigmoid colon. Dilated proximal small bowel at 3.5 cm with small bowel feces. Mild inflammatory changes about the anastomosis versus decompressed bowel. VASCULAR/LYMPHATIC: Aortoiliac vessels are normal in course and caliber. Trace calcific atherosclerosis no lymphadenopathy by CT size criteria. REPRODUCTIVE: Status post hysterectomy. OTHER: No intraperitoneal free fluid or free air. MUSCULOSKELETAL: Irregular sclerosis RIGHT  femoral head and neck. Moderate lower lumbar facet arthropathy. Anterior abdominal wall scarring. IMPRESSION: 1. Status post subtotal colectomy with low-grade obstruction versus focal ileus at the level of the anastomosis. 2. RIGHT femoral head avascular necrosis/bone infarct without collapse. Aortic Atherosclerosis (ICD10-I70.0). Electronically Signed   By: Elon Alas M.D.   On: 04/10/2018 04:36    Assessment/Plan Principal Problem:   NAUSEA AND VOMITING Active Problems:   Essential hypertension   GERD   IBS   Abdominal pain, epigastric    1. Intractable nausea and vomiting likely secondary to mild partial small bowel obstruction versus focal ileus.  Continue on IV fluids and administer Zofran as needed for symptoms.  Start clear liquid diet and advance as tolerated.  Hold Spironolactone for now. 2. Hypertension.  Currently well controlled.  Hold Spironolactone for now. 3. GERD.  PPI. 4. Hypothyroidism.  Continue Synthroid. 5. Diabetes.  Hold metformin.  Blood glucose currently well controlled-SSI as needed. Advance diet to carb modified.   DVT prophylaxis: Lovenox Code Status: Full Family Communication: Husband at bedside Disposition Plan: Conservative management with hydration and diet advancement until symptom resolution Consults called:None Admission status: Obs, med-surg    Darleen Crocker  DO [facility] Pager [phone]  If 7PM-7AM, please contact night-coverage [email or web address] Password [facility]  04/10/2018, 5:48 AM

## 2018-04-10 NOTE — ED Notes (Signed)
Report given to [facility], patient ready for transfer.

## 2018-04-10 NOTE — Care Management Obs Status (Signed)
[facility] NOTIFICATION   Patient Details  Name: [name] MRN: [id number] Date of Birth: May 18, 1961   Medicare Observation Status Notification Given:  Yes    Sherald Barge, RN 04/10/2018, 2:58 PM

## 2018-04-10 NOTE — Progress Notes (Signed)
PROGRESS NOTE                                                                                                                                                                                                             Patient Demographics:    [name], is a 57 y.o. female, DOB - 09/15/1961, WNU:[id number]  Admit date - 04/10/2018   Admitting Physician No admitting provider for patient encounter.  Outpatient Primary MD for the patient is Hasanaj, Samul Dada, MD  LOS - 0  Outpatient Specialists: None  Chief Complaint  Patient presents with  . Abdominal Pain    n/v/d       Brief Narrative 57 year old female with history of hypothyroidism, GERD, hypertension, IBS and prior subtotal colectomy for complicated diverticulitis in 2004 resented with ongoing nausea and vomiting with epigastric and mid abdominal pain since 2 days prior to admission.  Patient reports very poor p.o. intake with ongoing pain.  Also had some loose bowel movement but no abdominal distention.  Pain worsened with attempting to eat or drink, no associated fevers, chills, hematemesis, melena or bright red blood per rectum. In the ED vitals were stable.  CT of the abdomen and pelvis with contrast showing low-grade obstruction versus focal ileus at the site of anastomosis of prior subtotal colectomy. Patient given IV fluids, antiemetics and placed on observation.   Subjective:   Denies nausea or vomiting at this time and has minimal abdominal pain.   Assessment  & Plan :   Principal problem Partial small bowel obstruction versus focal ileus Keep n.p.o.  IV hydration, pain control with PRN morphine and antiemetics. Serial abdominal exam.  Obtain follow-up x-ray and labs in a.m.  Potassium >4. If no improvement or worsening will need surgery evaluation.   Essential hypertension Stable.  Holding home medication  GERD Placed on IV PPI  daily  Hypothyroidism Holding Synthroid as patient is n.p.o.  Diabetes mellitus type II Monitor CBG every 4 hours with sliding scale coverage.   Code Status : Full code  Family Communication  : None at bedside  Disposition Plan  : Home once improved possibly in the next 48 hours  Barriers For Discharge : Active symptoms  Consults  : None  Procedures  : CT abdomen  DVT Prophylaxis  :  Lovenox -   Lab Results  Component Value Date   PLT 226 04/10/2018    Antibiotics  :    Anti-infectives (From admission, onward)   None        Objective:   Vitals:   04/10/18 1000 04/10/18 1100 04/10/18 1133 04/10/18 1200  BP: 102/79 91/71 101/79 97/75  Pulse: 63  74 65  Resp:   18   Temp:   97.6 F (36.4 C)   TempSrc:   Oral   SpO2: 100%  100% 100%  Weight:      Height:        Wt Readings from Last 3 Encounters:  04/09/18 65.8 kg (145 lb)  07/30/16 84 kg (185 lb 3.2 oz)  04/26/16 80.9 kg (178 lb 6.4 oz)     Intake/Output Summary (Last 24 hours) at 04/10/2018 1300 Last data filed at 04/10/2018 0516 Gross per 24 hour  Intake 1000 ml  Output -  Net 1000 ml     Physical Exam  Gen: not in distress HEENT: Dry mucosa, supple neck Chest: clear b/l, no added sounds CVS: N S1&S2, no murmurs,  GI: soft, nondistended, sluggish bowel sounds, tender to pressure over epigastric and periumbilical area Musculoskeletal: warm, no edema     Data Review:    CBC Recent Labs  Lab 04/10/18 0209  WBC 7.7  HGB 14.6  HCT 43.7  PLT 226  MCV 91.2  MCH 30.5  MCHC 33.4  RDW 13.8    Chemistries  Recent Labs  Lab 04/10/18 0209  NA 137  K 4.3  CL 102  CO2 24  GLUCOSE 107*  BUN 17  CREATININE 1.05*  CALCIUM 9.3  AST 28  ALT 23  ALKPHOS 50  BILITOT 0.6   ------------------------------------------------------------------------------------------------------------------ No results for input(s): CHOL, HDL, LDLCALC, TRIG, CHOLHDL, LDLDIRECT in the last 72  hours.  Lab Results  Component Value Date   HGBA1C 6.4 (H) 06/03/2011   ------------------------------------------------------------------------------------------------------------------ No results for input(s): TSH, T4TOTAL, T3FREE, THYROIDAB in the last 72 hours.  Invalid input(s): FREET3 ------------------------------------------------------------------------------------------------------------------ No results for input(s): VITAMINB12, FOLATE, FERRITIN, TIBC, IRON, RETICCTPCT in the last 72 hours.  Coagulation profile No results for input(s): INR, PROTIME in the last 168 hours.  No results for input(s): DDIMER in the last 72 hours.  Cardiac Enzymes No results for input(s): CKMB, TROPONINI, MYOGLOBIN in the last 168 hours.  Invalid input(s): CK ------------------------------------------------------------------------------------------------------------------ No results found for: BNP  Inpatient Medications  Scheduled Meds: . dicyclomine  10 mg Oral TID AC & HS  . enoxaparin (LOVENOX) injection  40 mg Subcutaneous Q24H  . gabapentin  300 mg Oral BID  . imipramine  40 mg Oral QHS  . levothyroxine  25 mcg Oral QAC breakfast  . multivitamin with minerals   Oral Daily  . pantoprazole  40 mg Oral Daily  . QUEtiapine  50 mg Oral QHS   Continuous Infusions: . lactated ringers 100 mL/hr at 04/10/18 0622   PRN Meds:.acetaminophen **OR** acetaminophen, ondansetron **OR** ondansetron (ZOFRAN) IV  Micro Results No results found for this or any previous visit (from the past 240 hour(s)).  Radiology Reports Ct Abdomen Pelvis W Contrast  Result Date: 04/10/2018 CLINICAL DATA:  Abdominal pain, nausea, vomiting and diarrhea. Assess for diverticulitis. History of diverticulitis, cholecystectomy, subtotal colectomy, appendectomy. EXAM: CT ABDOMEN AND PELVIS WITH CONTRAST TECHNIQUE: Multidetector CT imaging of the abdomen and pelvis was performed using the standard protocol following  bolus administration of intravenous contrast. CONTRAST:  151mL ISOVUE-300 IOPAMIDOL (ISOVUE-300) INJECTION 61% COMPARISON:  CT abdomen and  pelvis January 03, 2016 FINDINGS: LOWER CHEST: Bibasilar atelectasis. Included heart size is normal. No pericardial effusion. HEPATOBILIARY: Status post cholecystectomy. Minimal focal fatty infiltration about the falciform ligament, otherwise negative liver. PANCREAS: Normal. SPLEEN: Normal. ADRENALS/URINARY TRACT: Kidneys are orthotopic, demonstrating symmetric enhancement. No nephrolithiasis, hydronephrosis or solid renal masses. Too small to characterize hypodensities RIGHT kidney. The unopacified ureters are normal in course and caliber. Delayed imaging through the kidneys demonstrates symmetric prompt contrast excretion within the proximal urinary collecting system. Urinary bladder is partially distended and unremarkable. Normal adrenal glands. STOMACH/BOWEL: Status post subtotal colectomy at the level of the sigmoid colon. Dilated proximal small bowel at 3.5 cm with small bowel feces. Mild inflammatory changes about the anastomosis versus decompressed bowel. VASCULAR/LYMPHATIC: Aortoiliac vessels are normal in course and caliber. Trace calcific atherosclerosis no lymphadenopathy by CT size criteria. REPRODUCTIVE: Status post hysterectomy. OTHER: No intraperitoneal free fluid or free air. MUSCULOSKELETAL: Irregular sclerosis RIGHT femoral head and neck. Moderate lower lumbar facet arthropathy. Anterior abdominal wall scarring. IMPRESSION: 1. Status post subtotal colectomy with low-grade obstruction versus focal ileus at the level of the anastomosis. 2. RIGHT femoral head avascular necrosis/bone infarct without collapse. Aortic Atherosclerosis (ICD10-I70.0). Electronically Signed   By: Elon Alas M.D.   On: 04/10/2018 04:36    Time Spent in minutes  20     M.D on 04/10/2018 at 1:00 PM  Between 7am to 7pm - Pager - [phone]  After 7pm go to  [email or web address] - password [facility]  [facility] -  Office  [phone]

## 2018-04-10 NOTE — ED Provider Notes (Signed)
[facility] EMERGENCY DEPARTMENT Provider Note   CSN: [id number] Arrival date & time: 04/09/18  2320     History   Chief Complaint Chief Complaint  Patient presents with  . Abdominal Pain    n/v/d    HPI VERTA RIEDLINGER is a 57 y.o. female.  Patient is a 57 year old female with past medical history of diverticulitis, hypertension, irritable bowel.  She presents today for evaluation of abdominal pain, nausea, vomiting, and occasional loose stools.  This is been ongoing for the past 3 days.  All has been nonbloody.  She denies any fevers.  She denies any ill contacts.  Her symptoms are worse when she tries to eat or drink.  There are no alleviating factors.  The history is provided by the patient.    Past Medical History:  Diagnosis Date  . Anxiety   . Asthma   . Chronic back pain   . Depression   . Diverticulitis    complicated by abscess requiring total colectomy and take-down of colostomy in 2004  . GERD (gastroesophageal reflux disease)   . Hemorrhoids   . Hiatal hernia   . HTN (hypertension)   . Hypothyroidism   . IBS (irritable bowel syndrome)   . Migraine   . Neck pain, chronic   . Obesity   . Polyp of colon, hyperplastic   . [facility] spotted fever   . Terminal ileitis of small intestine ([place])    referred to [facility], Crohn's ruled out  . Thyroid disease     Patient Active Problem List   Diagnosis Date Noted  . Rectal polyp   . Hiatal hernia   . Nausea without vomiting 01/15/2016  . Abdominal pain, epigastric 01/15/2016  . Rectal bleeding 01/15/2016  . Suprapubic pain 04/09/2011  . Abnormal CT of the abdomen 04/09/2011  . Weight loss, abnormal 04/09/2011  . DYSPHAGIA UNSPECIFIED 07/18/2010  . NAUSEA AND VOMITING 07/04/2010  . HYPERLIPIDEMIA 03/02/2010  . DYSPNEA 03/02/2010  . GI BLEEDING 11/23/2009  . [place] DISEASE, LUMBOSACRAL SPINE 08/03/2009  . SPINAL STENOSIS 07/24/2009  . SCIATICA 07/03/2009  . ASEPTIC NECROSIS 07/03/2009  . ILEITIS  06/13/2009  . ANEMIA, NORMOCYTIC 03/27/2009  . IBS 03/27/2009  . FATTY LIVER DISEASE 03/27/2009  . NECK PAIN, CHRONIC 03/27/2009  . DIARRHEA 03/27/2009  . ABDOMINAL PAIN, CHRONIC 03/27/2009  . ANXIETY 03/24/2009  . ALCOHOL USE 03/24/2009  . SMOKER 03/24/2009  . HYPERTENSION 03/24/2009  . Internal hemorrhoids 03/24/2009  . SCHATZKI'S RING 03/24/2009  . GERD 03/24/2009  . DIVERTICULITIS, HX OF 03/24/2009    Past Surgical History:  Procedure Laterality Date  . APPENDECTOMY    . CHOLECYSTECTOMY  2004   at time of colectomy  . COLONOSCOPY  03/2009   anastomotic ulcers and neoterminal ileum ulcers, anastomosis with SB at 30cm. bx s/o Crohn's  . COLONOSCOPY  2005   Dr. Irving Shows: rectal ulcers  . COLONOSCOPY  2011   [facility]  . COLONOSCOPY N/A 01/30/2016   Dr.Rourk- internal hemorrhoids, likely source of hematochezia, rectal polyps removed s/p subtotal colectomy with normal appearing residual colonic and small bowel mucosa bx= hyperplasticpolyp  . ESOPHAGOGASTRODUODENOSCOPY  02/2009   mild erosive reflux esophagitis  . ESOPHAGOGASTRODUODENOSCOPY  07/2010   probable cervical esophageal web s/p dilation  . ESOPHAGOGASTRODUODENOSCOPY N/A 01/30/2016   Dr.Rourk- normal apearing patent tubular esophagus, hiatal hernia  . ethmoid mucocele, turbinate surgery  2002   right  . GIVENS CAPSULE STUDY  11/2009   Dr. Gala Romney: subtle erytherma and edema  of TI  . HEMORRHOID BANDING  2017   Dr.Rourk  . hysterectomy  2009   partial  . LIVER BIOPSY  2004   steatosis, at time of colectomy  . SUBTOTAL COLECTOMY  2004   diverticulitis with abscess  . UMBILICAL HERNIA REPAIR       OB History   None      Home Medications    Prior to Admission medications   Medication Sig Start Date End Date Taking? Authorizing Provider  colestipol (COLESTID) 1 g tablet Take 1 g by mouth 3 (three) times daily.    [provider]  dicyclomine (BENTYL) 10 MG capsule Take 10 mg by mouth 4 (four) times  daily -  before meals and at bedtime.      [provider]  esomeprazole (NEXIUM) 20 MG capsule Take 1 capsule (20 mg total) by mouth 2 (two) times daily before a meal. 04/26/16   Rourk, Cristopher Estimable, MD  gabapentin (NEURONTIN) 300 MG capsule Take 300 mg by mouth 2 (two) times daily.      [provider]  imipramine (TOFRANIL) 10 MG tablet Take 40 mg by mouth at bedtime.      [provider]  levothyroxine (SYNTHROID, LEVOTHROID) 25 MCG tablet Take 25 mcg by mouth daily. 07/05/16   [provider]  metFORMIN (GLUCOPHAGE) 500 MG tablet Take 500 mg by mouth daily.    [provider]  Multiple Vitamins-Calcium (ONE-A-DAY WOMENS FORMULA PO) Take 1 tablet by mouth daily. Alternating each week with Caltrate w/D     [provider]  NAPROXEN PO Take 50 mg by mouth as needed.    [provider]  Omega-3 Fatty Acids (FISH OIL) 1200 MG CAPS Take 1 capsule by mouth 2 (two) times daily.    [provider]  omeprazole (PRILOSEC) 20 MG capsule Take 1 capsule (20 mg total) by mouth daily. 01/17/16   Mahala Menghini, PA-C  QUEtiapine (SEROQUEL) 50 MG tablet Take 50 mg by mouth at bedtime.      [provider]  ranitidine (ZANTAC) 300 MG tablet Take 300 mg by mouth at bedtime.    [provider]  simvastatin (ZOCOR) 10 MG tablet Take 10 mg by mouth daily. 07/05/16   [provider]  spironolactone (ALDACTONE) 50 MG tablet Take 50 mg by mouth 2 (two) times daily.     [provider]    Family History Family History  Problem Relation Age of Onset  . ALS Father   . Crohn's disease Daughter   . Diabetes Mother   . Rheum arthritis Mother     Social History Social History   Tobacco Use  . Smoking status: Former Smoker    Packs/day: 0.50    Years: 10.00    Pack years: 5.00    Types: Cigarettes    Last attempt to quit: 12/15/2006    Years since quitting: 11.3  . Smokeless tobacco: Never Used  Substance Use  Topics  . Alcohol use: Yes    Alcohol/week: 0.0 oz  . Drug use: No     Allergies   Hydromorphone hcl   Review of Systems Review of Systems  All other systems reviewed and are negative.    Physical Exam Updated Vital Signs BP 107/82 (BP Location: Right Arm)   Pulse 87   Temp 98.7 F (37.1 C) (Oral)   Resp 20   Ht 5\' 3"  (1.6 m)   Wt 65.8 kg (145 lb)   SpO2  100%   BMI 25.69 kg/m   Physical Exam  Constitutional: She is oriented to person, place, and time. She appears well-developed and well-nourished. No distress.  HENT:  Head: Normocephalic and atraumatic.  Neck: Normal range of motion. Neck supple.  Cardiovascular: Normal rate and regular rhythm. Exam reveals no gallop and no friction rub.  No murmur heard. Pulmonary/Chest: Effort normal and breath sounds normal. No respiratory distress. She has no wheezes.  Abdominal: Soft. Bowel sounds are normal. She exhibits no distension. There is tenderness in the right lower quadrant, suprapubic area and left lower quadrant. There is no rigidity, no rebound and no guarding.  There is tenderness to palpation across the lower abdomen including the right lower quadrant, left lower quadrant, and suprapubic region.  Musculoskeletal: Normal range of motion.  Neurological: She is alert and oriented to person, place, and time.  Skin: Skin is warm and dry. She is not diaphoretic.  Nursing note and vitals reviewed.    ED Treatments / Results  Labs (all labs ordered are listed, but only abnormal results are displayed) Labs Reviewed  URINALYSIS, ROUTINE W REFLEX MICROSCOPIC - Abnormal; Notable for the following components:      Result Value   APPearance HAZY (*)    Hgb urine dipstick MODERATE (*)    Bacteria, UA RARE (*)    All other components within normal limits  CBC  LIPASE, BLOOD  COMPREHENSIVE METABOLIC PANEL    EKG None  Radiology No results found.  Procedures Procedures (including critical care  time)  Medications Ordered in ED Medications - No data to display   Initial Impression / Assessment and Plan / ED Course  I have reviewed the triage vital signs and the nursing notes.  Pertinent labs & imaging results that were available during my care of the patient were reviewed by me and considered in my medical decision making (see chart for details).  CT scan shows ileus versus possible early bowel obstruction.  I feel as though the patient should be admitted to be observed.  I have spoken with Dr. Manuella Ghazi who agrees to admit.  Final Clinical Impressions(s) / ED Diagnoses   Final diagnoses:  None    ED Discharge Orders    None       Veryl Speak, MD 04/10/18 [phone]

## 2018-04-10 NOTE — Plan of Care (Signed)
Patient is alert and oriented x 4. She was oriented to her room and the unit. Her vitals remain stable. She has not had any adverse reactions to any medications. She did have one episode of nausea and required prn pain medication once this shift.Patient currently has no complaints at this time. All safety interventions maintained.

## 2018-04-11 ENCOUNTER — Observation Stay (HOSPITAL_COMMUNITY): Payer: Medicare HMO

## 2018-04-11 DIAGNOSIS — K567 Ileus, unspecified: Secondary | ICD-10-CM

## 2018-04-11 DIAGNOSIS — K588 Other irritable bowel syndrome: Secondary | ICD-10-CM | POA: Diagnosis not present

## 2018-04-11 DIAGNOSIS — R1013 Epigastric pain: Secondary | ICD-10-CM

## 2018-04-11 DIAGNOSIS — R1030 Lower abdominal pain, unspecified: Secondary | ICD-10-CM | POA: Diagnosis not present

## 2018-04-11 DIAGNOSIS — I10 Essential (primary) hypertension: Secondary | ICD-10-CM | POA: Diagnosis not present

## 2018-04-11 DIAGNOSIS — K56600 Partial intestinal obstruction, unspecified as to cause: Secondary | ICD-10-CM | POA: Diagnosis not present

## 2018-04-11 DIAGNOSIS — K5909 Other constipation: Secondary | ICD-10-CM | POA: Diagnosis not present

## 2018-04-11 LAB — BASIC METABOLIC PANEL
ANION GAP: 10 (ref 5–15)
BUN: 9 mg/dL (ref 6–20)
CALCIUM: 8.9 mg/dL (ref 8.9–10.3)
CO2: 23 mmol/L (ref 22–32)
CREATININE: 0.95 mg/dL (ref 0.44–1.00)
Chloride: 107 mmol/L (ref 101–111)
GFR calc non Af Amer: >60 mL/min (ref >60)
Glucose, Bld: 101 mg/dL — ABNORMAL HIGH (ref 65–99)
Potassium: 4.4 mmol/L (ref 3.5–5.1)
SODIUM: 140 mmol/L (ref 135–145)

## 2018-04-11 LAB — GLUCOSE, CAPILLARY: GLUCOSE-CAPILLARY: 124 mg/dL — AB (ref 65–99)

## 2018-04-11 LAB — HIV ANTIBODY (ROUTINE TESTING W REFLEX): HIV Screen 4th Generation wRfx: NONREACTIVE

## 2018-04-11 NOTE — Progress Notes (Signed)
PROGRESS NOTE                                                                                                                                                                                                             Patient Demographics:    [name], is a 57 y.o. female, DOB - 04/24/1961, SWF:[id number]  Admit date - 04/10/2018   Admitting Physician Pratik Darleen Crocker, DO  Outpatient Primary MD for the patient is Neale Burly, MD  LOS - 0  Outpatient Specialists: None  Chief Complaint  Patient presents with  . Abdominal Pain    n/v/d       Brief Narrative 57 year old female with history of hypothyroidism, GERD, hypertension, IBS and prior subtotal colectomy for complicated diverticulitis in 2004 resented with ongoing nausea and vomiting with epigastric and mid abdominal pain since 2 days prior to admission.  Patient reports very poor p.o. intake with ongoing pain.  Also had some loose bowel movement but no abdominal distention.  Pain worsened with attempting to eat or drink, no associated fevers, chills, hematemesis, melena or bright red blood per rectum. In the ED vitals were stable.  CT of the abdomen and pelvis with contrast showing low-grade obstruction versus focal ileus at the site of anastomosis of prior subtotal colectomy. Patient given IV fluids, antiemetics and placed on observation.   Subjective:   Felt better this am and wanted to have regular diet and go home. Had not passed flatus or BM yet. I started her on soft diet based on abd xray without obvious obstruction. She tolerated breakfast ok but following lunch developed cramping of abdomen.    Assessment  & Plan :   Principal problem Partial small bowel obstruction versus focal ileus Symptoms improved, follow up abd xray this am without obvious ileus or obstruction. could not tolerate soft diet, switch to clear liquid.   IV hydration, pain control  with PRN morphine and antiemetics. Serial abdominal exam.  Obtain follow-up x-ray and labs in a.m.  Potassium >4. If no improvement or worsening will ask surgery to evaluate in am.   Essential hypertension Stable.  Holding home medication  GERD Placed on IV PPI daily  Hypothyroidism Holding Synthroid as patient is n.p.o.  Diabetes mellitus type II Monitor CBG every 4 hours with sliding scale coverage.   Code Status :  Full code  Family Communication  : None at bedside  Disposition Plan  : Home tomorrow  if tolarating diet   Barriers For Discharge : Active symptoms  Consults  : None  Procedures  : CT abdomen  DVT Prophylaxis  :  Lovenox -   Lab Results  Component Value Date   PLT 226 04/10/2018    Antibiotics  :    Anti-infectives (From admission, onward)   None        Objective:   Vitals:   04/10/18 1408 04/10/18 2136 04/11/18 0532 04/11/18 1325  BP: 113/86 128/84 99/78 101/74  Pulse: 76 61 64 72  Resp: 18 18 16 18   Temp:  98.2 F (36.8 C) 98.2 F (36.8 C) 98.3 F (36.8 C)  TempSrc:  Oral Oral Oral  SpO2: 100% 100% 100% 98%  Weight:      Height:        Wt Readings from Last 3 Encounters:  04/09/18 65.8 kg (145 lb)  07/30/16 84 kg (185 lb 3.2 oz)  04/26/16 80.9 kg (178 lb 6.4 oz)     Intake/Output Summary (Last 24 hours) at 04/11/2018 1642 Last data filed at 04/11/2018 1200 Gross per 24 hour  Intake 360 ml  Output 950 ml  Net -590 ml     Physical Exam NAD  HEENT: moist mucosa, supple neck  chest: clear CVS: NS1S2 GI: soft, ND, BS+, Mid abdomen tenderness Musculoskeletal:warm, no edema    Data Review:    CBC Recent Labs  Lab 04/10/18 0209  WBC 7.7  HGB 14.6  HCT 43.7  PLT 226  MCV 91.2  MCH 30.5  MCHC 33.4  RDW 13.8    Chemistries  Recent Labs  Lab 04/10/18 0209 04/11/18 0616  NA 137 140  K 4.3 4.4  CL 102 107  CO2 24 23  GLUCOSE 107* 101*  BUN 17 9  CREATININE 1.05* 0.95  CALCIUM 9.3 8.9  AST 28  --   ALT  23  --   ALKPHOS 50  --   BILITOT 0.6  --    ------------------------------------------------------------------------------------------------------------------ No results for input(s): CHOL, HDL, LDLCALC, TRIG, CHOLHDL, LDLDIRECT in the last 72 hours.  Lab Results  Component Value Date   HGBA1C 6.4 (H) 06/03/2011   ------------------------------------------------------------------------------------------------------------------ No results for input(s): TSH, T4TOTAL, T3FREE, THYROIDAB in the last 72 hours.  Invalid input(s): FREET3 ------------------------------------------------------------------------------------------------------------------ No results for input(s): VITAMINB12, FOLATE, FERRITIN, TIBC, IRON, RETICCTPCT in the last 72 hours.  Coagulation profile No results for input(s): INR, PROTIME in the last 168 hours.  No results for input(s): DDIMER in the last 72 hours.  Cardiac Enzymes No results for input(s): CKMB, TROPONINI, MYOGLOBIN in the last 168 hours.  Invalid input(s): CK ------------------------------------------------------------------------------------------------------------------ No results found for: BNP  Inpatient Medications  Scheduled Meds: . dicyclomine  10 mg Oral TID AC & HS  . enoxaparin (LOVENOX) injection  40 mg Subcutaneous Q24H  . gabapentin  300 mg Oral BID  . imipramine  40 mg Oral QHS  . levothyroxine  25 mcg Oral QAC breakfast  . multivitamin with minerals   Oral Daily  . pantoprazole  40 mg Oral Daily  . QUEtiapine  50 mg Oral QHS   Continuous Infusions:  PRN Meds:.acetaminophen **OR** acetaminophen, morphine injection, ondansetron **OR** ondansetron (ZOFRAN) IV  Micro Results No results found for this or any previous visit (from the past 240 hour(s)).  Radiology Reports Ct Abdomen Pelvis W Contrast  Result Date: 04/10/2018 CLINICAL DATA:  Abdominal pain,  nausea, vomiting and diarrhea. Assess for diverticulitis. History of  diverticulitis, cholecystectomy, subtotal colectomy, appendectomy. EXAM: CT ABDOMEN AND PELVIS WITH CONTRAST TECHNIQUE: Multidetector CT imaging of the abdomen and pelvis was performed using the standard protocol following bolus administration of intravenous contrast. CONTRAST:  173mL ISOVUE-300 IOPAMIDOL (ISOVUE-300) INJECTION 61% COMPARISON:  CT abdomen and pelvis January 03, 2016 FINDINGS: LOWER CHEST: Bibasilar atelectasis. Included heart size is normal. No pericardial effusion. HEPATOBILIARY: Status post cholecystectomy. Minimal focal fatty infiltration about the falciform ligament, otherwise negative liver. PANCREAS: Normal. SPLEEN: Normal. ADRENALS/URINARY TRACT: Kidneys are orthotopic, demonstrating symmetric enhancement. No nephrolithiasis, hydronephrosis or solid renal masses. Too small to characterize hypodensities RIGHT kidney. The unopacified ureters are normal in course and caliber. Delayed imaging through the kidneys demonstrates symmetric prompt contrast excretion within the proximal urinary collecting system. Urinary bladder is partially distended and unremarkable. Normal adrenal glands. STOMACH/BOWEL: Status post subtotal colectomy at the level of the sigmoid colon. Dilated proximal small bowel at 3.5 cm with small bowel feces. Mild inflammatory changes about the anastomosis versus decompressed bowel. VASCULAR/LYMPHATIC: Aortoiliac vessels are normal in course and caliber. Trace calcific atherosclerosis no lymphadenopathy by CT size criteria. REPRODUCTIVE: Status post hysterectomy. OTHER: No intraperitoneal free fluid or free air. MUSCULOSKELETAL: Irregular sclerosis RIGHT femoral head and neck. Moderate lower lumbar facet arthropathy. Anterior abdominal wall scarring. IMPRESSION: 1. Status post subtotal colectomy with low-grade obstruction versus focal ileus at the level of the anastomosis. 2. RIGHT femoral head avascular necrosis/bone infarct without collapse. Aortic Atherosclerosis  (ICD10-I70.0). Electronically Signed   By: Elon Alas M.D.   On: 04/10/2018 04:36   Dg Abd Portable 2v  Result Date: 04/11/2018 CLINICAL DATA:  Partial small bowel obstruction. EXAM: PORTABLE ABDOMEN - 2 VIEW COMPARISON:  CT 04/10/2018. FINDINGS: Portable AP supine radiograph demonstrates a paucity of small bowel gas. Moderate stool burden in the rectosigmoid region. Surgical clips. I do not see a nasogastric tube. Degenerative change lumbar spine. IMPRESSION: Paucity of small bowel gas. Small bowel may be fluid-filled, but presence or absence of distention is difficult to assess on this portable supine radiograph. Stool and gas is noted in the rectosigmoid region. Consider supine and upright non-portable radiographs, performed in the radiology department, when the patient is stable. Electronically Signed   By: Staci Righter M.D.   On: 04/11/2018 08:07    Time Spent in minutes  25     M.D on 04/11/2018 at 4:42 PM  Between 7am to 7pm - Pager - [phone]  After 7pm go to [email or web address] - password [facility]  [facility] -  Office  [phone]

## 2018-04-12 ENCOUNTER — Observation Stay (HOSPITAL_COMMUNITY): Payer: Medicare HMO

## 2018-04-12 DIAGNOSIS — K5909 Other constipation: Secondary | ICD-10-CM

## 2018-04-12 DIAGNOSIS — K5900 Constipation, unspecified: Secondary | ICD-10-CM | POA: Diagnosis present

## 2018-04-12 DIAGNOSIS — R1030 Lower abdominal pain, unspecified: Secondary | ICD-10-CM | POA: Diagnosis not present

## 2018-04-12 DIAGNOSIS — K567 Ileus, unspecified: Secondary | ICD-10-CM | POA: Diagnosis present

## 2018-04-12 DIAGNOSIS — K588 Other irritable bowel syndrome: Secondary | ICD-10-CM

## 2018-04-12 DIAGNOSIS — R1013 Epigastric pain: Secondary | ICD-10-CM | POA: Diagnosis not present

## 2018-04-12 DIAGNOSIS — I10 Essential (primary) hypertension: Secondary | ICD-10-CM

## 2018-04-12 DIAGNOSIS — K449 Diaphragmatic hernia without obstruction or gangrene: Secondary | ICD-10-CM | POA: Diagnosis not present

## 2018-04-12 LAB — GLUCOSE, CAPILLARY: Glucose-Capillary: 112 mg/dL — ABNORMAL HIGH (ref 65–99)

## 2018-04-12 MED ORDER — ONDANSETRON HCL 4 MG PO TABS: 4.0000 mg | ORAL_TABLET | ORAL | Status: DC | PRN

## 2018-04-12 MED ORDER — BISACODYL 10 MG RE SUPP
10.0000 mg | Freq: Once | RECTAL | Status: AC
  Administered 2018-04-12: 10 mg via RECTAL
  Filled 2018-04-12: qty 1

## 2018-04-12 MED ORDER — POLYETHYLENE GLYCOL 3350 17 G PO PACK: 17.0000 g | PACK | Freq: Two times a day (BID) | ORAL | 0 refills | Status: DC

## 2018-04-12 MED ORDER — FLEET ENEMA 7-19 GM/118ML RE ENEM
1.0000 | ENEMA | Freq: Once | RECTAL | Status: AC
  Administered 2018-04-12: 1 via RECTAL

## 2018-04-12 MED ORDER — SENNOSIDES-DOCUSATE SODIUM 8.6-50 MG PO TABS: 2.0000 | ORAL_TABLET | Freq: Every evening | ORAL | 0 refills | Status: AC | PRN

## 2018-04-12 MED ORDER — ONDANSETRON HCL 4 MG/2ML IJ SOLN
4.0000 mg | INTRAMUSCULAR | Status: DC | PRN
  Administered 2018-04-12 (×2): 4 mg via INTRAVENOUS
  Filled 2018-04-12: qty 2

## 2018-04-12 NOTE — Discharge Instructions (Signed)

## 2018-04-12 NOTE — Progress Notes (Signed)
Patient discharged with personal belongings. IV removed and site intact. Patient discharged with printed prescriptions.

## 2018-04-12 NOTE — Discharge Summary (Signed)
Physician Discharge Summary  [name] HQI:[id number] DOB: 12/27/1960 DOA: 04/10/2018  PCP: Neale Burly, MD  Admit date: 04/10/2018 Discharge date: 04/12/2018  Admitted From: Home Disposition: Home  Recommendations for Outpatient Follow-up:  Follow-up with PCP in 1 week   Home Health: None Equipment/Devices: None  Discharge Condition: Fair CODE STATUS: Full code Diet recommendation: Regular    Discharge Diagnoses:  Principal Problem:   Lower abdominal pain   Active Problems:     Constipation   Ileus ([place])   Essential hypertension   GERD   IBS   NAUSEA AND VOMITING   SBO (small bowel obstruction) ([place])   Brief narrative/HPI 57 year old female with history of hypothyroidism, GERD, hypertension, IBS and prior subtotal colectomy for complicated diverticulitis in 2004 resented with ongoing nausea and vomiting with epigastric and mid abdominal pain since 2 days prior to admission.  Patient reports very poor p.o. intake with ongoing pain.  Also had some loose bowel movement but no abdominal distention.  Pain worsened with attempting to eat or drink, no associated fevers, chills, hematemesis, melena or bright red blood per rectum. In the ED vitals were stable.  CT of the abdomen and pelvis with contrast showing low-grade obstruction versus focal ileus at the site of anastomosis of prior subtotal colectomy. Patient given IV fluids, antiemetics and placed on observation.  Hospital course  Principal problem Focal ileus versus constipation due to IBS Symptoms improved.  Follow-up x-ray without any small bowel obstruction.  Patient reported several days of constipation.  Did not improve with MiraLAX so received an enema today with improvement. Tolerating advance diet.  I will discharge her on scheduled MiraLAX twice daily and Senokot at bedtime as needed.  Follow-up with PCP in 1 week.   Essential hypertension Stable.  Resume home medications.  GERD Resume  PPI  Hypothyroidism Resume Synthroid.  Diabetes mellitus type II Stable.  Resume metformin.   Family Communication  : None at bedside  Disposition Plan  : Home  Consults  : None  Procedures  : CT abdomen    Discharge Instructions   Allergies as of 04/12/2018      Reactions   Hydromorphone Hcl Itching   REACTION: Dilaudid      Medication List    STOP taking these medications   omeprazole 20 MG capsule Commonly known as:  PRILOSEC     TAKE these medications   dicyclomine 10 MG capsule Commonly known as:  BENTYL Take 10 mg by mouth 4 (four) times daily -  before meals and at bedtime.   esomeprazole 20 MG capsule Commonly known as:  NEXIUM Take 1 capsule (20 mg total) by mouth 2 (two) times daily before a meal.   Fish Oil 1200 MG Caps Take 1 capsule by mouth 2 (two) times daily.   gabapentin 300 MG capsule Commonly known as:  NEURONTIN Take 300 mg by mouth 2 (two) times daily.   imipramine 10 MG tablet Commonly known as:  TOFRANIL Take 40 mg by mouth at bedtime.   levothyroxine 25 MCG tablet Commonly known as:  SYNTHROID, LEVOTHROID Take 25 mcg by mouth daily.   metFORMIN 500 MG tablet Commonly known as:  GLUCOPHAGE Take 500 mg by mouth daily.   NAPROXEN PO Take 50 mg by mouth as needed.   ONE-A-DAY WOMENS FORMULA PO Take 1 tablet by mouth daily. Alternating each week with Caltrate w/D   polyethylene glycol packet Commonly known as:  MIRALAX Take 17 g by mouth 2 (two) times daily.  QUEtiapine 50 MG tablet Commonly known as:  SEROQUEL Take 50 mg by mouth at bedtime.   ranitidine 300 MG tablet Commonly known as:  ZANTAC Take 300 mg by mouth at bedtime.   senna-docusate 8.6-50 MG tablet Commonly known as:  Senokot-S Take 2 tablets by mouth at bedtime as needed for mild constipation.   simvastatin 10 MG tablet Commonly known as:  ZOCOR Take 10 mg by mouth daily.   spironolactone 50 MG tablet Commonly known as:  ALDACTONE Take 50  mg by mouth 2 (two) times daily.      Follow-up Information    Neale Burly, MD. Schedule an appointment as soon as possible for a visit in 1 week(s).   Specialty:  Internal Medicine Contact information: [place] [id number] [phone]          Allergies  Allergen Reactions  . Hydromorphone Hcl Itching    REACTION: Dilaudid       Procedures/Studies: Ct Abdomen Pelvis W Contrast  Result Date: 04/10/2018 CLINICAL DATA:  Abdominal pain, nausea, vomiting and diarrhea. Assess for diverticulitis. History of diverticulitis, cholecystectomy, subtotal colectomy, appendectomy. EXAM: CT ABDOMEN AND PELVIS WITH CONTRAST TECHNIQUE: Multidetector CT imaging of the abdomen and pelvis was performed using the standard protocol following bolus administration of intravenous contrast. CONTRAST:  184mL ISOVUE-300 IOPAMIDOL (ISOVUE-300) INJECTION 61% COMPARISON:  CT abdomen and pelvis January 03, 2016 FINDINGS: LOWER CHEST: Bibasilar atelectasis. Included heart size is normal. No pericardial effusion. HEPATOBILIARY: Status post cholecystectomy. Minimal focal fatty infiltration about the falciform ligament, otherwise negative liver. PANCREAS: Normal. SPLEEN: Normal. ADRENALS/URINARY TRACT: Kidneys are orthotopic, demonstrating symmetric enhancement. No nephrolithiasis, hydronephrosis or solid renal masses. Too small to characterize hypodensities RIGHT kidney. The unopacified ureters are normal in course and caliber. Delayed imaging through the kidneys demonstrates symmetric prompt contrast excretion within the proximal urinary collecting system. Urinary bladder is partially distended and unremarkable. Normal adrenal glands. STOMACH/BOWEL: Status post subtotal colectomy at the level of the sigmoid colon. Dilated proximal small bowel at 3.5 cm with small bowel feces. Mild inflammatory changes about the anastomosis versus decompressed bowel. VASCULAR/LYMPHATIC: Aortoiliac vessels are normal  in course and caliber. Trace calcific atherosclerosis no lymphadenopathy by CT size criteria. REPRODUCTIVE: Status post hysterectomy. OTHER: No intraperitoneal free fluid or free air. MUSCULOSKELETAL: Irregular sclerosis RIGHT femoral head and neck. Moderate lower lumbar facet arthropathy. Anterior abdominal wall scarring. IMPRESSION: 1. Status post subtotal colectomy with low-grade obstruction versus focal ileus at the level of the anastomosis. 2. RIGHT femoral head avascular necrosis/bone infarct without collapse. Aortic Atherosclerosis (ICD10-I70.0). Electronically Signed   By: Elon Alas M.D.   On: 04/10/2018 04:36   Dg Abd Portable 2v  Result Date: 04/12/2018 CLINICAL DATA:  Nausea. History of hypertension and hiatal hernia. Ileus. EXAM: PORTABLE ABDOMEN - 2 VIEW COMPARISON:  04/11/2018 FINDINGS: There is no bowel dilation to suggest obstruction. No air-fluid levels to suggest a significant adynamic ileus. There is no free air. Multiple surgical vascular clips are noted throughout the abdomen and the pelvis. No evidence of a renal or ureteral stone. Soft tissues otherwise unremarkable. IMPRESSION: 1. No radiographic evidence of bowel obstruction or significant adynamic ileus. No free air. Electronically Signed   By: Lajean Manes M.D.   On: 04/12/2018 14:18   Dg Abd Portable 2v  Result Date: 04/11/2018 CLINICAL DATA:  Partial small bowel obstruction. EXAM: PORTABLE ABDOMEN - 2 VIEW COMPARISON:  CT 04/10/2018. FINDINGS: Portable AP supine radiograph demonstrates a paucity of small bowel gas.  Moderate stool burden in the rectosigmoid region. Surgical clips. I do not see a nasogastric tube. Degenerative change lumbar spine. IMPRESSION: Paucity of small bowel gas. Small bowel may be fluid-filled, but presence or absence of distention is difficult to assess on this portable supine radiograph. Stool and gas is noted in the rectosigmoid region. Consider supine and upright non-portable radiographs,  performed in the radiology department, when the patient is stable. Electronically Signed   By: Staci Righter M.D.   On: 04/11/2018 08:07       Subjective: Abdominal pain better after having bowel movement following enema.  No nausea vomiting.  Tolerating advance diet.  Discharge Exam: Vitals:   04/12/18 0608 04/12/18 1315  BP: 112/87 102/77  Pulse: 68 77  Resp: 18 20  Temp: 98.3 F (36.8 C) 98.6 F (37 C)  SpO2: 99% 99%   Vitals:   04/11/18 1325 04/11/18 2152 04/12/18 0608 04/12/18 1315  BP: 101/74 (!) 95/52 112/87 102/77  Pulse: 72 71 68 77  Resp: 18 20 18 20   Temp: 98.3 F (36.8 C) 97.9 F (36.6 C) 98.3 F (36.8 C) 98.6 F (37 C)  TempSrc: Oral Oral Oral Oral  SpO2: 98% 100% 99% 99%  Weight:      Height:        General: Not in distress HEENT: Moist mucosa, supple neck Chest: Clear bilaterally CVS: Normal S1 and S2, no murmurs GI: Soft, nondistended, bowel sounds present, lower abdominal tenderness much improved Musculoskeletal: Warm, no edema    The results of significant diagnostics from this hospitalization (including imaging, microbiology, ancillary and laboratory) are listed below for reference.     Microbiology: No results found for this or any previous visit (from the past 240 hour(s)).   Labs: BNP (last 3 results) No results for input(s): BNP in the last 8760 hours. Basic Metabolic Panel: Recent Labs  Lab 04/10/18 0209 04/11/18 0616  NA 137 140  K 4.3 4.4  CL 102 107  CO2 24 23  GLUCOSE 107* 101*  BUN 17 9  CREATININE 1.05* 0.95  CALCIUM 9.3 8.9   Liver Function Tests: Recent Labs  Lab 04/10/18 0209  AST 28  ALT 23  ALKPHOS 50  BILITOT 0.6  PROT 7.8  ALBUMIN 4.2   Recent Labs  Lab 04/10/18 0209  LIPASE 31   No results for input(s): AMMONIA in the last 168 hours. CBC: Recent Labs  Lab 04/10/18 0209  WBC 7.7  HGB 14.6  HCT 43.7  MCV 91.2  PLT 226   Cardiac Enzymes: No results for input(s): CKTOTAL, CKMB, CKMBINDEX,  TROPONINI in the last 168 hours. BNP: Invalid input(s): POCBNP CBG: Recent Labs  Lab 04/10/18 0905 04/11/18 0715 04/12/18 0712  GLUCAP 122* 124* 112*   D-Dimer No results for input(s): DDIMER in the last 72 hours. Hgb A1c No results for input(s): HGBA1C in the last 72 hours. Lipid Profile No results for input(s): CHOL, HDL, LDLCALC, TRIG, CHOLHDL, LDLDIRECT in the last 72 hours. Thyroid function studies No results for input(s): TSH, T4TOTAL, T3FREE, THYROIDAB in the last 72 hours.  Invalid input(s): FREET3 Anemia work up No results for input(s): VITAMINB12, FOLATE, FERRITIN, TIBC, IRON, RETICCTPCT in the last 72 hours. Urinalysis    Component Value Date/Time   COLORURINE YELLOW 04/10/2018 0142   APPEARANCEUR HAZY (A) 04/10/2018 0142   LABSPEC 1.019 04/10/2018 0142   PHURINE 5.0 04/10/2018 0142   GLUCOSEU NEGATIVE 04/10/2018 0142   GLUCOSEU NEG mg/dL 11/30/2010 2046   HGBUR MODERATE (A)  04/10/2018 0142   BILIRUBINUR NEGATIVE 04/10/2018 0142   KETONESUR NEGATIVE 04/10/2018 0142   PROTEINUR NEGATIVE 04/10/2018 0142   UROBILINOGEN 0.2 04/09/2011 1208   NITRITE NEGATIVE 04/10/2018 0142   LEUKOCYTESUR NEGATIVE 04/10/2018 0142   Sepsis Labs Invalid input(s): PROCALCITONIN,  WBC,  LACTICIDVEN Microbiology No results found for this or any previous visit (from the past 240 hour(s)).   Time coordinating discharge:< 30 minutes  SIGNED:   Louellen Molder, MD  [facility] 04/12/2018, 4:07 PM Pager   If 7PM-7AM, please contact night-coverage [email or web address] Password [email or web address]

## 2018-04-21 DIAGNOSIS — K2900 Acute gastritis without bleeding: Secondary | ICD-10-CM | POA: Diagnosis not present

## 2018-04-21 DIAGNOSIS — Z6832 Body mass index (BMI) 32.0-32.9, adult: Secondary | ICD-10-CM | POA: Diagnosis not present

## 2018-05-12 DIAGNOSIS — J3089 Other allergic rhinitis: Secondary | ICD-10-CM | POA: Diagnosis not present

## 2018-05-12 DIAGNOSIS — M62830 Muscle spasm of back: Secondary | ICD-10-CM | POA: Diagnosis not present

## 2018-05-12 DIAGNOSIS — Z6832 Body mass index (BMI) 32.0-32.9, adult: Secondary | ICD-10-CM | POA: Diagnosis not present

## 2018-08-20 DIAGNOSIS — Z0000 Encounter for general adult medical examination without abnormal findings: Secondary | ICD-10-CM | POA: Diagnosis not present

## 2018-08-20 DIAGNOSIS — E119 Type 2 diabetes mellitus without complications: Secondary | ICD-10-CM | POA: Diagnosis not present

## 2018-08-20 DIAGNOSIS — Z6832 Body mass index (BMI) 32.0-32.9, adult: Secondary | ICD-10-CM | POA: Diagnosis not present

## 2018-08-20 DIAGNOSIS — M62830 Muscle spasm of back: Secondary | ICD-10-CM | POA: Diagnosis not present

## 2018-08-20 DIAGNOSIS — E038 Other specified hypothyroidism: Secondary | ICD-10-CM | POA: Diagnosis not present

## 2018-08-20 DIAGNOSIS — J3089 Other allergic rhinitis: Secondary | ICD-10-CM | POA: Diagnosis not present

## 2018-08-20 DIAGNOSIS — E782 Mixed hyperlipidemia: Secondary | ICD-10-CM | POA: Diagnosis not present

## 2018-09-06 ENCOUNTER — Encounter (HOSPITAL_COMMUNITY): Payer: Self-pay | Admitting: Emergency Medicine

## 2018-09-06 ENCOUNTER — Emergency Department (HOSPITAL_COMMUNITY): Payer: Medicare HMO

## 2018-09-06 ENCOUNTER — Other Ambulatory Visit: Payer: Self-pay

## 2018-09-06 ENCOUNTER — Emergency Department (HOSPITAL_COMMUNITY)
Admission: EM | Admit: 2018-09-06 | Discharge: 2018-09-06 | Disposition: A | Discharge status: Home / Self Care | Payer: Medicare HMO | Attending: Emergency Medicine | Admitting: Emergency Medicine

## 2018-09-06 DIAGNOSIS — J45909 Unspecified asthma, uncomplicated: Secondary | ICD-10-CM | POA: Diagnosis not present

## 2018-09-06 DIAGNOSIS — I10 Essential (primary) hypertension: Secondary | ICD-10-CM | POA: Insufficient documentation

## 2018-09-06 DIAGNOSIS — F419 Anxiety disorder, unspecified: Secondary | ICD-10-CM | POA: Diagnosis not present

## 2018-09-06 DIAGNOSIS — Z87891 Personal history of nicotine dependence: Secondary | ICD-10-CM | POA: Insufficient documentation

## 2018-09-06 DIAGNOSIS — M79672 Pain in left foot: Secondary | ICD-10-CM | POA: Insufficient documentation

## 2018-09-06 DIAGNOSIS — F329 Major depressive disorder, single episode, unspecified: Secondary | ICD-10-CM | POA: Diagnosis not present

## 2018-09-06 DIAGNOSIS — Z79899 Other long term (current) drug therapy: Secondary | ICD-10-CM | POA: Insufficient documentation

## 2018-09-06 DIAGNOSIS — E039 Hypothyroidism, unspecified: Secondary | ICD-10-CM | POA: Insufficient documentation

## 2018-09-06 DIAGNOSIS — Z9049 Acquired absence of other specified parts of digestive tract: Secondary | ICD-10-CM | POA: Insufficient documentation

## 2018-09-06 MED ORDER — INDOMETHACIN 25 MG PO CAPS: 25.0000 mg | ORAL_CAPSULE | Freq: Three times a day (TID) | ORAL | 0 refills | Status: DC | PRN

## 2018-09-06 MED ORDER — HYDROCODONE-ACETAMINOPHEN 5-325 MG PO TABS: 2.0000 | ORAL_TABLET | ORAL | 0 refills | Status: DC | PRN

## 2018-09-06 NOTE — ED Triage Notes (Signed)
Pt c/o L. Foot pain since 1930 yesterday. Pt denies any injury.

## 2018-09-06 NOTE — Discharge Instructions (Addendum)
Indomethacin as prescribed.  Hydrocodone is prescribed as needed for pain.  Follow-up with your primary doctor if symptoms are not improving in the next few days, and return to the ER if symptoms significantly worsen or change in the meantime.

## 2018-09-06 NOTE — ED Provider Notes (Signed)
[facility] EMERGENCY DEPARTMENT Provider Note   CSN: [id number] Arrival date & time: 09/06/18  0100     History   Chief Complaint Chief Complaint  Patient presents with  . Foot Pain    HPI [name] is a 57 y.o. female.  Patient is a 58 year old female with past medical history of hypertension, irritable bowel, migraines, anxiety, and chronic back pain.  She presents today for evaluation of left foot pain.  This began yesterday while she was at work.  She denies any specific injury or trauma.  She denies any fevers or chills.  The history is provided by the patient.  Foot Pain  This is a new problem. The current episode started yesterday. The problem occurs constantly. The problem has been rapidly worsening. The symptoms are aggravated by walking. Nothing relieves the symptoms. She has tried nothing for the symptoms.    Past Medical History:  Diagnosis Date  . Anxiety   . Asthma   . Chronic back pain   . Depression   . Diverticulitis    complicated by abscess requiring total colectomy and take-down of colostomy in 2004  . GERD (gastroesophageal reflux disease)   . Hemorrhoids   . Hiatal hernia   . HTN (hypertension)   . Hypothyroidism   . IBS (irritable bowel syndrome)   . Migraine   . Neck pain, chronic   . Obesity   . Polyp of colon, hyperplastic   . [facility] spotted fever   . Terminal ileitis of small intestine ([place])    referred to [facility], Crohn's ruled out  . Thyroid disease     Patient Active Problem List   Diagnosis Date Noted  . Lower abdominal pain 04/12/2018  . Constipation 04/12/2018  . Ileus ([place]) 04/12/2018  . SBO (small bowel obstruction) ([place]) 04/10/2018  . Rectal polyp   . Hiatal hernia   . Nausea without vomiting 01/15/2016  . Rectal bleeding 01/15/2016  . Suprapubic pain 04/09/2011  . Abnormal CT of the abdomen 04/09/2011  . Weight loss, abnormal 04/09/2011  . DYSPHAGIA UNSPECIFIED 07/18/2010  . NAUSEA AND VOMITING  07/04/2010  . HYPERLIPIDEMIA 03/02/2010  . DYSPNEA 03/02/2010  . GI BLEEDING 11/23/2009  . [place] DISEASE, LUMBOSACRAL SPINE 08/03/2009  . SPINAL STENOSIS 07/24/2009  . SCIATICA 07/03/2009  . ASEPTIC NECROSIS 07/03/2009  . ILEITIS 06/13/2009  . ANEMIA, NORMOCYTIC 03/27/2009  . IBS 03/27/2009  . FATTY LIVER DISEASE 03/27/2009  . NECK PAIN, CHRONIC 03/27/2009  . DIARRHEA 03/27/2009  . ABDOMINAL PAIN, CHRONIC 03/27/2009  . ANXIETY 03/24/2009  . ALCOHOL USE 03/24/2009  . SMOKER 03/24/2009  . Essential hypertension 03/24/2009  . Internal hemorrhoids 03/24/2009  . SCHATZKI'S RING 03/24/2009  . GERD 03/24/2009  . DIVERTICULITIS, HX OF 03/24/2009    Past Surgical History:  Procedure Laterality Date  . APPENDECTOMY    . CHOLECYSTECTOMY  2004   at time of colectomy  . COLONOSCOPY  03/2009   anastomotic ulcers and neoterminal ileum ulcers, anastomosis with SB at 30cm. bx s/o Crohn's  . COLONOSCOPY  2005   Dr. Irving Shows: rectal ulcers  . COLONOSCOPY  2011   [facility]  . COLONOSCOPY N/A 01/30/2016   Dr.Rourk- internal hemorrhoids, likely source of hematochezia, rectal polyps removed s/p subtotal colectomy with normal appearing residual colonic and small bowel mucosa bx= hyperplasticpolyp  . ESOPHAGOGASTRODUODENOSCOPY  02/2009   mild erosive reflux esophagitis  . ESOPHAGOGASTRODUODENOSCOPY  07/2010   probable cervical esophageal web s/p dilation  . ESOPHAGOGASTRODUODENOSCOPY N/A 01/30/2016  Dr.Rourk- normal apearing patent tubular esophagus, hiatal hernia  . ethmoid mucocele, turbinate surgery  2002   right  . GIVENS CAPSULE STUDY  11/2009   Dr. Gala Romney: subtle erytherma and edema of TI  . HEMORRHOID BANDING  2017   Dr.Rourk  . hysterectomy  2009   partial  . LIVER BIOPSY  2004   steatosis, at time of colectomy  . SUBTOTAL COLECTOMY  2004   diverticulitis with abscess  . UMBILICAL HERNIA REPAIR       OB History   None      Home Medications    Prior to Admission  medications   Medication Sig Start Date End Date Taking? Authorizing Provider  dicyclomine (BENTYL) 10 MG capsule Take 10 mg by mouth 4 (four) times daily -  before meals and at bedtime.     Yes [provider]  esomeprazole (NEXIUM) 20 MG capsule Take 1 capsule (20 mg total) by mouth 2 (two) times daily before a meal. 04/26/16  Yes Rourk, Cristopher Estimable, MD  gabapentin (NEURONTIN) 300 MG capsule Take 300 mg by mouth 2 (two) times daily.     Yes [provider]  imipramine (TOFRANIL) 10 MG tablet Take 40 mg by mouth at bedtime.     Yes [provider]  levothyroxine (SYNTHROID, LEVOTHROID) 25 MCG tablet Take 25 mcg by mouth daily. 07/05/16  Yes [provider]  metFORMIN (GLUCOPHAGE) 500 MG tablet Take 500 mg by mouth daily.   Yes [provider]  Multiple Vitamins-Calcium (ONE-A-DAY WOMENS FORMULA PO) Take 1 tablet by mouth daily. Alternating each week with Caltrate w/D    Yes [provider]  NAPROXEN PO Take 50 mg by mouth as needed.   Yes [provider]  Omega-3 Fatty Acids (FISH OIL) 1200 MG CAPS Take 1 capsule by mouth 2 (two) times daily.   Yes [provider]  polyethylene glycol (MIRALAX) packet Take 17 g by mouth 2 (two) times daily. 04/12/18  Yes Dhungel, Nishant, MD  QUEtiapine (SEROQUEL) 50 MG tablet Take 50 mg by mouth at bedtime.     Yes [provider]  ranitidine (ZANTAC) 300 MG tablet Take 300 mg by mouth at bedtime.   Yes [provider]  senna-docusate (SENOKOT-S) 8.6-50 MG tablet Take 2 tablets by mouth at bedtime as needed for mild constipation. 04/12/18 04/12/19 Yes Dhungel, Nishant, MD  simvastatin (ZOCOR) 10 MG tablet Take 10 mg by mouth daily. 07/05/16  Yes [provider]  spironolactone (ALDACTONE) 50 MG tablet Take 50 mg by mouth 2 (two) times daily.    Yes [provider]    Family History Family History  Problem Relation Age of Onset  . ALS Father   . Crohn's disease  Daughter   . Diabetes Mother   . Rheum arthritis Mother     Social History Social History   Tobacco Use  . Smoking status: Former Smoker    Packs/day: 0.50    Years: 10.00    Pack years: 5.00    Types: Cigarettes    Last attempt to quit: 12/15/2006    Years since quitting: 11.7  . Smokeless tobacco: Never Used  Substance Use Topics  . Alcohol use: Yes    Alcohol/week: 0.0 standard drinks  . Drug use: No     Allergies   Hydromorphone hcl   Review of Systems Review of Systems  All other systems reviewed and are negative.    Physical Exam Updated Vital Signs BP 124/84 (BP  Location: Left Arm)   Pulse 73   Temp 98.1 F (36.7 C) (Oral)   Resp 18   Ht 5\' 3"  (1.6 m)   Wt 86.6 kg   SpO2 100%   BMI 33.83 kg/m   Physical Exam  Constitutional: She is oriented to person, place, and time. She appears well-developed and well-nourished. No distress.  HENT:  Head: Normocephalic and atraumatic.  Neck: Normal range of motion. Neck supple.  Pulmonary/Chest: Effort normal.  Musculoskeletal:  The left foot appears grossly normal.  There is no significant swelling or deformity.  There is ttp and mild warmth over the dorsum of the foot and pain with range of motion of the ankle.    Neurological: She is alert and oriented to person, place, and time.  Skin: She is not diaphoretic.  Nursing note and vitals reviewed.    ED Treatments / Results  Labs (all labs ordered are listed, but only abnormal results are displayed) Labs Reviewed - No data to display  EKG None  Radiology No results found.  Procedures Procedures (including critical care time)  Medications Ordered in ED Medications - No data to display   Initial Impression / Assessment and Plan / ED Course  I have reviewed the triage vital signs and the nursing notes.  Pertinent labs & imaging results that were available during my care of the patient were reviewed by me and considered in my medical decision  making (see chart for details).  Patient presents with pain in the left foot and ankle in the absence of any injury or trauma.  There is some warmth to the ankle and pain with range of motion.  She has no personal history of gout, however this does run in her family.  I suspect that this is the cause of her pain.  This will be treated with indomethacin, pain medication, and she is to follow-up as needed.  I have also considered a septic joint, however I doubt this to be the case.  She has no fever.  Final Clinical Impressions(s) / ED Diagnoses   Final diagnoses:  None    ED Discharge Orders    None       Veryl Speak, MD 09/06/18 0202

## 2018-11-23 DIAGNOSIS — E119 Type 2 diabetes mellitus without complications: Secondary | ICD-10-CM | POA: Diagnosis not present

## 2018-11-23 DIAGNOSIS — Z6833 Body mass index (BMI) 33.0-33.9, adult: Secondary | ICD-10-CM | POA: Diagnosis not present

## 2018-11-23 DIAGNOSIS — I10 Essential (primary) hypertension: Secondary | ICD-10-CM | POA: Diagnosis not present

## 2018-11-23 DIAGNOSIS — K210 Gastro-esophageal reflux disease with esophagitis: Secondary | ICD-10-CM | POA: Diagnosis not present

## 2018-11-23 DIAGNOSIS — E039 Hypothyroidism, unspecified: Secondary | ICD-10-CM | POA: Diagnosis not present

## 2018-11-23 DIAGNOSIS — E785 Hyperlipidemia, unspecified: Secondary | ICD-10-CM | POA: Diagnosis not present

## 2018-12-18 DIAGNOSIS — H5201 Hypermetropia, right eye: Secondary | ICD-10-CM | POA: Diagnosis not present

## 2018-12-18 DIAGNOSIS — H5212 Myopia, left eye: Secondary | ICD-10-CM | POA: Diagnosis not present

## 2018-12-18 DIAGNOSIS — H52223 Regular astigmatism, bilateral: Secondary | ICD-10-CM | POA: Diagnosis not present

## 2018-12-18 DIAGNOSIS — H524 Presbyopia: Secondary | ICD-10-CM | POA: Diagnosis not present

## 2019-03-01 DIAGNOSIS — I10 Essential (primary) hypertension: Secondary | ICD-10-CM | POA: Diagnosis not present

## 2019-03-01 DIAGNOSIS — E785 Hyperlipidemia, unspecified: Secondary | ICD-10-CM | POA: Diagnosis not present

## 2019-03-01 DIAGNOSIS — E119 Type 2 diabetes mellitus without complications: Secondary | ICD-10-CM | POA: Diagnosis not present

## 2019-03-01 DIAGNOSIS — K210 Gastro-esophageal reflux disease with esophagitis: Secondary | ICD-10-CM | POA: Diagnosis not present

## 2019-03-01 DIAGNOSIS — E039 Hypothyroidism, unspecified: Secondary | ICD-10-CM | POA: Diagnosis not present

## 2019-03-01 DIAGNOSIS — Z0000 Encounter for general adult medical examination without abnormal findings: Secondary | ICD-10-CM | POA: Diagnosis not present

## 2019-04-28 DIAGNOSIS — R928 Other abnormal and inconclusive findings on diagnostic imaging of breast: Secondary | ICD-10-CM | POA: Diagnosis not present

## 2019-04-28 DIAGNOSIS — N6311 Unspecified lump in the right breast, upper outer quadrant: Secondary | ICD-10-CM | POA: Diagnosis not present

## 2019-04-28 DIAGNOSIS — N6489 Other specified disorders of breast: Secondary | ICD-10-CM | POA: Diagnosis not present

## 2019-06-01 DIAGNOSIS — E039 Hypothyroidism, unspecified: Secondary | ICD-10-CM | POA: Diagnosis not present

## 2019-06-01 DIAGNOSIS — I10 Essential (primary) hypertension: Secondary | ICD-10-CM | POA: Diagnosis not present

## 2019-06-01 DIAGNOSIS — Z0000 Encounter for general adult medical examination without abnormal findings: Secondary | ICD-10-CM | POA: Diagnosis not present

## 2019-06-01 DIAGNOSIS — E785 Hyperlipidemia, unspecified: Secondary | ICD-10-CM | POA: Diagnosis not present

## 2019-06-01 DIAGNOSIS — E119 Type 2 diabetes mellitus without complications: Secondary | ICD-10-CM | POA: Diagnosis not present

## 2019-06-22 ENCOUNTER — Encounter (HOSPITAL_COMMUNITY): Payer: Self-pay | Admitting: *Deleted

## 2019-06-22 ENCOUNTER — Emergency Department (HOSPITAL_COMMUNITY)
Admission: EM | Admit: 2019-06-22 | Discharge: 2019-06-22 | Disposition: A | Discharge status: Home / Self Care | Payer: Medicare Other | Attending: Emergency Medicine | Admitting: Emergency Medicine

## 2019-06-22 ENCOUNTER — Other Ambulatory Visit: Payer: Self-pay

## 2019-06-22 ENCOUNTER — Emergency Department (HOSPITAL_COMMUNITY): Payer: Medicare Other

## 2019-06-22 DIAGNOSIS — M545 Low back pain: Secondary | ICD-10-CM | POA: Diagnosis present

## 2019-06-22 DIAGNOSIS — I10 Essential (primary) hypertension: Secondary | ICD-10-CM | POA: Insufficient documentation

## 2019-06-22 DIAGNOSIS — Z87891 Personal history of nicotine dependence: Secondary | ICD-10-CM | POA: Insufficient documentation

## 2019-06-22 DIAGNOSIS — J45909 Unspecified asthma, uncomplicated: Secondary | ICD-10-CM | POA: Diagnosis not present

## 2019-06-22 DIAGNOSIS — Z79899 Other long term (current) drug therapy: Secondary | ICD-10-CM | POA: Diagnosis not present

## 2019-06-22 DIAGNOSIS — M5441 Lumbago with sciatica, right side: Secondary | ICD-10-CM | POA: Diagnosis not present

## 2019-06-22 DIAGNOSIS — R109 Unspecified abdominal pain: Secondary | ICD-10-CM | POA: Insufficient documentation

## 2019-06-22 DIAGNOSIS — M5431 Sciatica, right side: Secondary | ICD-10-CM

## 2019-06-22 DIAGNOSIS — E039 Hypothyroidism, unspecified: Secondary | ICD-10-CM | POA: Insufficient documentation

## 2019-06-22 LAB — URINALYSIS, ROUTINE W REFLEX MICROSCOPIC
Bilirubin Urine: NEGATIVE
Glucose, UA: NEGATIVE mg/dL
Ketones, ur: NEGATIVE mg/dL
Leukocytes,Ua: NEGATIVE
Nitrite: NEGATIVE
Protein, ur: NEGATIVE mg/dL
Specific Gravity, Urine: 1.011 (ref 1.005–1.030)
pH: 6 (ref 5.0–8.0)

## 2019-06-22 LAB — CBC WITH DIFFERENTIAL/PLATELET
Abs Immature Granulocytes: 0.01 10*3/uL (ref 0.00–0.07)
Basophils Absolute: 0 10*3/uL (ref 0.0–0.1)
Basophils Relative: 1 %
Eosinophils Absolute: 0.2 10*3/uL (ref 0.0–0.5)
Eosinophils Relative: 4 %
HCT: 41.9 % (ref 36.0–46.0)
Hemoglobin: 13.5 g/dL (ref 12.0–15.0)
Immature Granulocytes: 0 %
Lymphocytes Relative: 43 %
Lymphs Abs: 2.7 10*3/uL (ref 0.7–4.0)
MCH: 30.3 pg (ref 26.0–34.0)
MCHC: 32.2 g/dL (ref 30.0–36.0)
MCV: 94.2 fL (ref 80.0–100.0)
Monocytes Absolute: 0.3 10*3/uL (ref 0.1–1.0)
Monocytes Relative: 5 %
Neutro Abs: 3 10*3/uL (ref 1.7–7.7)
Neutrophils Relative %: 47 %
Platelets: 186 10*3/uL (ref 150–400)
RBC: 4.45 MIL/uL (ref 3.87–5.11)
RDW: 14.6 % (ref 11.5–15.5)
WBC: 6.3 10*3/uL (ref 4.0–10.5)
nRBC: 0 % (ref 0.0–0.2)

## 2019-06-22 LAB — BASIC METABOLIC PANEL
Anion gap: 11 (ref 5–15)
BUN: 12 mg/dL (ref 6–20)
CO2: 24 mmol/L (ref 22–32)
Calcium: 9.2 mg/dL (ref 8.9–10.3)
Chloride: 109 mmol/L (ref 98–111)
Creatinine, Ser: 1.07 mg/dL — ABNORMAL HIGH (ref 0.44–1.00)
GFR calc Af Amer: >60 mL/min (ref >60)
GFR calc non Af Amer: 57 mL/min — ABNORMAL LOW (ref >60)
Glucose, Bld: 106 mg/dL — ABNORMAL HIGH (ref 70–99)
Potassium: 4.3 mmol/L (ref 3.5–5.1)
Sodium: 144 mmol/L (ref 135–145)

## 2019-06-22 MED ORDER — HYDROCODONE-ACETAMINOPHEN 5-325 MG PO TABS: ORAL_TABLET | ORAL | 0 refills | Status: DC

## 2019-06-22 MED ORDER — CYCLOBENZAPRINE HCL 10 MG PO TABS: 10.0000 mg | ORAL_TABLET | Freq: Three times a day (TID) | ORAL | 0 refills | Status: DC | PRN

## 2019-06-22 MED ORDER — HYDROCODONE-ACETAMINOPHEN 5-325 MG PO TABS
1.0000 | ORAL_TABLET | Freq: Once | ORAL | Status: AC
  Administered 2019-06-22: 11:00:00 1 via ORAL
  Filled 2019-06-22: qty 1

## 2019-06-22 MED ORDER — CYCLOBENZAPRINE HCL 10 MG PO TABS
10.0000 mg | ORAL_TABLET | Freq: Once | ORAL | Status: AC
  Administered 2019-06-22: 10 mg via ORAL
  Filled 2019-06-22: qty 1

## 2019-06-22 NOTE — Discharge Instructions (Addendum)
Alternate ice and heat to your lower back.  Avoid bending over or twisting movements for at least 1 week.  Take medication as directed.  Follow-up with your primary doctor for recheck.

## 2019-06-22 NOTE — ED Triage Notes (Signed)
Right flank pain for for 10 days getting worse.

## 2019-06-22 NOTE — ED Provider Notes (Signed)
[facility] EMERGENCY DEPARTMENT Provider Note   CSN: [id number] Arrival date & time: 06/22/19  5400     History   Chief Complaint Chief Complaint  Patient presents with  . Flank Pain    HPI [name] is a 58 y.o. female.     HPI   [name] is a 58 y.o. female who presents to the Emergency Department complaining of gradually worsening right-sided flank and low back pain.  Symptoms have been present for 10 days.  She describes a dull aching pain to her right low back that is associated with certain movements.  Pain improves at rest.  She states that at times the pain radiates into her thigh.  Pain appears to be worse when sitting.  She denies known injury, but states that she has been taking care of her frail husband.  She denies burning with urination, hematuria, abdominal or pelvic pain, nausea or vomiting, fever or chills.  No numbness, or weakness of the lower extremities, no retention or incontinence of urine or bowel.  She has not tried any medications or therapies prior to arrival. No hx of kidney stones    Past Medical History:  Diagnosis Date  . Anxiety   . Asthma   . Chronic back pain   . Depression   . Diverticulitis    complicated by abscess requiring total colectomy and take-down of colostomy in 2004  . GERD (gastroesophageal reflux disease)   . Hemorrhoids   . Hiatal hernia   . HTN (hypertension)   . Hypothyroidism   . IBS (irritable bowel syndrome)   . Migraine   . Neck pain, chronic   . Obesity   . Polyp of colon, hyperplastic   . [facility] spotted fever   . Terminal ileitis of small intestine ([place])    referred to [facility], Crohn's ruled out  . Thyroid disease     Patient Active Problem List   Diagnosis Date Noted  . Lower abdominal pain 04/12/2018  . Constipation 04/12/2018  . Ileus ([place]) 04/12/2018  . SBO (small bowel obstruction) ([place]) 04/10/2018  . Rectal polyp   . Hiatal hernia   . Nausea without vomiting 01/15/2016  .  Rectal bleeding 01/15/2016  . Suprapubic pain 04/09/2011  . Abnormal CT of the abdomen 04/09/2011  . Weight loss, abnormal 04/09/2011  . DYSPHAGIA UNSPECIFIED 07/18/2010  . NAUSEA AND VOMITING 07/04/2010  . HYPERLIPIDEMIA 03/02/2010  . DYSPNEA 03/02/2010  . GI BLEEDING 11/23/2009  . [place] DISEASE, LUMBOSACRAL SPINE 08/03/2009  . SPINAL STENOSIS 07/24/2009  . SCIATICA 07/03/2009  . ASEPTIC NECROSIS 07/03/2009  . ILEITIS 06/13/2009  . ANEMIA, NORMOCYTIC 03/27/2009  . IBS 03/27/2009  . FATTY LIVER DISEASE 03/27/2009  . NECK PAIN, CHRONIC 03/27/2009  . DIARRHEA 03/27/2009  . ABDOMINAL PAIN, CHRONIC 03/27/2009  . ANXIETY 03/24/2009  . ALCOHOL USE 03/24/2009  . SMOKER 03/24/2009  . Essential hypertension 03/24/2009  . Internal hemorrhoids 03/24/2009  . SCHATZKI'S RING 03/24/2009  . GERD 03/24/2009  . DIVERTICULITIS, HX OF 03/24/2009    Past Surgical History:  Procedure Laterality Date  . APPENDECTOMY    . CHOLECYSTECTOMY  2004   at time of colectomy  . COLONOSCOPY  03/2009   anastomotic ulcers and neoterminal ileum ulcers, anastomosis with SB at 30cm. bx s/o Crohn's  . COLONOSCOPY  2005   Dr. Irving Shows: rectal ulcers  . COLONOSCOPY  2011   [facility]  . COLONOSCOPY N/A 01/30/2016   Dr.Rourk- internal hemorrhoids, likely source of hematochezia,  rectal polyps removed s/p subtotal colectomy with normal appearing residual colonic and small bowel mucosa bx= hyperplasticpolyp  . ESOPHAGOGASTRODUODENOSCOPY  02/2009   mild erosive reflux esophagitis  . ESOPHAGOGASTRODUODENOSCOPY  07/2010   probable cervical esophageal web s/p dilation  . ESOPHAGOGASTRODUODENOSCOPY N/A 01/30/2016   Dr.Rourk- normal apearing patent tubular esophagus, hiatal hernia  . ethmoid mucocele, turbinate surgery  2002   right  . GIVENS CAPSULE STUDY  11/2009   Dr. Gala Romney: subtle erytherma and edema of TI  . HEMORRHOID BANDING  2017   Dr.Rourk  . hysterectomy  2009   partial  . LIVER BIOPSY  2004   steatosis,  at time of colectomy  . SUBTOTAL COLECTOMY  2004   diverticulitis with abscess  . UMBILICAL HERNIA REPAIR       OB History   No obstetric history on file.      Home Medications    Prior to Admission medications   Medication Sig Start Date End Date Taking? Authorizing Provider  dicyclomine (BENTYL) 10 MG capsule Take 10 mg by mouth 4 (four) times daily -  before meals and at bedtime.      [provider]  esomeprazole (NEXIUM) 20 MG capsule Take 1 capsule (20 mg total) by mouth 2 (two) times daily before a meal. 04/26/16   Rourk, Cristopher Estimable, MD  gabapentin (NEURONTIN) 300 MG capsule Take 300 mg by mouth 2 (two) times daily.      [provider]  HYDROcodone-acetaminophen (NORCO) 5-325 MG tablet Take 2 tablets by mouth every 4 (four) hours as needed. 09/06/18   Veryl Speak, MD  imipramine (TOFRANIL) 10 MG tablet Take 40 mg by mouth at bedtime.      [provider]  indomethacin (INDOCIN) 25 MG capsule Take 1 capsule (25 mg total) by mouth 3 (three) times daily as needed. 09/06/18   Veryl Speak, MD  levothyroxine (SYNTHROID, LEVOTHROID) 25 MCG tablet Take 25 mcg by mouth daily. 07/05/16   [provider]  metFORMIN (GLUCOPHAGE) 500 MG tablet Take 500 mg by mouth daily.    [provider]  Multiple Vitamins-Calcium (ONE-A-DAY WOMENS FORMULA PO) Take 1 tablet by mouth daily. Alternating each week with Caltrate w/D     [provider]  NAPROXEN PO Take 50 mg by mouth as needed.    [provider]  Omega-3 Fatty Acids (FISH OIL) 1200 MG CAPS Take 1 capsule by mouth 2 (two) times daily.    [provider]  omeprazole (PRILOSEC) 40 MG capsule Take 1 capsule by mouth daily. 04/06/19   [provider]  polyethylene glycol (MIRALAX) packet Take 17 g by mouth 2 (two) times daily. 04/12/18   Dhungel, Nishant, MD  QUEtiapine (SEROQUEL) 50 MG tablet Take 50 mg by mouth at bedtime.      [provider]  ranitidine  (ZANTAC) 300 MG tablet Take 300 mg by mouth at bedtime.    [provider]  sertraline (ZOLOFT) 50 MG tablet Take 1 tablet by mouth daily. 03/01/19   [provider]  simvastatin (ZOCOR) 10 MG tablet Take 10 mg by mouth daily. 07/05/16   [provider]  spironolactone (ALDACTONE) 50 MG tablet Take 50 mg by mouth 2 (two) times daily.     [provider]    Family History Family History  Problem Relation Age of Onset  . ALS Father   . Crohn's disease Daughter   . Diabetes Mother   . Rheum arthritis Mother  Social History Social History   Tobacco Use  . Smoking status: Former Smoker    Packs/day: 0.50    Years: 10.00    Pack years: 5.00    Types: Cigarettes    Quit date: 12/15/2006    Years since quitting: 12.5  . Smokeless tobacco: Never Used  Substance Use Topics  . Alcohol use: Yes    Alcohol/week: 0.0 standard drinks  . Drug use: No     Allergies   Hydromorphone hcl   Review of Systems Review of Systems  Constitutional: Negative for fever.  Respiratory: Negative for shortness of breath.   Gastrointestinal: Negative for abdominal pain, constipation and vomiting.  Genitourinary: Positive for flank pain. Negative for decreased urine volume, difficulty urinating, dysuria, hematuria, pelvic pain and vaginal bleeding.  Musculoskeletal: Positive for back pain. Negative for joint swelling.  Skin: Negative for rash.  Neurological: Negative for dizziness, weakness and numbness.     Physical Exam Updated Vital Signs BP 110/75   Pulse 67   Temp 98.3 F (36.8 C)   Resp 18   Ht 5\' 3"  (1.6 m)   Wt 85.6 kg   SpO2 100%   BMI 33.43 kg/m   Physical Exam Vitals signs and nursing note reviewed.  Constitutional:      Appearance: Normal appearance. She is not ill-appearing.  Neck:     Musculoskeletal: Normal range of motion.  Cardiovascular:     Rate and Rhythm: Normal rate and regular rhythm.     Pulses: Normal pulses.   Pulmonary:     Effort: Pulmonary effort is normal.     Breath sounds: Normal breath sounds.  Chest:     Chest wall: No tenderness.  Abdominal:     General: There is no distension.     Palpations: Abdomen is soft.     Tenderness: There is no abdominal tenderness. There is no guarding.  Musculoskeletal: Normal range of motion.        General: Tenderness present.     Comments: Tenderness to palpation of the lower lumbar spine and right paraspinal muscles.  No bony deformity.  Positive straight leg raise on the right at 30 degrees.  Hip flexors and extensors are intact.  Skin:    General: Skin is warm.     Capillary Refill: Capillary refill takes less than 2 seconds.     Findings: No rash.  Neurological:     General: No focal deficit present.     Mental Status: She is alert.      ED Treatments / Results  Labs (all labs ordered are listed, but only abnormal results are displayed) Labs Reviewed  BASIC METABOLIC PANEL - Abnormal; Notable for the following components:      Result Value   Glucose, Bld 106 (*)    Creatinine, Ser 1.07 (*)    GFR calc non Af Amer 57 (*)    All other components within normal limits  URINALYSIS, ROUTINE W REFLEX MICROSCOPIC - Abnormal; Notable for the following components:   Hgb urine dipstick MODERATE (*)    Bacteria, UA RARE (*)    All other components within normal limits  CBC WITH DIFFERENTIAL/PLATELET    EKG None  Radiology Ct Renal Stone Study  Result Date: 06/22/2019 CLINICAL DATA:  Right flank pain for 10 days getting worse. Denies blood in urine or prior history of kidney stones. EXAM: CT ABDOMEN AND PELVIS WITHOUT CONTRAST TECHNIQUE: Multidetector CT imaging of the abdomen and pelvis was performed following the standard protocol  without IV contrast. COMPARISON:  01/04/2016 FINDINGS: Lower chest: No acute abnormality. Hepatobiliary: No focal liver abnormality is seen. Status post cholecystectomy. No biliary dilatation. Pancreas: Unremarkable.  No pancreatic ductal dilatation or surrounding inflammatory changes. Spleen: Normal in size without focal abnormality. Adrenals/Urinary Tract: Adrenal glands are unremarkable. Kidneys are normal, without renal calculi, focal lesion, or hydronephrosis. Bladder is decompressed. Stomach/Bowel: Stomach is within normal limits. Appendix appears normal. No evidence of bowel wall thickening, distention, or inflammatory changes. Prior colectomy with ileorectal anastomosis. Vascular/Lymphatic: No significant vascular findings are present. No enlarged abdominal or pelvic lymph nodes. Reproductive: Status post hysterectomy. No adnexal masses. Other: No abdominal wall hernia or abnormality. No abdominopelvic ascites. Musculoskeletal: No acute or significant osseous findings. IMPRESSION: 1. No acute abdominal or pelvic pathology. 2. No urolithiasis or obstructive uropathy. Electronically Signed   By: Kathreen Devoid   On: 06/22/2019 10:58    Procedures Procedures (including critical care time)  Medications Ordered in ED Medications  HYDROcodone-acetaminophen (NORCO/VICODIN) 5-325 MG per tablet 1 tablet (1 tablet Oral Given 06/22/19 1044)  cyclobenzaprine (FLEXERIL) tablet 10 mg (10 mg Oral Given 06/22/19 1044)     Initial Impression / Assessment and Plan / ED Course  I have reviewed the triage vital signs and the nursing notes.  Pertinent labs & imaging results that were available during my care of the patient were reviewed by me and considered in my medical decision making (see chart for details).        Patient well-appearing.  Vital signs reviewed.  Right-sided low back and flank pain that is exacerbated by movement and sitting.  No urinary symptoms.  Neurovascularly intact.  Ambulatory with steady gait.  I feel her symptoms are likely musculoskeletal.  CT renal stone study is negative and labs are reassuring.  Patient is feeling better after medication given here, she appears appropriate for discharge home.   She agrees to close outpatient follow-up, return precautions were discussed.  Final Clinical Impressions(s) / ED Diagnoses   Final diagnoses:  Sciatica of right side    ED Discharge Orders    None       Kem Parkinson, PA-C 06/24/19 1501    Nat Christen, MD 06/24/19 1511

## 2019-07-06 DIAGNOSIS — M5431 Sciatica, right side: Secondary | ICD-10-CM | POA: Diagnosis not present

## 2019-07-07 ENCOUNTER — Other Ambulatory Visit: Payer: Self-pay

## 2019-07-07 NOTE — Patient Outreach (Signed)
[facility]) Care Management  07/07/2019  KAJSA BUTRUM 04/16/1961 [id number]   Medication Adherence call to Mrs. [place] Compliant Voice message left with a call back number. Mrs. Mast is showing past due on Simvastatin 10 mg and Metformin 500 mg under [place].   [facility] Management Direct Dial [phone]  Fax [phone] [email or web address]

## 2019-08-06 ENCOUNTER — Other Ambulatory Visit: Payer: Self-pay

## 2019-08-06 NOTE — Patient Outreach (Signed)
[facility]) Care Management  08/06/2019  KAMEA ALLINSON 05-Apr-1961 [id number]   Medication Adherence call to Mrs. Annikah Walter [place] Identifiers Verify spoke with patient she is past due on Metformin 500 mg patientt explain she takes 1 tablet daily patient has three tablets left, patient ask if we can call [facility] to order this medication they will have it ready for patient to pick up.Mrs. Reichle is showing past due under [place].   [facility] Management Direct Dial [phone]  Fax [phone] [email or web address]

## 2019-09-07 DIAGNOSIS — I10 Essential (primary) hypertension: Secondary | ICD-10-CM | POA: Diagnosis not present

## 2019-09-07 DIAGNOSIS — E039 Hypothyroidism, unspecified: Secondary | ICD-10-CM | POA: Diagnosis not present

## 2019-09-07 DIAGNOSIS — E119 Type 2 diabetes mellitus without complications: Secondary | ICD-10-CM | POA: Diagnosis not present

## 2019-09-07 DIAGNOSIS — M5431 Sciatica, right side: Secondary | ICD-10-CM | POA: Diagnosis not present

## 2019-09-07 DIAGNOSIS — K210 Gastro-esophageal reflux disease with esophagitis: Secondary | ICD-10-CM | POA: Diagnosis not present

## 2019-11-09 ENCOUNTER — Other Ambulatory Visit: Payer: Self-pay

## 2019-11-09 NOTE — Patient Outreach (Signed)
[facility]) Care Management  11/09/2019  TABER SANKEY [birth year] [id number]   Medication Adherence call to Mrs. [place] Compliant Voice message left with a call back number. Mrs. Thayer Jew is showing past due on Simvastatin 10 mg and Metformin 500 mg under [place].   [facility] Management Direct Dial [phone]  Fax [phone] [email or web address]

## 2019-12-14 DIAGNOSIS — I10 Essential (primary) hypertension: Secondary | ICD-10-CM | POA: Diagnosis not present

## 2019-12-14 DIAGNOSIS — M5431 Sciatica, right side: Secondary | ICD-10-CM | POA: Diagnosis not present

## 2019-12-14 DIAGNOSIS — K2100 Gastro-esophageal reflux disease with esophagitis, without bleeding: Secondary | ICD-10-CM | POA: Diagnosis not present

## 2019-12-14 DIAGNOSIS — E119 Type 2 diabetes mellitus without complications: Secondary | ICD-10-CM | POA: Diagnosis not present

## 2020-01-20 ENCOUNTER — Ambulatory Visit: Payer: Medicare Other | Attending: Internal Medicine

## 2020-01-20 ENCOUNTER — Other Ambulatory Visit: Payer: Self-pay

## 2020-01-20 DIAGNOSIS — Z20822 Contact with and (suspected) exposure to covid-19: Secondary | ICD-10-CM

## 2020-01-21 LAB — NOVEL CORONAVIRUS, NAA: SARS-CoV-2, NAA: NOT DETECTED

## 2020-01-23 ENCOUNTER — Telehealth: Payer: Self-pay

## 2020-01-23 NOTE — Telephone Encounter (Signed)

## 2020-02-13 ENCOUNTER — Encounter (HOSPITAL_COMMUNITY): Payer: Self-pay | Admitting: Emergency Medicine

## 2020-02-13 ENCOUNTER — Emergency Department (HOSPITAL_COMMUNITY)
Admission: EM | Admit: 2020-02-13 | Discharge: 2020-02-13 | Disposition: A | Discharge status: Home / Self Care | Payer: Medicare Other | Attending: Emergency Medicine | Admitting: Emergency Medicine

## 2020-02-13 ENCOUNTER — Other Ambulatory Visit: Payer: Self-pay

## 2020-02-13 DIAGNOSIS — M5441 Lumbago with sciatica, right side: Secondary | ICD-10-CM | POA: Insufficient documentation

## 2020-02-13 DIAGNOSIS — R7303 Prediabetes: Secondary | ICD-10-CM | POA: Diagnosis not present

## 2020-02-13 DIAGNOSIS — E039 Hypothyroidism, unspecified: Secondary | ICD-10-CM | POA: Diagnosis not present

## 2020-02-13 DIAGNOSIS — Z87891 Personal history of nicotine dependence: Secondary | ICD-10-CM | POA: Insufficient documentation

## 2020-02-13 DIAGNOSIS — Z79899 Other long term (current) drug therapy: Secondary | ICD-10-CM | POA: Insufficient documentation

## 2020-02-13 DIAGNOSIS — M5489 Other dorsalgia: Secondary | ICD-10-CM | POA: Diagnosis present

## 2020-02-13 DIAGNOSIS — Z7984 Long term (current) use of oral hypoglycemic drugs: Secondary | ICD-10-CM | POA: Diagnosis not present

## 2020-02-13 DIAGNOSIS — I10 Essential (primary) hypertension: Secondary | ICD-10-CM | POA: Diagnosis not present

## 2020-02-13 MED ORDER — KETOROLAC TROMETHAMINE 60 MG/2ML IM SOLN
15.0000 mg | Freq: Once | INTRAMUSCULAR | Status: AC
  Administered 2020-02-13: 21:00:00 15 mg via INTRAMUSCULAR
  Filled 2020-02-13: qty 2

## 2020-02-13 MED ORDER — PREDNISONE 10 MG PO TABS: 20.0000 mg | ORAL_TABLET | Freq: Every day | ORAL | 0 refills | Status: AC

## 2020-02-13 MED ORDER — CYCLOBENZAPRINE HCL 10 MG PO TABS: 10.0000 mg | ORAL_TABLET | Freq: Two times a day (BID) | ORAL | 0 refills | Status: DC | PRN

## 2020-02-13 MED ORDER — PREDNISONE 20 MG PO TABS
40.0000 mg | ORAL_TABLET | Freq: Once | ORAL | Status: AC
  Administered 2020-02-13: 21:00:00 40 mg via ORAL
  Filled 2020-02-13: qty 2

## 2020-02-13 NOTE — ED Triage Notes (Signed)
Pt C/o right sided back pain that runs down to her knee. Pt denies N/V.

## 2020-02-13 NOTE — ED Provider Notes (Signed)
[facility] EMERGENCY DEPARTMENT Provider Note   CSN: [id number] Arrival date & time: 02/13/20  1941     History Chief Complaint  Patient presents with  . Back Pain    [name] is a 59 y.o. female with past medical history as listed below presents to emergency department today with chief complaint of acute onset of back pain. Pain started today while she was sitting in church. The pain is located in her right lower back and radiates down her right leg. She describes pain as sharp and rates it 8/10 in severity. Pain is worse with movement. She did not take any medications for pain prior to arrival. She denies any injury, fall, or trauma. Also denies fevers, weight loss, numbness/weakness of upper and lower extremities, bowel/bladder incontinence, urinary retention, history of cancer, saddle anesthesia, history of back surgery, history of IVDA, gross hematuria, urinary frequency or dysuria.  Patient reports similar pain in 06/2019.  Chart review shows she had a negative CT renal study and normal labs. Pain thought to be sciatica.    Past Medical History:  Diagnosis Date  . Anxiety   . Asthma   . Chronic back pain   . Depression   . Diverticulitis    complicated by abscess requiring total colectomy and take-down of colostomy in 2004  . GERD (gastroesophageal reflux disease)   . Hemorrhoids   . Hiatal hernia   . HTN (hypertension)   . Hypothyroidism   . IBS (irritable bowel syndrome)   . Migraine   . Neck pain, chronic   . Obesity   . Polyp of colon, hyperplastic   . [facility] spotted fever   . Terminal ileitis of small intestine ([place])    referred to [facility], Crohn's ruled out  . Thyroid disease     Patient Active Problem List   Diagnosis Date Noted  . Lower abdominal pain 04/12/2018  . Constipation 04/12/2018  . Ileus ([place]) 04/12/2018  . SBO (small bowel obstruction) ([place]) 04/10/2018  . Rectal polyp   . Hiatal hernia   . Nausea without vomiting 01/15/2016    . Rectal bleeding 01/15/2016  . Suprapubic pain 04/09/2011  . Abnormal CT of the abdomen 04/09/2011  . Weight loss, abnormal 04/09/2011  . DYSPHAGIA UNSPECIFIED 07/18/2010  . NAUSEA AND VOMITING 07/04/2010  . HYPERLIPIDEMIA 03/02/2010  . DYSPNEA 03/02/2010  . GI BLEEDING 11/23/2009  . Bee DISEASE, LUMBOSACRAL SPINE 08/03/2009  . SPINAL STENOSIS 07/24/2009  . SCIATICA 07/03/2009  . ASEPTIC NECROSIS 07/03/2009  . ILEITIS 06/13/2009  . ANEMIA, NORMOCYTIC 03/27/2009  . IBS 03/27/2009  . FATTY LIVER DISEASE 03/27/2009  . NECK PAIN, CHRONIC 03/27/2009  . DIARRHEA 03/27/2009  . ABDOMINAL PAIN, CHRONIC 03/27/2009  . ANXIETY 03/24/2009  . ALCOHOL USE 03/24/2009  . SMOKER 03/24/2009  . Essential hypertension 03/24/2009  . Internal hemorrhoids 03/24/2009  . SCHATZKI'S RING 03/24/2009  . GERD 03/24/2009  . DIVERTICULITIS, HX OF 03/24/2009    Past Surgical History:  Procedure Laterality Date  . APPENDECTOMY    . CHOLECYSTECTOMY  2004   at time of colectomy  . COLONOSCOPY  03/2009   anastomotic ulcers and neoterminal ileum ulcers, anastomosis with SB at 30cm. bx s/o Crohn's  . COLONOSCOPY  2005   Dr. Irving Shows: rectal ulcers  . COLONOSCOPY  2011   [facility]  . COLONOSCOPY N/A 01/30/2016   Dr.Rourk- internal hemorrhoids, likely source of hematochezia, rectal polyps removed s/p subtotal colectomy with normal appearing residual colonic and small bowel mucosa bx=  hyperplasticpolyp  . ESOPHAGOGASTRODUODENOSCOPY  02/2009   mild erosive reflux esophagitis  . ESOPHAGOGASTRODUODENOSCOPY  07/2010   probable cervical esophageal web s/p dilation  . ESOPHAGOGASTRODUODENOSCOPY N/A 01/30/2016   Dr.Rourk- normal apearing patent tubular esophagus, hiatal hernia  . ethmoid mucocele, turbinate surgery  2002   right  . GIVENS CAPSULE STUDY  11/2009   Dr. Gala Romney: subtle erytherma and edema of TI  . HEMORRHOID BANDING  2017   Dr.Rourk  . hysterectomy  2009   partial  . LIVER BIOPSY  2004    steatosis, at time of colectomy  . SUBTOTAL COLECTOMY  2004   diverticulitis with abscess  . UMBILICAL HERNIA REPAIR       OB History   No obstetric history on file.     Family History  Problem Relation Age of Onset  . ALS Father   . Crohn's disease Daughter   . Diabetes Mother   . Rheum arthritis Mother     Social History   Tobacco Use  . Smoking status: Former Smoker    Packs/day: 0.50    Years: 10.00    Pack years: 5.00    Types: Cigarettes    Quit date: 12/15/2006    Years since quitting: 13.1  . Smokeless tobacco: Never Used  Substance Use Topics  . Alcohol use: Yes    Alcohol/week: 0.0 standard drinks  . Drug use: No    Home Medications Prior to Admission medications   Medication Sig Start Date End Date Taking? Authorizing Provider  cyclobenzaprine (FLEXERIL) 10 MG tablet Take 1 tablet (10 mg total) by mouth 2 (two) times daily as needed for muscle spasms. 02/13/20   ,  E, PA-C  dicyclomine (BENTYL) 10 MG capsule Take 10 mg by mouth 4 (four) times daily -  before meals and at bedtime.      [provider]  esomeprazole (NEXIUM) 20 MG capsule Take 1 capsule (20 mg total) by mouth 2 (two) times daily before a meal. 04/26/16   Rourk, Cristopher Estimable, MD  gabapentin (NEURONTIN) 300 MG capsule Take 300 mg by mouth 2 (two) times daily.      [provider]  HYDROcodone-acetaminophen (NORCO/VICODIN) 5-325 MG tablet Take one tab po q 4 hrs prn pain 06/22/19   Triplett, Tammy, PA-C  imipramine (TOFRANIL) 10 MG tablet Take 40 mg by mouth at bedtime.      [provider]  indomethacin (INDOCIN) 25 MG capsule Take 1 capsule (25 mg total) by mouth 3 (three) times daily as needed. 09/06/18   Veryl Speak, MD  levothyroxine (SYNTHROID, LEVOTHROID) 25 MCG tablet Take 25 mcg by mouth daily. 07/05/16   [provider]  metFORMIN (GLUCOPHAGE) 500 MG tablet Take 500 mg by mouth daily.    [provider]  Multiple Vitamins-Calcium  (ONE-A-DAY WOMENS FORMULA PO) Take 1 tablet by mouth daily. Alternating each week with Caltrate w/D     [provider]  NAPROXEN PO Take 50 mg by mouth as needed.    [provider]  Omega-3 Fatty Acids (FISH OIL) 1200 MG CAPS Take 1 capsule by mouth 2 (two) times daily.    [provider]  omeprazole (PRILOSEC) 40 MG capsule Take 1 capsule by mouth daily. 04/06/19   [provider]  polyethylene glycol (MIRALAX) packet Take 17 g by mouth 2 (two) times daily. 04/12/18   Dhungel, Flonnie Overman, MD  predniSONE (DELTASONE) 10 MG tablet Take 2 tablets (20 mg total) by mouth daily for 5 days. 02/13/20  02/18/20  ,  E, PA-C  QUEtiapine (SEROQUEL) 50 MG tablet Take 50 mg by mouth at bedtime.      [provider]  ranitidine (ZANTAC) 300 MG tablet Take 300 mg by mouth at bedtime.    [provider]  sertraline (ZOLOFT) 50 MG tablet Take 1 tablet by mouth daily. 03/01/19   [provider]  simvastatin (ZOCOR) 10 MG tablet Take 10 mg by mouth daily. 07/05/16   [provider]  spironolactone (ALDACTONE) 50 MG tablet Take 50 mg by mouth 2 (two) times daily.     [provider]    Allergies    Hydromorphone hcl  Review of Systems   Review of Systems All other systems are reviewed and are negative for acute change except as noted in the HPI.  Physical Exam Updated Vital Signs BP 123/87 (BP Location: Right Arm)   Pulse 71   Temp 98.4 F (36.9 C) (Oral)   Resp 18   SpO2 100%   Physical Exam Vitals and nursing note reviewed.  Constitutional:      Appearance: She is well-developed. She is not ill-appearing or toxic-appearing.  HENT:     Head: Normocephalic and atraumatic.     Nose: Nose normal.  Eyes:     General: No scleral icterus.       Right eye: No discharge.        Left eye: No discharge.     Conjunctiva/sclera: Conjunctivae normal.  Neck:     Vascular: No JVD.  Cardiovascular:     Rate and Rhythm:  Normal rate and regular rhythm.     Pulses: Normal pulses.     Heart sounds: Normal heart sounds.  Pulmonary:     Effort: Pulmonary effort is normal.     Breath sounds: Normal breath sounds.  Abdominal:     General: There is no distension.     Palpations: Abdomen is soft.     Tenderness: There is no right CVA tenderness, left CVA tenderness, guarding or rebound.  Musculoskeletal:        General: Normal range of motion.     Cervical back: Normal range of motion.     Right lower leg: No edema.     Left lower leg: No edema.     Comments: Full range of motion of the T-spine and L-spine No tenderness to palpation of the spinous processes of the T-spine or L-spine No crepitus, deformity or step-offs tenderness to palpation of the right paraspinous muscles of the L-spine. No overlying skin changes.  Positive straight leg raise test on the right. Pelvis is stable, full ROM of bilateral knees and ankles  Skin:    General: Skin is warm and dry.     Findings: Rash:    Neurological:     Mental Status: She is oriented to person, place, and time.     GCS: GCS eye subscore is 4. GCS verbal subscore is 5. GCS motor subscore is 6.     Comments: Fluent speech, no facial droop  Sensation grossly intact to light touch in the lower extremities bilaterally. No saddle anesthesias. Strength 5/5 with flexion and extension at the bilateral hips, knees, and ankles. No noted gait deficit. Coordination intact with heel to shin testing.   Psychiatric:        Behavior: Behavior normal.     ED Results / Procedures / Treatments   Labs (all labs ordered are listed, but only abnormal results are displayed) Labs Reviewed -  No data to display  EKG None  Radiology No results found.  Procedures Procedures (including critical care time)  Medications Ordered in ED Medications  ketorolac (TORADOL) injection 15 mg (15 mg Intramuscular Given 02/13/20 2111)  predniSONE (DELTASONE) tablet 40 mg (40 mg Oral  Given 02/13/20 2110)    ED Course  I have reviewed the triage vital signs and the nursing notes.  Pertinent labs & imaging results that were available during my care of the patient were reviewed by me and considered in my medical decision making (see chart for details).    MDM Rules/Calculators/A&P                      Normal neurological exam, no evidence of urinary incontinence or retention, pain is consistently reproducible. There is no evidence of AAA or concern for dissection at this time. No urinary symptoms to suggest kidney stone and pain does not radiate to the groin. Doubt kidney stone as cause of her pain today.  Patient can walk but states is painful.  No loss of bowel or bladder control.  No concern for cauda equina.  No fever, night sweats, weight loss, h/o cancer, IVDU.  Pain treated here in the department with adequate improvement.  Chart review shows she has had very mild the elevated creatinine in the past, will give dose of IM Toradol 50 mg here for pain control.  RICE protocol discussed with patient. Patient is prediabetic on Metformin.  She checks her blood sugars at home and states they have overall been in the 80s.  Will discharge with short burst of prednisone.  Patient advised to continue to closely monitor glucose and to stop taking the prednisone if it becomes elevated.  We will also discharged with prescription for Flexeril.  Patient aware she cannot drive or work while taking this medication as it can make her drowsy.   I have also discussed reasons to return immediately to the ER.  Patient expresses understanding and agrees with plan.    Final Clinical Impression(s) / ED Diagnoses Final diagnoses:  Acute right-sided low back pain with right-sided sciatica    Rx / DC Orders ED Discharge Orders         Ordered    predniSONE (DELTASONE) 10 MG tablet  Daily     02/13/20 2153    cyclobenzaprine (FLEXERIL) 10 MG tablet  2 times daily PRN     02/13/20 2153             Flint Melter 02/13/20 2157    Noemi Chapel, MD 02/14/20 1455

## 2020-02-13 NOTE — Discharge Instructions (Addendum)
You have been seen today for back pain. Please read and follow all provided instructions. Return to the emergency room for worsening condition or new concerning symptoms.    1. Medications:  -Prescription sent to your pharmacy for prednisone.  This is a steroid.  It is used to help treat inflammation and pain.  Please take as prescribed.  Please check your blood sugar daily.  If it becomes elevated you need to stop taking the prednisone.  Prescription sent to your pharmacy for flexeril. This is a muscle relaxer, please take as prescribed and only if needed.  This medication can make you drowsy.  Please do not drive or work while taking.  Most people take this medicine before bed to help them sleep if they are having severe pain.  Continue usual home medications Take medications as prescribed. Please review all of the medicines and only take them if you do not have an allergy to them.   2. Treatment: rest, drink plenty of fluids  3. Follow Up:  Please follow up with primary care provider by scheduling an appointment as soon as possible for a visit  If you do not have a primary care physician, contact [facility] at [phone] for referral   It is also a possibility that you have an allergic reaction to any of the medicines that you have been prescribed - Everybody reacts differently to medications and while MOST people have no trouble with most medicines, you may have a reaction such as nausea, vomiting, rash, swelling, shortness of breath. If this is the case, please stop taking the medicine immediately and contact your physician.  ?

## 2020-03-21 DIAGNOSIS — K2100 Gastro-esophageal reflux disease with esophagitis, without bleeding: Secondary | ICD-10-CM | POA: Diagnosis not present

## 2020-03-21 DIAGNOSIS — M5431 Sciatica, right side: Secondary | ICD-10-CM | POA: Diagnosis not present

## 2020-03-21 DIAGNOSIS — I10 Essential (primary) hypertension: Secondary | ICD-10-CM | POA: Diagnosis not present

## 2020-03-21 DIAGNOSIS — Z0000 Encounter for general adult medical examination without abnormal findings: Secondary | ICD-10-CM | POA: Diagnosis not present

## 2020-03-21 DIAGNOSIS — E119 Type 2 diabetes mellitus without complications: Secondary | ICD-10-CM | POA: Diagnosis not present

## 2020-04-05 DIAGNOSIS — R928 Other abnormal and inconclusive findings on diagnostic imaging of breast: Secondary | ICD-10-CM | POA: Diagnosis not present

## 2020-04-05 DIAGNOSIS — N6489 Other specified disorders of breast: Secondary | ICD-10-CM | POA: Diagnosis not present

## 2020-05-11 ENCOUNTER — Telehealth: Payer: Self-pay

## 2020-05-19 ENCOUNTER — Other Ambulatory Visit: Payer: Self-pay | Admitting: *Deleted

## 2020-05-19 NOTE — Patient Outreach (Signed)
[place] [facility]) Care Management  05/19/2020  [name] 04-29-1961 [id number]  RN Health Coach attempted follow up outreach call to patient.  Patient was going to beauty shop and requested a call back on monday. HIPPA compliance voicemail message left with return callback number.  Plan: RN will call patient again within 10 days.  [place] Care Management [phone]

## 2020-05-22 ENCOUNTER — Other Ambulatory Visit: Payer: Self-pay | Admitting: *Deleted

## 2020-05-22 NOTE — Patient Outreach (Signed)
[place] [facility]) Care Management  05/22/2020  JASNOOR TRUSSELL [birth year] [id number]  RN Health Coach attempted follow up outreach call to patient.  Patient was unavailable. HIPPA compliance voicemail message left with return callback number.  Plan: RN will call patient again within10 days.  [place] Care Management [phone]

## 2020-05-29 ENCOUNTER — Other Ambulatory Visit: Payer: Self-pay | Admitting: *Deleted

## 2020-05-29 NOTE — Patient Outreach (Signed)
[place] [facility]) Care Management  05/29/2020  ALVETA QUINTELA 09/07/1961 [id number] RN Health Coach attempted follow up outreach call to patient.  Patient was unavailable. HIPPA compliance voicemail message left with return callback number.  Plan: RN will call patient again within 30 days.  [place] Care Management [phone]

## 2020-06-05 ENCOUNTER — Other Ambulatory Visit: Payer: Self-pay | Admitting: *Deleted

## 2020-06-05 NOTE — Patient Outreach (Signed)
[place] [facility]) Care Management  06/05/2020  KIYANNA BIEGLER 1961/05/30 [id number]   RN Health Coach attempted follow up outreach call to patient.  Patient was unavailable. HIPPA compliance voicemail message left with return callback number.  Plan: RN will call patient again within 30 days.  [place] Care Management [phone]

## 2020-06-22 ENCOUNTER — Other Ambulatory Visit: Payer: Self-pay | Admitting: *Deleted

## 2020-06-22 NOTE — Patient Outreach (Signed)
[place] [facility]) Care Management  [name]  06/22/2020   [name] Jun 04, 1961 [id number]  [name] received return  telephone call from patient.  Hipaa compliance verified. Per patient she is interested in the program. She said she is sorry she hadn't got to call me back. A1C is 6.4. Patient stated she had not been checking her blood sugars as she should. Patient stated that she had been trying to eat right and go exercise at the [facility]. RN noted that patient was taking her synthroid with all medications at the same time. RN noted that patient took medications and went to exercise without eating breakfast.  Patient has hypertension but does not have a blood pressure cuff. 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   Encounter Medications:  Outpatient Encounter Medications as of 06/22/2020  Medication Sig Note   cyclobenzaprine (FLEXERIL) 10 MG tablet Take 1 tablet (10 mg total) by mouth 2 (two) times daily as needed for muscle spasms.    dicyclomine (BENTYL) 10 MG capsule Take 10 mg by mouth 4 (four) times daily -  before meals and at bedtime.      esomeprazole (NEXIUM) 20 MG capsule Take 1 capsule (20 mg total) by mouth 2 (two) times daily before a meal.    gabapentin (NEURONTIN) 300 MG capsule Take 300 mg by mouth 2 (two) times daily.      HYDROcodone-acetaminophen (NORCO/VICODIN) 5-325 MG tablet Take one tab po q 4 hrs prn pain    imipramine (TOFRANIL) 10 MG tablet Take 40 mg by mouth at bedtime.      indomethacin (INDOCIN) 25 MG capsule Take 1 capsule (25 mg total) by mouth 3 (three) times daily as needed.    levothyroxine (SYNTHROID, LEVOTHROID) 25 MCG tablet Take 25 mcg by mouth daily. 07/30/2016: Received from: External Pharmacy   metFORMIN (GLUCOPHAGE) 500 MG tablet Take 500 mg by mouth daily.    Multiple Vitamins-Calcium (ONE-A-DAY WOMENS FORMULA PO) Take 1 tablet by mouth  daily. Alternating each week with Caltrate w/D     NAPROXEN PO Take 50 mg by mouth as needed.    Omega-3 Fatty Acids (FISH OIL) 1200 MG CAPS Take 1 capsule by mouth 2 (two) times daily.    omeprazole (PRILOSEC) 40 MG capsule Take 1 capsule by mouth daily.    polyethylene glycol (MIRALAX) packet Take 17 g by mouth 2 (two) times daily.    QUEtiapine (SEROQUEL) 50 MG tablet Take 50 mg by mouth at bedtime.      ranitidine (ZANTAC) 300 MG tablet Take 300 mg by mouth at bedtime.    sertraline (ZOLOFT) 50 MG tablet Take 1 tablet by mouth daily.    simvastatin (ZOCOR) 10 MG tablet Take 10 mg by mouth daily. 07/30/2016: Received from: External Pharmacy   spironolactone (ALDACTONE) 50 MG tablet Take 50 mg by mouth 2 (two) times daily.     No facility-administered encounter medications on file as of 06/22/2020.    Functional Status:  No flowsheet data found.  Fall/Depression Screening: No flowsheet data found. 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

## 2020-06-22 NOTE — Patient Outreach (Signed)
[place] [facility]) Care Management  06/22/2020  [name] April 15, 1961 [id number]   [place] attempted 4th follow up outreach screening call to patient.  Patient was unavailable. HIPPA compliance voicemail message left with return callback number.  Plan: RN will close close if no return call or response from letter within 10 days [place] Management [phone]

## 2020-06-22 NOTE — Addendum Note (Signed)
Addended by: Verlin Grills on: 06/22/2020 01:45 PM   Modules accepted: Orders

## 2020-06-27 DIAGNOSIS — Z0000 Encounter for general adult medical examination without abnormal findings: Secondary | ICD-10-CM | POA: Diagnosis not present

## 2020-06-27 DIAGNOSIS — I10 Essential (primary) hypertension: Secondary | ICD-10-CM | POA: Diagnosis not present

## 2020-06-27 DIAGNOSIS — E119 Type 2 diabetes mellitus without complications: Secondary | ICD-10-CM | POA: Diagnosis not present

## 2020-06-27 DIAGNOSIS — M5431 Sciatica, right side: Secondary | ICD-10-CM | POA: Diagnosis not present

## 2020-06-27 DIAGNOSIS — K219 Gastro-esophageal reflux disease without esophagitis: Secondary | ICD-10-CM | POA: Diagnosis not present

## 2020-06-28 ENCOUNTER — Other Ambulatory Visit: Payer: Self-pay | Admitting: *Deleted

## 2020-06-29 NOTE — Patient Outreach (Signed)
[place] [facility]) Care Management  06/29/2020  [name] [birth year] [id number]   CSW made initial contact with pt and confirmed pt identity, CSW introduced self, role and reason for call; stress due to husband's health.  Pt reports feeling agitated, irritable and needing help with coping" since her husbands diagnosis of endstage COPD.  She was started on medication "for my nerves" 3 weeks ago and is giving it time to work.  She is also interested in counseling services/support and agrees to [place] investigating providers near her home.   Pt reports she has been married for 9 years and they have a blended family of 2 adult kids each. Pt has some family assistance and support but would benefit from additional outlets.  Pt states she wokrd part time about 3 hours, 3 times weekly.  "I don't wanna come home some days".  CSW talked with her about different emotions she and her husband are likely both going through individually which may cause feelings of anger, grief/sadness and madness, etc.  She is also interested in resources for possible caregiver support/assistance.  CSW will mail pt info to review and follow up with pt regarding in network providers for mental health counseling and support and follow up with pt in 2-3 weeks.  Eduard Clos, MSW, [place] Worker  [place] [phone]

## 2020-07-01 ENCOUNTER — Other Ambulatory Visit: Payer: Self-pay

## 2020-07-01 ENCOUNTER — Emergency Department (HOSPITAL_COMMUNITY)
Admission: EM | Admit: 2020-07-01 | Discharge: 2020-07-01 | Disposition: A | Discharge status: Home / Self Care | Payer: Medicare Other | Attending: Emergency Medicine | Admitting: Emergency Medicine

## 2020-07-01 ENCOUNTER — Encounter (HOSPITAL_COMMUNITY): Payer: Self-pay | Admitting: Emergency Medicine

## 2020-07-01 DIAGNOSIS — Y999 Unspecified external cause status: Secondary | ICD-10-CM | POA: Diagnosis not present

## 2020-07-01 DIAGNOSIS — Z79890 Hormone replacement therapy (postmenopausal): Secondary | ICD-10-CM | POA: Diagnosis not present

## 2020-07-01 DIAGNOSIS — T310 Burns involving less than 10% of body surface: Secondary | ICD-10-CM | POA: Diagnosis not present

## 2020-07-01 DIAGNOSIS — Y93G3 Activity, cooking and baking: Secondary | ICD-10-CM | POA: Diagnosis not present

## 2020-07-01 DIAGNOSIS — Z87891 Personal history of nicotine dependence: Secondary | ICD-10-CM | POA: Insufficient documentation

## 2020-07-01 DIAGNOSIS — I10 Essential (primary) hypertension: Secondary | ICD-10-CM | POA: Diagnosis not present

## 2020-07-01 DIAGNOSIS — X102XXA Contact with fats and cooking oils, initial encounter: Secondary | ICD-10-CM | POA: Diagnosis not present

## 2020-07-01 DIAGNOSIS — Y929 Unspecified place or not applicable: Secondary | ICD-10-CM | POA: Insufficient documentation

## 2020-07-01 DIAGNOSIS — E039 Hypothyroidism, unspecified: Secondary | ICD-10-CM | POA: Diagnosis not present

## 2020-07-01 DIAGNOSIS — J45909 Unspecified asthma, uncomplicated: Secondary | ICD-10-CM | POA: Insufficient documentation

## 2020-07-01 DIAGNOSIS — T300 Burn of unspecified body region, unspecified degree: Secondary | ICD-10-CM

## 2020-07-01 DIAGNOSIS — T24132A Burn of first degree of left lower leg, initial encounter: Secondary | ICD-10-CM | POA: Diagnosis not present

## 2020-07-01 DIAGNOSIS — T24131A Burn of first degree of right lower leg, initial encounter: Secondary | ICD-10-CM | POA: Diagnosis not present

## 2020-07-01 DIAGNOSIS — Z79899 Other long term (current) drug therapy: Secondary | ICD-10-CM | POA: Diagnosis not present

## 2020-07-01 MED ORDER — HYDROCODONE-ACETAMINOPHEN 5-325 MG PO TABS: 1.0000 | ORAL_TABLET | Freq: Four times a day (QID) | ORAL | 0 refills | Status: DC | PRN

## 2020-07-01 MED ORDER — SILVER SULFADIAZINE 1 % EX CREA
TOPICAL_CREAM | Freq: Once | CUTANEOUS | Status: AC
  Filled 2020-07-01: qty 50

## 2020-07-01 MED ORDER — HYDROCODONE-ACETAMINOPHEN 5-325 MG PO TABS
1.0000 | ORAL_TABLET | Freq: Once | ORAL | Status: AC
Start: 2020-07-01 — End: 2020-07-01
  Administered 2020-07-01: 1 via ORAL
  Filled 2020-07-01: qty 1

## 2020-07-01 MED ORDER — SILVER SULFADIAZINE 1 % EX CREA: 1.0000 "application " | TOPICAL_CREAM | Freq: Two times a day (BID) | CUTANEOUS | 0 refills | Status: DC
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
## 2020-07-12 DIAGNOSIS — T24131A Burn of first degree of right lower leg, initial encounter: Secondary | ICD-10-CM | POA: Diagnosis not present

## 2020-07-18 ENCOUNTER — Other Ambulatory Visit: Payer: Self-pay | Admitting: *Deleted

## 2020-07-18 DIAGNOSIS — M810 Age-related osteoporosis without current pathological fracture: Secondary | ICD-10-CM | POA: Diagnosis not present

## 2020-07-18 DIAGNOSIS — Z13820 Encounter for screening for osteoporosis: Secondary | ICD-10-CM | POA: Diagnosis not present

## 2020-07-18 NOTE — Patient Outreach (Signed)
[facility]) Care Management  07/18/2020  [name] 1961/04/08 [id number]   CSW spoke with pt who has not received the mailed materials yet.  CSW provided pt with contact name/# for outpatient counseling in [place].  Pt appreciative and eager to call and make an appointment.  CSW commended her again for her awareness and  willingness/desire to seek support.  "I don't like the way I react to my husband".  CSW validated her feelings and reassured her it is common for loved ones to go through their own grief stages (to include anger) when someone has a terminal illness.   CSW reminded pt to call CSW if needs arise or if she is unable to get appointment for counseling.   Pt also shared with CSW she has been taking the medication prescribed by her PCP for "my nerves".  Per pt, she has been on the RX for about 4 weeks and she has not seen "absolutely no changes".  CSW encouraged pt to continue to monitor and to speak with her PCP about this for possible changes to dose, meds, etc.    CSW will plan to follow up with pt in 2-3 weeks for updates.   Eduard Clos, MSW, [place] Worker  [place] [phone]

## 2020-08-07 ENCOUNTER — Ambulatory Visit: Payer: Self-pay | Admitting: *Deleted

## 2020-08-08 ENCOUNTER — Other Ambulatory Visit: Payer: Self-pay | Admitting: *Deleted

## 2020-08-08 NOTE — Patient Outreach (Signed)
[facility] [facility]) Care Management  08/08/2020  [name] 1961/11/24 [id number]  CSW attempted to reach pt on 08/07/2020 and was unable. CSW was able to leave a HIPPA compliant voice message and will await call back or try again in 3-4 business days of no return call is received.   Eduard Clos, MSW, [place] Worker  [place] [phone]

## 2020-08-10 ENCOUNTER — Other Ambulatory Visit: Payer: Self-pay | Admitting: *Deleted

## 2020-08-11 NOTE — Patient Outreach (Signed)
[place] [facility]) Care Management  08/11/2020  [name] 1961-02-01 [id number]   [place] spoke with pt on 08/10/2020 who reports she has a scheduled initial counseling appointment set up for November with [facility].  Pt would like a sooner appointment and CSW will continue to seek other Provider options and update.   Eduard Clos, MSW, [place] Worker  [place] [phone]

## 2020-08-17 ENCOUNTER — Ambulatory Visit: Payer: Self-pay | Admitting: *Deleted

## 2020-08-23 ENCOUNTER — Other Ambulatory Visit: Payer: Self-pay | Admitting: *Deleted

## 2020-08-23 NOTE — Patient Outreach (Signed)
[facility] [facility]) Care Management  08/23/2020  [name] 09/26/1961 [id number]   RN Health Coach attempted follow up outreach call to patient.  Patient was unavailable. HIPPA compliance voicemail message left with return callback number.  Plan: RN will call patient again within 30 days.  [place] Care Management [phone]

## 2020-08-25 ENCOUNTER — Other Ambulatory Visit: Payer: Self-pay | Admitting: *Deleted

## 2020-08-28 NOTE — Patient Outreach (Signed)
[facility] [facility]) Care Management  08/28/2020  [name] 23-Mar-1961 [id number]  LATE ENTRY  CSW attempted to reach pt on 08/25/2020 and was unsuccessful. CSW left a HIPPA compliant voice message and will try again in 3-4 business days per policy.   Eduard Clos, MSW, [place] Worker  [place] [phone]

## 2020-08-30 ENCOUNTER — Ambulatory Visit: Payer: Self-pay | Admitting: *Deleted

## 2020-09-04 ENCOUNTER — Ambulatory Visit: Payer: Self-pay | Admitting: *Deleted

## 2020-09-06 ENCOUNTER — Other Ambulatory Visit: Payer: Self-pay | Admitting: *Deleted

## 2020-09-06 NOTE — Patient Outreach (Signed)
[place] [facility]) Care Management  09/06/2020  FREDRICA CAPANO 08/27/1961 [id number]   [place] spoke with someone at [facility] where pt has appointment scheduled for outpatient care in November.  Their waitlist is long and this is the only local provider found that is in-network with [employer].    CSW updated pt and will plan to follow up again in the next 2 weeks.   Eduard Clos, MSW, [place] Worker  [place] [phone]

## 2020-09-15 ENCOUNTER — Other Ambulatory Visit: Payer: Self-pay | Admitting: *Deleted

## 2020-09-15 ENCOUNTER — Ambulatory Visit: Payer: Self-pay | Admitting: *Deleted

## 2020-09-15 NOTE — Patient Outreach (Signed)
[facility]) Care Management  09/15/2020  [name] [birth year] [id number]   CSW attempted to reach pt and was unsuccessful.  CSW left a HIPPA compliant voice message and will await callback or try again in 3-4 business days.   Eduard Clos, MSW, [place] Worker  [place] [phone]

## 2020-09-20 ENCOUNTER — Ambulatory Visit: Payer: Self-pay | Admitting: *Deleted

## 2020-09-22 ENCOUNTER — Other Ambulatory Visit: Payer: Self-pay | Admitting: *Deleted

## 2020-09-22 NOTE — Patient Outreach (Signed)
[facility]) Care Management  09/22/2020  [name] September 13, 1961 [id number]  RN Health Coach attempted follow up outreach call to patient.  Patient was unavailable. HIPPA compliance voicemail message left with return callback number.  Plan: RN will call patient again within 30 days. Unsuccessful outreach letter sent  [place] Management [phone]

## 2020-09-22 NOTE — Patient Outreach (Signed)
[place] [facility]) Care Management  09/22/2020  [name] 06/22/1961 [id number]   CSW attempted to reach pt and was unsuccessful. CSW left a HIPPA compliant voice message and will await callback or try again per policy; 3 attempts in 10 business days.   Eduard Clos, MSW, [place] Worker  [place] [phone]

## 2020-09-23 ENCOUNTER — Other Ambulatory Visit: Payer: Self-pay | Admitting: *Deleted

## 2020-09-23 NOTE — Patient Outreach (Deleted)
[place] [facility]) Care Management  09/23/2020  SHERRICA NIEHAUS 02-14-1961 [id number]

## 2020-09-25 ENCOUNTER — Other Ambulatory Visit: Payer: Self-pay | Admitting: *Deleted

## 2020-09-25 NOTE — Patient Outreach (Signed)
[place] [facility]) Care Management  09/25/2020  [name] 1960/12/18 [id number]   CSW received a return call from pt today.  Pt reports she is anxiously awaiting her (virtual) counseling appointment scheduled or November.  "The countdown is on and I am ready".  CSW suggested between now and her visit, she might write down things that she wants to discuss with the counselor.  "I am already doing that". Pt shared she is "jotting down things that bother me and questions to ask for solutions".  CSW encouraged pt to continue with this.  Pt reports things with her husband are about the same. CSW offered support and encouragement and will touch base again prior to her [facility] visit.  CSW offered early birthday wishes as well to pt.   Eduard Clos, MSW, [place] Worker  [place] [phone]

## 2020-09-28 ENCOUNTER — Ambulatory Visit: Payer: Self-pay | Admitting: *Deleted

## 2020-09-28 DIAGNOSIS — M5431 Sciatica, right side: Secondary | ICD-10-CM | POA: Diagnosis not present

## 2020-09-28 DIAGNOSIS — K219 Gastro-esophageal reflux disease without esophagitis: Secondary | ICD-10-CM | POA: Diagnosis not present

## 2020-09-28 DIAGNOSIS — E0843 Diabetes mellitus due to underlying condition with diabetic autonomic (poly)neuropathy: Secondary | ICD-10-CM | POA: Diagnosis not present

## 2020-09-28 DIAGNOSIS — I10 Essential (primary) hypertension: Secondary | ICD-10-CM | POA: Diagnosis not present

## 2020-09-28 DIAGNOSIS — E782 Mixed hyperlipidemia: Secondary | ICD-10-CM | POA: Diagnosis not present

## 2020-09-28 DIAGNOSIS — T24131A Burn of first degree of right lower leg, initial encounter: Secondary | ICD-10-CM | POA: Diagnosis not present

## 2020-09-28 DIAGNOSIS — E1143 Type 2 diabetes mellitus with diabetic autonomic (poly)neuropathy: Secondary | ICD-10-CM | POA: Diagnosis not present

## 2020-10-16 NOTE — Progress Notes (Addendum)
Virtual Visit via Video Note  I connected with [name] on 10/23/20 at 10:00 AM EST by a video enabled telemedicine application and verified that I am speaking with the correct person using two identifiers.  Location: Patient: home Provider: office   I discussed the limitations of evaluation and management by telemedicine and the availability of in person appointments. The patient expressed understanding and agreed to proceed.   I discussed the assessment and treatment plan with the patient. The patient was provided an opportunity to ask questions and all were answered. The patient agreed with the plan and demonstrated an understanding of the instructions.   The patient was advised to call back or seek an in-person evaluation if the symptoms worsen or if the condition fails to improve as anticipated.  I provided 47 minutes of non-face-to-face time during this encounter.   Norman Clay, MD     Psychiatric Initial Adult Assessment   Patient Identification: [name] MRN:  [id number] Date of Evaluation:  10/23/2020 Referral Source: Neale Burly, MD Chief Complaint:  "I need a help to adapt to take care of my husband" Chief Complaint    Depression     Visit Diagnosis:    ICD-10-CM   1. MDD (major depressive disorder), recurrent episode, mild (HCC)  F33.0     History of Present Illness:   [name] is a 59 y.o. year old female with a history of depression, anxiety, diabetes, GERD, hypothyroidism, migraine, who is referred for depression.   She states that her husband has stage IV COPD.  She needs some help to adapt to take care of him.  She states that although she is trying her best, nothing is right, and not enough.  She talks about stress that he will be going to deal with his condition for the rest of his wife.  She also talks about the frustration that he seems to be accepting her to be her caregiver, and that his wife.  He is constantly depending on  her, although she tries to motivate him.  He does not communicate with her, and complains all the time.  He tends to sit all day long.  She tends to feel depressed, although she tries not to dwell on it.  She notices that she gets overwhelmed very easily, and has occasional panic attacks with palpitation.  She states that she has been a caregiver, referring to her taking care of her parents, who passed away.  She is willing to try medication, which could be helpful for her.  She is also willing to see a therapist. She reports good support from church, and feels peach there.   Mood symptoms- she has symptoms as below at least for several months. She denies SI since she attempted several years ago (she regret this attempt)   Substance- she denies alcohol use or drug use. Used marijuana in the distant past.   Medication- Buspar 10 mg twice a day    Associated Signs/Symptoms: Depression Symptoms:  depressed mood, insomnia, fatigue, anxiety, panic attacks, (Hypo) Manic Symptoms:  denies decreased need for sleep, euphoria Anxiety Symptoms:  Panic Symptoms, Psychotic Symptoms:  denies AH, VH, paranoia PTSD Symptoms: Negative  Past Psychiatric History:  Outpatient: denies  Psychiatry admission:  Once after overdosed medication as below  Previous suicide attempt: overdosed medication at church in 2016, feeling "nobody care, useless" ; she denies any specific triggers Past trials of medication: does not recall. Did not stay long on medication History  of violence: denies   Previous Psychotropic Medications: Yes   Substance Abuse History in the last 12 months:  No.  Consequences of Substance Abuse: Negative  Past Medical History:  Past Medical History:  Diagnosis Date  . Anxiety   . Asthma   . Chronic back pain   . Depression   . Diverticulitis    complicated by abscess requiring total colectomy and take-down of colostomy in 2004  . GERD (gastroesophageal reflux disease)   .  Hemorrhoids   . Hiatal hernia   . HTN (hypertension)   . Hypothyroidism   . IBS (irritable bowel syndrome)   . Migraine   . Neck pain, chronic   . Obesity   . Polyp of colon, hyperplastic   . [facility] spotted fever   . Terminal ileitis of small intestine ([place])    referred to [facility], Crohn's ruled out  . Thyroid disease     Past Surgical History:  Procedure Laterality Date  . APPENDECTOMY    . CHOLECYSTECTOMY  2004   at time of colectomy  . COLONOSCOPY  03/2009   anastomotic ulcers and neoterminal ileum ulcers, anastomosis with SB at 30cm. bx s/o Crohn's  . COLONOSCOPY  2005   Dr. Irving Shows: rectal ulcers  . COLONOSCOPY  2011   [facility]  . COLONOSCOPY N/A 01/30/2016   Dr.Rourk- internal hemorrhoids, likely source of hematochezia, rectal polyps removed s/p subtotal colectomy with normal appearing residual colonic and small bowel mucosa bx= hyperplasticpolyp  . ESOPHAGOGASTRODUODENOSCOPY  02/2009   mild erosive reflux esophagitis  . ESOPHAGOGASTRODUODENOSCOPY  07/2010   probable cervical esophageal web s/p dilation  . ESOPHAGOGASTRODUODENOSCOPY N/A 01/30/2016   Dr.Rourk- normal apearing patent tubular esophagus, hiatal hernia  . ethmoid mucocele, turbinate surgery  2002   right  . GIVENS CAPSULE STUDY  11/2009   Dr. Gala Romney: subtle erytherma and edema of TI  . HEMORRHOID BANDING  2017   Dr.Rourk  . hysterectomy  2009   partial  . LIVER BIOPSY  2004   steatosis, at time of colectomy  . SUBTOTAL COLECTOMY  2004   diverticulitis with abscess  . UMBILICAL HERNIA REPAIR      Family Psychiatric History:  As below  Family History:  Family History  Problem Relation Age of Onset  . ALS Father   . Crohn's disease Daughter   . Diabetes Mother   . Rheum arthritis Mother     Social History:   Social History   Socioeconomic History  . Marital status: Married    Spouse name: Not on file  . Number of children: 2  . Years of education: Not on file  . Highest education  level: Not on file  Occupational History    Employer: UNEMPLOYED  Tobacco Use  . Smoking status: Former Smoker    Packs/day: 0.50    Years: 10.00    Pack years: 5.00    Types: Cigarettes    Quit date: 12/15/2006    Years since quitting: 13.8  . Smokeless tobacco: Never Used  Vaping Use  . Vaping Use: Never used  Substance and Sexual Activity  . Alcohol use: Not Currently    Alcohol/week: 0.0 standard drinks  . Drug use: No  . Sexual activity: Yes    Birth control/protection: Surgical  Other Topics Concern  . Not on file  Social History Narrative  . Not on file   Social Determinants of Health   Financial Resource Strain:   . Difficulty of Paying Living Expenses: Not  on file  Food Insecurity: No Food Insecurity  . Worried About Charity fundraiser in the Last Year: Never true  . Ran Out of Food in the Last Year: Never true  Transportation Needs: No Transportation Needs  . Lack of Transportation (Medical): No  . Lack of Transportation (Non-Medical): No  Physical Activity:   . Days of Exercise per Week: Not on file  . Minutes of Exercise per Session: Not on file  Stress:   . Feeling of Stress : Not on file  Social Connections:   . Frequency of Communication with Friends and Family: Not on file  . Frequency of Social Gatherings with Friends and Family: Not on file  . Attends Religious Services: Not on file  . Active Member of Clubs or Organizations: Not on file  . Attends Archivist Meetings: Not on file  . 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 Used to work at [employer] before pandemic Exercise: She stopped going to [employer] due to pandemic. She does not feel safe to go outside by herself. Support: her daughter, church family Household: husband with COPD Marital status: 48 years/12 years of relationship before marriage, divorced in 10-Mar-1978 Number of children:  2 from her previous marriage (1 son, 1 daughter), 2 from his marriage.  She had "great" relationship with her mother. She used to take care of her mother, who  deceased in 09-10-18 (two hours after her grandmother deceased). Her father deceased in 03-10-02. She has seven siblings.   Allergies:   Allergies  Allergen Reactions  . Hydromorphone Hcl Itching    REACTION: Dilaudid    Metabolic Disorder Labs: Lab Results  Component Value Date   HGBA1C 6.4 (H) 06/03/2011   MPG 137 (H) 06/03/2011   MPG 134 02/22/2009   No results found for: PROLACTIN No results found for: CHOL, TRIG, HDL, CHOLHDL, VLDL, LDLCALC Lab Results  Component Value Date   TSH 0.389 04/09/2011    Therapeutic Level Labs: No results found for: LITHIUM No results found for: CBMZ No results found for: VALPROATE  Current Medications: Current Outpatient Medications  Medication Sig Dispense Refill  . busPIRone (BUSPAR) 15 MG tablet Take 15 mg by mouth 2 (two) times daily.    . QUEtiapine (SEROQUEL) 50 MG tablet Take 50 mg by mouth at bedtime.    Marland Kitchen buPROPion (WELLBUTRIN XL) 150 MG 24 hr tablet Take 1 tablet (150 mg total) by mouth daily. 30 tablet 1  . dicyclomine (BENTYL) 10 MG capsule Take 10 mg by mouth 4 (four) times daily -  before meals and at bedtime.      . gabapentin (NEURONTIN) 300 MG capsule Take 300 mg by mouth 3 (three) times daily.     Marland Kitchen HYDROcodone-acetaminophen (NORCO/VICODIN) 5-325 MG tablet Take 1 tablet by mouth every 6 (six) hours as needed for moderate pain. 12 tablet 0  . indomethacin (INDOCIN) 25 MG capsule Take 1 capsule (25 mg total) by mouth 3 (three) times daily as needed. 21 capsule 0  . levothyroxine (SYNTHROID, LEVOTHROID) 25 MCG tablet Take 25 mcg by mouth daily.    . metFORMIN (GLUCOPHAGE) 500 MG tablet Take 500 mg by mouth daily.    . simvastatin (ZOCOR) 10 MG tablet Take 10 mg by mouth daily.    Marland Kitchen spironolactone (ALDACTONE) 50 MG tablet Take 50 mg by mouth 2 (two) times daily.  No  current facility-administered medications for this visit.    Musculoskeletal: Strength & Muscle Tone: N/A Gait & Station: N/A Patient leans: N/A  Psychiatric Specialty Exam: Review of Systems  Psychiatric/Behavioral: Positive for dysphoric mood and sleep disturbance. Negative for agitation, behavioral problems, confusion, decreased concentration, hallucinations, self-injury and suicidal ideas. The patient is nervous/anxious. The patient is not hyperactive.   All other systems reviewed and are negative.   There were no vitals taken for this visit.There is no height or weight on file to calculate BMI.  General Appearance: Fairly Groomed  Eye Contact:  Good  Speech:  Clear and Coherent  Volume:  Normal  Mood:  Depressed  Affect:  Appropriate, Congruent and down, slightly restricted  Thought Process:  Coherent  Orientation:  Full (Time, Place, and Person)  Thought Content:  Logical  Suicidal Thoughts:  No  Homicidal Thoughts:  No  Memory:  Immediate;   Good  Judgement:  Good  Insight:  Good  Psychomotor Activity:  Normal  Concentration:  Concentration: Good and Attention Span: Good  Recall:  Good  Fund of Knowledge:Good  Language: Good  Akathisia:  No  Handed:  Right  AIMS (if indicated):  not done  Assets:  Communication Skills Desire for Improvement  ADL's:  Intact  Cognition: WNL  Sleep:  Fair   Screenings: PHQ2-9     Patient Outreach Telephone from 06/22/2020 in [place] Coordination  PHQ-2 Total Score 2  PHQ-9 Total Score 4      Assessment and Plan:  [name] is a 59 y.o. year old female with a history of depression, anxiety, diabetes, who is referred for depression.   1. MDD (major depressive disorder), recurrent episode, mild ([place]) She reports depressive symptoms in the context of being a caregiver of her husband with COPD.  We will start bupropion to target depression.  Discussed potential risk of headache.  She has no  known history of seizure.  Will discontinue BuSpar at this time given she reports limited benefit.  She will greatly benefit from CBT; will make referral.   # Insomnia She reports occasional insomnia with fatigue, and has history of snoring. She did sleep study many years ago; no known abnormality according to the patient.  Will consider making a referral for sleep study at the next visit.   Plan 1. Start bupropion 150 mg daily  2. Referral to therapy  3. Discontinue Buspar   4. Next appointment: 12/13 at 10:20 for 30 mins, video - She may be on imipramine according to Epic for unknown indication. Will obtain medication list from the pharmacy.   Addendum. Based on Rx medications from the pharmacy, she is not on imipramine, but on quetiapine 50 mg qhs, gabapentin 300 mg TID, prescribed by PCP. Will continue quetiapine at this time given it can be beneficial for depression/anxiety/insomnia.   The patient demonstrates the following risk factors for suicide: Chronic risk factors for suicide include: psychiatric disorder of depression. Acute risk factors for suicide include: family or marital conflict. Protective factors for this patient include: positive social support, coping skills and hope for the future. Considering these factors, the overall suicide risk at this point appears to be low. Patient is appropriate for outpatient follow up.     Norman Clay, MD 11/8/20212:19 PM

## 2020-10-20 ENCOUNTER — Ambulatory Visit: Payer: Self-pay | Admitting: *Deleted

## 2020-10-21 ENCOUNTER — Other Ambulatory Visit: Payer: Self-pay | Admitting: *Deleted

## 2020-10-21 NOTE — Patient Outreach (Signed)
[facility]) Care Management  10/21/2020  [name] [birth year] [id number]   CSW attempted to reach pt by phone on 11/05 and was unable.  CSW left a HIPPA compliant voice message and will await callback or try again in 3-4 business days.   Eduard Clos, MSW, [place] Worker  [place] [phone]

## 2020-10-23 ENCOUNTER — Telehealth (INDEPENDENT_AMBULATORY_CARE_PROVIDER_SITE_OTHER): Payer: Medicare Other | Admitting: Psychiatry

## 2020-10-23 ENCOUNTER — Other Ambulatory Visit: Payer: Self-pay

## 2020-10-23 ENCOUNTER — Encounter: Payer: Self-pay | Admitting: Psychiatry

## 2020-10-23 ENCOUNTER — Ambulatory Visit (INDEPENDENT_AMBULATORY_CARE_PROVIDER_SITE_OTHER): Payer: Medicare Other | Admitting: Clinical

## 2020-10-23 ENCOUNTER — Telehealth (HOSPITAL_COMMUNITY): Payer: Medicare Other | Admitting: Psychiatry

## 2020-10-23 DIAGNOSIS — F330 Major depressive disorder, recurrent, mild: Secondary | ICD-10-CM | POA: Diagnosis not present

## 2020-10-23 DIAGNOSIS — F331 Major depressive disorder, recurrent, moderate: Secondary | ICD-10-CM

## 2020-10-23 MED ORDER — BUPROPION HCL ER (XL) 150 MG PO TB24: 150.0000 mg | ORAL_TABLET | Freq: Every day | ORAL | 1 refills | Status: DC

## 2020-10-23 NOTE — Progress Notes (Signed)
Virtual Visit via Video Note  I connected with [name] on 10/23/20 at  1:00 PM EST by a video enabled telemedicine application and verified that I am speaking with the correct person using two identifiers.  Location: Patient: Home Provider: Office   I discussed the limitations of evaluation and management by telemedicine and the availability of in person appointments. The patient expressed understanding and agreed to proceed.     Comprehensive Clinical Assessment (CCA) Note  10/23/2020 [name] [id number]  Chief Complaint: Depression Visit Diagnosis: Depression   CCA Screening, Triage and Referral (STR)  Patient Reported Information How did you hear about [place]? No data recorded Referral name: No data recorded Referral phone number: No data recorded  Whom do you see for routine medical problems? No data recorded Practice/Facility Name: No data recorded Practice/Facility Phone Number: No data recorded Name of Contact: No data recorded Contact Number: No data recorded Contact Fax Number: No data recorded Prescriber Name: No data recorded Prescriber Address (if known): No data recorded  What Is the Reason for Your Visit/Call Today? No data recorded How Long Has This Been Causing You Problems? No data recorded What Do You Feel Would Help You the Most Today? No data recorded  Have You Recently Been in Any Inpatient Treatment (Hospital/Detox/Crisis Center/28-Day Program)? No data recorded Name/Location of Program/Hospital:No data recorded How Long Were You There? No data recorded When Were You Discharged? No data recorded  Have You Ever Received Services From [facility] Before? No data recorded Who Do You See at [facility]? No data recorded  Have You Recently Had Any Thoughts About Hurting Yourself? No data recorded Are You Planning to Commit Suicide/Harm Yourself At This time? No data recorded  Have you Recently Had Thoughts About [place]?  No data recorded Explanation: No data recorded  Have You Used Any Alcohol or Drugs in the Past 24 Hours? No data recorded How Long Ago Did You Use Drugs or Alcohol? No data recorded What Did You Use and How Much? No data recorded  Do You Currently Have a Therapist/Psychiatrist? No data recorded Name of Therapist/Psychiatrist: No data recorded  Have You Been Recently Discharged From Any Office Practice or Programs? No data recorded Explanation of Discharge From Practice/Program: No data recorded    CCA Screening Triage Referral Assessment Type of Contact: No data recorded Is this Initial or Reassessment? No data recorded Date Telepsych consult ordered in [facility]:  No data recorded Time Telepsych consult ordered in [facility]:  No data recorded  Patient Reported Information Reviewed? No data recorded Patient Left Without Being Seen? No data recorded Reason for Not Completing Assessment: No data recorded  Collateral Involvement: No data recorded  Does Patient Have a [place]? No data recorded Name and Contact of Legal Guardian: No data recorded If Minor and Not Living with Parent(s), Who has Custody? No data recorded Is CPS involved or ever been involved? No data recorded Is APS involved or ever been involved? No data recorded  Patient Determined To Be At Risk for Harm To Self or Others Based on Review of Patient Reported Information or Presenting Complaint? No data recorded Method: No data recorded Availability of Means: No data recorded Intent: No data recorded Notification Required: No data recorded Additional Information for Danger to Others Potential: No data recorded Additional Comments for Danger to Others Potential: No data recorded Are There Guns or Other Weapons in Your Home? No data recorded Types of Guns/Weapons: No data  recorded Are These Weapons Safely Secured?                            No data recorded Who Could Verify You Are Able To Have These  Secured: No data recorded Do You Have any Outstanding Charges, Pending Court Dates, Parole/Probation? No data recorded Contacted To Inform of Risk of Harm To Self or Others: No data recorded  Location of Assessment: No data recorded  Does Patient Present under Involuntary Commitment? No data recorded IVC Papers Initial File Date: No data recorded  [place] of Residence: No data recorded  Patient Currently Receiving the Following Services: No data recorded  Determination of Need: No data recorded  Options For Referral: No data recorded    CCA Biopsychosocial  Intake/Chief Complaint:  The patient notes, " I need coping skills to help me with mood".   Patient Reported Schizophrenia/Schizoaffective Diagnosis in Past: No   Mental Health Symptoms Depression:  Change in energy/activity;Difficulty Concentrating;Hopelessness;Sleep (too much or little);Irritability   Duration of Depressive symptoms: Greater than two weeks   Mania:  None   Anxiety:   None   Psychosis:  None   Duration of Psychotic symptoms: No data recorded  Trauma:  None   Obsessions:  None   Compulsions:  None   Inattention:  None   Hyperactivity/Impulsivity:  N/A   Oppositional/Defiant Behaviors:  None   Emotional Irregularity:  None   Other Mood/Personality Symptoms:  NA    Mental Status Exam Appearance and self-care  Stature:  Average   Weight:  Overweight   Clothing:  Casual   Grooming:  Normal   Cosmetic use:  Age appropriate   Posture/gait:  Normal   Motor activity:  Not Remarkable   Sensorium  Attention:  Normal   Concentration:  Normal   Orientation:  X5   Recall/memory:  Normal   Affect and Mood  Affect:  Appropriate   Mood:  Depressed   Relating  Eye contact:  Normal   Facial expression:  Depressed;Responsive   Attitude toward examiner:  Cooperative   Thought and Language  Speech flow: Normal   Thought content:  Appropriate to Mood and Circumstances    Preoccupation:  None   Hallucinations:  None   Organization:  No data recorded  [employer] of Knowledge:  Good   Intelligence:  Average   Abstraction:  Normal   Judgement:  Good   Reality Testing:  Realistic   Insight:  Good   Decision Making:  Normal   Social Functioning  Social Maturity:  Responsible   Social Judgement:  Normal   Stress  Stressors:  Family conflict;Relationship   Coping Ability:  Normal   Skill Deficits:  None   Supports:  Church;Friends/Service system      Religion: Religion/Spirituality Are You A Religious Person?: Yes What is Your Religious Affiliation?: Christian How Might This Affect Treatment?: Protective Factor  Leisure/Recreation: Leisure / Recreation Do You Have Hobbies?: No  Exercise/Diet: Exercise/Diet Do You Exercise?: No Have You Gained or Lost A Significant Amount of Weight in the Past Six Months?: No Do You Follow a Special Diet?: No Do You Have Any Trouble Sleeping?: Yes Explanation of Sleeping Difficulties: Difficulty falling asleep   CCA Employment/Education  Employment/Work Situation: Employment / Work Situation Employment situation: On disability Why is patient on disability: Physical Health diverticulitis How long has patient been on disability: 22yr Patient's job has been impacted by current illness: No  What is the longest time patient has a held a job?: 30yrs Where was the patient employed at that time?: [employer] Has patient ever been in the [employer]?: No  Education: Education Is Patient Currently Attending School?: No Last Grade Completed: 12 Did Teacher, adult education From [employer]?: No Did You Nutritional therapist?: Yes 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  - [employer] Did [place]?: No What Was Your Major?: NA Did You Have Any Special Interests In School?: NA Did You Have An Individualized Education Program (IIEP):  No Did You Have Any Difficulty At School?: No Patient's Education Has Been Impacted by Current Illness: No   CCA Family/Childhood History  Family and Relationship History: Family history Marital status: Married Number of Years Married: 9 What types of issues is patient dealing with in the relationship?: The patient notes being overwhelmed by the demands of caregiving for her husband who suffered from chronic COPD Additional relationship information: No Additional Are you sexually active?: No What is your sexual orientation?: Heterosexual Has your sexual activity been affected by drugs, alcohol, medication, or emotional stress?: NA Does patient have children?: Yes How many children?: 2 How is patient's relationship with their children?: The patient notes she has a great relationship with her children  Childhood History:  Childhood History By whom was/is the patient raised?: Both parents Additional childhood history information: The patient notes she was raised by her Mother and Father Description of patient's relationship with caregiver when they were a child: The patient notes, " I had a good relationship with my Mother and Father". Patient's description of current relationship with people who raised him/her: The patient notes both of her parents are deceased How were you disciplined when you got in trouble as a child/adolescent?: Spankings/Grounding Does patient have siblings?: Yes Number of Siblings: 7 Description of patient's current relationship with siblings: The patient notes i have a good relationship with my siblings Did patient suffer any verbal/emotional/physical/sexual abuse as a child?: No Did patient suffer from severe childhood neglect?: No Has patient ever been sexually abused/assaulted/raped as an adolescent or adult?: No Was the patient ever a victim of a crime or a disaster?: No Witnessed domestic violence?: No Has patient been affected by domestic violence as an  adult?: No  Child/Adolescent Assessment:     CCA Substance Use  Alcohol/Drug Use: Alcohol / Drug Use Pain Medications: See MAR Prescriptions: See MAR Over the Counter: See MAR History of alcohol / drug use?: No history of alcohol / drug abuse Longest period of sobriety (when/how long): NA                         ASAM's:  Six Dimensions of Multidimensional Assessment  Dimension 1:  Acute Intoxication and/or Withdrawal Potential:      Dimension 2:  Biomedical Conditions and Complications:      Dimension 3:  Emotional, Behavioral, or Cognitive Conditions and Complications:     Dimension 4:  Readiness to Change:     Dimension 5:  Relapse, Continued use, or Continued Problem Potential:     Dimension 6:  Recovery/Living Environment:     ASAM Severity Score:    ASAM Recommended Level of Treatment:     Substance use Disorder (SUD)    Recommendations for Services/Supports/Treatments: Recommendations for Services/Supports/Treatments Recommendations For Services/Supports/Treatments: Individual Therapy  DSM5 Diagnoses: Patient Active Problem List   Diagnosis Date Noted  . Lower abdominal pain 04/12/2018  .  Constipation 04/12/2018  . Ileus ([name]) 04/12/2018  . SBO (small bowel obstruction) ([place]) 04/10/2018  . Rectal polyp   . Hiatal hernia   . Nausea without vomiting 01/15/2016  . Rectal bleeding 01/15/2016  . Suprapubic pain 04/09/2011  . Abnormal CT of the abdomen 04/09/2011  . Weight loss, abnormal 04/09/2011  . DYSPHAGIA UNSPECIFIED 07/18/2010  . NAUSEA AND VOMITING 07/04/2010  . HYPERLIPIDEMIA 03/02/2010  . DYSPNEA 03/02/2010  . GI BLEEDING 11/23/2009  . [place] DISEASE, LUMBOSACRAL SPINE 08/03/2009  . SPINAL STENOSIS 07/24/2009  . SCIATICA 07/03/2009  . ASEPTIC NECROSIS 07/03/2009  . ILEITIS 06/13/2009  . ANEMIA, NORMOCYTIC 03/27/2009  . IBS 03/27/2009  . FATTY LIVER DISEASE 03/27/2009  . NECK PAIN, CHRONIC 03/27/2009  . DIARRHEA 03/27/2009  .  ABDOMINAL PAIN, CHRONIC 03/27/2009  . ANXIETY 03/24/2009  . ALCOHOL USE 03/24/2009  . SMOKER 03/24/2009  . Essential hypertension 03/24/2009  . Internal hemorrhoids 03/24/2009  . SCHATZKI'S RING 03/24/2009  . GERD 03/24/2009  . DIVERTICULITIS, HX OF 03/24/2009    Patient Centered Plan: Patient is on the following Treatment Plan(s): Depression  Referrals to Alternative Service(s): Referred to Alternative Service(s):   Place:   Date:   Time:    Referred to Alternative Service(s):   Place:   Date:   Time:    Referred to Alternative Service(s):   Place:   Date:   Time:    Referred to Alternative Service(s):   Place:   Date:   Time:     I discussed the assessment and treatment plan with the patient. The patient was provided an opportunity to ask questions and all were answered. The patient agreed with the plan and demonstrated an understanding of the instructions.   The patient was advised to call back or seek an in-person evaluation if the symptoms worsen or if the condition fails to improve as anticipated.  I provided 60 minutes of non-face-to-face time during this encounter.  [name], LCSW   10/23/2020

## 2020-10-23 NOTE — Addendum Note (Signed)
Addended by: Norman Clay on: 10/23/2020 02:19 PM   Modules accepted: Orders

## 2020-10-25 ENCOUNTER — Other Ambulatory Visit: Payer: Self-pay | Admitting: *Deleted

## 2020-10-25 NOTE — Patient Outreach (Signed)
[facility]) Care Management  10/25/2020  [name] 1961-03-05 [id number]   CSW contacted pt for follow up and was able to speak with her by phone. Pt reports she was able to speak with the Psychiatrist and Counselor for initial visit/assessment (virtual).  Pt   Pt voiced a stronger awareness and acceptance of her mental health needs; she is optimistic and states, "there is hope".   She has had some headaches and feels this is due to home "stress and pressure".  She has begun to think of ways to allow herself an outlet; whether its driving to the store (for a break) or sitting outside/in the car,etc.   She has follow up appointments scheduled for 11/29 (with Psychiatry virtually) and 11/27/2020 with counselor.  Pt had difficulty having private time in the home with her husband so she is planning to "drive to the store" and do it in the parking lot there in her car.   Pt also shared about her twin sister who lives nearby in [place], [place] who has a husband also dealing with similar decline and needs.  Pt has felt comfort in conversations they have shared; relating to one another and offering support. Pt did also talk with pt about continuing to find ways to get breaks, find validation/support and assistance with caregiving.  Pt appreciative of the support.   CSW reminded pt to call if needs arise prior to the CSW's next outreach call.     Eduard Clos, MSW, [place] Worker  [place] [phone]

## 2020-10-26 ENCOUNTER — Other Ambulatory Visit: Payer: Self-pay | Admitting: *Deleted

## 2020-10-26 NOTE — Patient Outreach (Signed)
[facility]) Care Management  10/26/2020  [name] 18-Jan-1961 [id number]   RN Health Coach attempted follow up outreach call to patient.  Patient was unavailable. HIPPA compliance voicemail message left with return callback number.  Plan: RN will call patient again within 30 days.  [place] Care Management [phone]

## 2020-11-13 ENCOUNTER — Other Ambulatory Visit: Payer: Self-pay

## 2020-11-13 ENCOUNTER — Ambulatory Visit (INDEPENDENT_AMBULATORY_CARE_PROVIDER_SITE_OTHER): Payer: Medicare Other | Admitting: Clinical

## 2020-11-13 DIAGNOSIS — F331 Major depressive disorder, recurrent, moderate: Secondary | ICD-10-CM | POA: Diagnosis not present

## 2020-11-13 NOTE — Progress Notes (Signed)
  Virtual Visit via Video Note  I connected with [name] on 11/13/20 at  1:00 PM EST by a video enabled telemedicine application and verified that I am speaking with the correct person using two identifiers.     THERAPY PROGRESS NOTE  Session Time:1:00PM-1:45PM  Participation Level:Active  Behavioral Response:CasualAlertDepressed  Type of Therapy:Individual Therapy  Treatment Goals addressed:Anger and Coping  Interventions:CBT, Motivational Interviewing, Solution Focused and Strength-based  Summary:Stacey W. Waltersis a 59 y.o.femalewho presents with Depression.The OPT therapist worked with thepatientfor herinitialOPT treatment. The OPT therapist utilized Motivational Interviewing to assist in creating therapeutic repore. The patient in the session was engaged and work in Science writer about hertriggers and symptoms over the past few weeksincluding recent holiday and interactions with her husband.The OPT therapist utilized Cognitive Behavioral Therapy through cognitive restructuring as well as worked with the patient on coping strategies to assist in management ofmood.The OPT therapist worked with the patient providing support and psycho-education.  Suicidal/Homicidal:Nowithout intent/plan  Therapist Response:The OPT therapist worked with the patient for the patients scheduled session. The patient was engaged in hersession and gave feedback in relation to triggers, symptoms, and behavior responses over the pastfewweeks. The OPT therapist worked with the patient utilizing an in session Cognitive Behavioral Therapy exercise. The patient was responsive in the session and verbalized, "Ineed to take a break sometimes and be around uplifting people".The OPT therapist gave support and worked with the patient on communication with her husband. The OPT therapist will continue treatment work with the patient in her next scheduled  session  Plan: Return again in2/3weeks.  Diagnosis:Axis I:Major Depressive Disorder, Recurrent, Moderate  Axis II:No diagnosis  I discussed the assessment and treatment plan with the patient. The patient was provided an opportunity to ask questions and all were answered. The patient agreed with the plan and demonstrated an understanding of the instructions.  The patient was advised to call back or seek an in-person evaluation if the symptoms worsen or if the condition fails to improve as anticipated.  I provided78minutes of non-face-to-face time during this encounter.  [name], LCSW 11/13/2020

## 2020-11-14 ENCOUNTER — Ambulatory Visit: Payer: Self-pay | Admitting: *Deleted

## 2020-11-15 ENCOUNTER — Other Ambulatory Visit: Payer: Self-pay | Admitting: *Deleted

## 2020-11-15 NOTE — Patient Outreach (Signed)
[facility]) Care Management  11/15/2020  [name] 25-Sep-1961 [id number]   CSW attempted  Outreach to pt on 11/14/2020 and was unable to reach-  Voicemail message left.  CSW will await callback or make a second outreach call in 3-4 business days if no return call is received.    Eduard Clos, MSW, [place] Worker  [place] [phone]

## 2020-11-17 ENCOUNTER — Other Ambulatory Visit: Payer: Self-pay | Admitting: *Deleted

## 2020-11-17 ENCOUNTER — Ambulatory Visit: Payer: Self-pay | Admitting: *Deleted

## 2020-11-17 NOTE — Progress Notes (Signed)
Virtual Visit via Video Note  I connected with [name] on 11/27/20 at 10:20 AM EST by a video enabled telemedicine application and verified that I am speaking with the correct person using two identifiers.  Location: Patient: car Provider: office Persons participated in the visit- patient, provider   I discussed the limitations of evaluation and management by telemedicine and the availability of in person appointments. The patient expressed understanding and agreed to proceed.   I discussed the assessment and treatment plan with the patient. The patient was provided an opportunity to ask questions and all were answered. The patient agreed with the plan and demonstrated an understanding of the instructions.   The patient was advised to call back or seek an in-person evaluation if the symptoms worsen or if the condition fails to improve as anticipated.  I provided 18 minutes of non-face-to-face time during this encounter.   Norman Clay, MD    [facility] MD/PA/NP OP Progress Note  11/27/2020 10:50 AM [name]  MRN:  [id number]  Chief Complaint:  Chief Complaint    Follow-up; Depression     HPI:  This is a follow-up appointment for depression.  She states that she continues to feel the same.  Although she has been trying to go out for shopping, she tends to feel the same as soon as she comes back to home.  She struggles with her husband, who repeats things many times.  She agrees to discuss with his PCP if he has any issues with memory.  She finds therapy to be helpful; she tries to figure out how to escape from things which makes her feel angry.  She understands that she can make change in how to respond to stress while she may not be able to change the situation.  She states that she tends to feel sad around the holiday.  On further explanation, she wonders if it might be related as she and her husband used to do many things in holidays, which they cannot do anymore.  She  agrees to try finding out things to have more quality time with her husband.  She has insomnia.  She feels fatigue.  Although she did try holiday decoration, she did not enjoy it anymore.  She has fair concentration.  She has gained weight, which she attributes to immobility, and eating unhealthy diet.  She denies SI.  She denies any side effect from bupropion.   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 Used to work at [employer] before pandemic Exercise: She stopped going to [employer] due to pandemic. She does not feel safe to go outside by herself. Support: her daughter, church family Household: husband with COPD Marital status: 47 years/12 years of relationship before marriage, divorced in 03-12-1978 Number of children: 2 from her previous marriage (1 son, 1 daughter), 2 from his marriage.  She had "great" relationship with her mother. She used to take care of her mother, who  deceased in 12-Sep-2018 (two hours after her grandmother deceased). Her father deceased in 03/12/02. She has seven siblings.  Visit Diagnosis:    ICD-10-CM   1. MDD (major depressive disorder), recurrent episode, mild ([place])  F33.0   2. Insomnia, unspecified type  G47.00 Ambulatory referral to Neurology    Past Psychiatric History: Please see initial evaluation for full details. I have reviewed the history. No updates at this time.     Past Medical History:  Past Medical History:  Diagnosis Date  . Anxiety   . Asthma   . Chronic back pain   . Depression   . Diverticulitis    complicated by abscess requiring total colectomy and take-down of colostomy in 2004  . GERD (gastroesophageal reflux disease)   . Hemorrhoids   . Hiatal hernia   . HTN (hypertension)   . Hypothyroidism   . IBS (irritable bowel syndrome)   . Migraine   . Neck pain, chronic   . Obesity   . Polyp of colon, hyperplastic   . [facility] spotted fever   . Terminal ileitis of small intestine ([place])     referred to [facility], Crohn's ruled out  . Thyroid disease     Past Surgical History:  Procedure Laterality Date  . APPENDECTOMY    . CHOLECYSTECTOMY  2004   at time of colectomy  . COLONOSCOPY  03/2009   anastomotic ulcers and neoterminal ileum ulcers, anastomosis with SB at 30cm. bx s/o Crohn's  . COLONOSCOPY  2005   Dr. Irving Shows: rectal ulcers  . COLONOSCOPY  2011   [facility]  . COLONOSCOPY N/A 01/30/2016   Dr.Rourk- internal hemorrhoids, likely source of hematochezia, rectal polyps removed s/p subtotal colectomy with normal appearing residual colonic and small bowel mucosa bx= hyperplasticpolyp  . ESOPHAGOGASTRODUODENOSCOPY  02/2009   mild erosive reflux esophagitis  . ESOPHAGOGASTRODUODENOSCOPY  07/2010   probable cervical esophageal web s/p dilation  . ESOPHAGOGASTRODUODENOSCOPY N/A 01/30/2016   Dr.Rourk- normal apearing patent tubular esophagus, hiatal hernia  . ethmoid mucocele, turbinate surgery  2002   right  . GIVENS CAPSULE STUDY  11/2009   Dr. Gala Romney: subtle erytherma and edema of TI  . HEMORRHOID BANDING  2017   Dr.Rourk  . hysterectomy  2009   partial  . LIVER BIOPSY  2004   steatosis, at time of colectomy  . SUBTOTAL COLECTOMY  2004   diverticulitis with abscess  . UMBILICAL HERNIA REPAIR      Family Psychiatric History: Please see initial evaluation for full details. I have reviewed the history. No updates at this time.     Family History:  Family History  Problem Relation Age of Onset  . ALS Father   . Crohn's disease Daughter   . Diabetes Mother   . Rheum arthritis Mother     Social History:  Social History   Socioeconomic History  . Marital status: Married    Spouse name: Not on file  . Number of children: 2  . Years of education: Not on file  . Highest education level: Not on file  Occupational History    Employer: UNEMPLOYED  Tobacco Use  . Smoking status: Former Smoker    Packs/day: 0.50    Years: 10.00    Pack years: 5.00    Types:  Cigarettes    Quit date: 12/15/2006    Years since quitting: 13.9  . Smokeless tobacco: Never Used  Vaping Use  . Vaping Use: Never used  Substance and Sexual Activity  . Alcohol use: Not Currently    Alcohol/week: 0.0 standard drinks  . Drug use: No  . Sexual activity: Yes    Birth control/protection: Surgical  Other Topics Concern  . Not on file  Social History Narrative  . Not on file   Social Determinants of Health   Financial Resource Strain: Not on file  Food Insecurity: No Food Insecurity  . Worried About Charity fundraiser in the Last Year: Never true  . Ran Out of Food  in the Last Year: Never true  Transportation Needs: No Transportation Needs  . Lack of Transportation (Medical): No  . Lack of Transportation (Non-Medical): No  Physical Activity: Not on file  Stress: Not on file  Social Connections: Not on file    Allergies:  Allergies  Allergen Reactions  . Hydromorphone Hcl Itching    REACTION: Dilaudid    Metabolic Disorder Labs: Lab Results  Component Value Date   HGBA1C 6.4 (H) 06/03/2011   MPG 137 (H) 06/03/2011   MPG 134 02/22/2009   No results found for: PROLACTIN No results found for: CHOL, TRIG, HDL, CHOLHDL, VLDL, LDLCALC Lab Results  Component Value Date   TSH 0.389 04/09/2011    Therapeutic Level Labs: No results found for: LITHIUM No results found for: VALPROATE No components found for:  CBMZ  Current Medications: Current Outpatient Medications  Medication Sig Dispense Refill  . buPROPion (WELLBUTRIN XL) 300 MG 24 hr tablet Take 1 tablet (300 mg total) by mouth daily. 30 tablet 1  . dicyclomine (BENTYL) 10 MG capsule Take 10 mg by mouth 4 (four) times daily -  before meals and at bedtime.      . gabapentin (NEURONTIN) 300 MG capsule Take 300 mg by mouth 3 (three) times daily.     Marland Kitchen HYDROcodone-acetaminophen (NORCO/VICODIN) 5-325 MG tablet Take 1 tablet by mouth every 6 (six) hours as needed for moderate pain. 12 tablet 0  .  indomethacin (INDOCIN) 25 MG capsule Take 1 capsule (25 mg total) by mouth 3 (three) times daily as needed. 21 capsule 0  . levothyroxine (SYNTHROID, LEVOTHROID) 25 MCG tablet Take 25 mcg by mouth daily.    . metFORMIN (GLUCOPHAGE) 500 MG tablet Take 500 mg by mouth daily.    . QUEtiapine (SEROQUEL) 50 MG tablet Take 50 mg by mouth at bedtime.    . simvastatin (ZOCOR) 10 MG tablet Take 10 mg by mouth daily.    Marland Kitchen spironolactone (ALDACTONE) 50 MG tablet Take 50 mg by mouth 2 (two) times daily.      No current facility-administered medications for this visit.     Musculoskeletal: Strength & Muscle Tone: N/A Gait & Station: N/A Patient leans: N/A  Psychiatric Specialty Exam: Review of Systems  Psychiatric/Behavioral: Positive for dysphoric mood. Negative for agitation, behavioral problems, confusion, decreased concentration, hallucinations, self-injury, sleep disturbance and suicidal ideas. The patient is not nervous/anxious and is not hyperactive.   All other systems reviewed and are negative.   There were no vitals taken for this visit.There is no height or weight on file to calculate BMI.  General Appearance: Fairly Groomed  Eye Contact:  Good  Speech:  Clear and Coherent  Volume:  Normal  Mood:  Depressed  Affect:  Appropriate, Congruent and slightly down  Thought Process:  Coherent  Orientation:  Full (Time, Place, and Person)  Thought Content: Logical   Suicidal Thoughts:  No  Homicidal Thoughts:  No  Memory:  Immediate;   Good  Judgement:  Good  Insight:  Good  Psychomotor Activity:  Normal  Concentration:  Concentration: Good and Attention Span: Good  Recall:  Good  Fund of Knowledge: Good  Language: Good  Akathisia:  No  Handed:  Right  AIMS (if indicated): not done  Assets:  Communication Skills Desire for Improvement  ADL's:  Intact  Cognition: WNL  Sleep:  Good   Screenings: PHQ2-9   Flowsheet Row Patient Outreach Telephone from 06/22/2020 in [place] Coordination  PHQ-2 Total  Score 2  PHQ-9 Total Score 4       Assessment and Plan:  [name] is a 59 y.o. year old female with a history of depression, anxiety, diabetes, who presents for follow up appointment for below.   1. 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  Will uptitrate bupropion to optimize treatment for depression.  She has no known history of seizure.  Will continue quetiapine at this time as adjunctive treatment for depression; discussed potential metabolic side effect and EPS. She is willing to work on healthy diet and regular exercise to mitigate weight gain.  She will continue to see Mr. Eulas Post for therapy.   2. Insomnia, unspecified type She has history of snoring, and has insomnia and fatigue.  Will make referral for sleep evaluation.   Plan 1. Increase bupropion 300 mg daily  2. Next appointment: 12/13 at 10:20 for 30 mins, video 3. Referral for sleep evaluation - on quetiapine 50 mg qhs, gabapentin 300 mg TID, prescribed by PCP   - She may be on imipramine according to Epic for unknown indication. Will obtain medication list from the pharmacy.    The patient demonstrates the following risk factors for suicide: Chronic risk factors for suicide include: psychiatric disorder of depression. Acute risk factors for suicide include: family or marital conflict. Protective factors for this patient include: positive social support, coping skills and hope for the future. Considering these factors, the overall suicide risk at this point appears to be low. Patient is appropriate for outpatient follow up.     Norman Clay, MD 11/27/2020, 10:50 AM

## 2020-11-17 NOTE — Patient Outreach (Signed)
[place] [facility]) Care Management  11/17/2020  ZAKYRA KUKUK [birth year] [id number]   CSW attempted follow up call to pt and was unable to reach. CSW was able to leave a HIPPA compliant voice message and CSW will await cllback or try again in 3-4 business days.  Eduard Clos, MSW, [place] Worker  [place] [phone]

## 2020-11-22 ENCOUNTER — Ambulatory Visit: Payer: Self-pay | Admitting: *Deleted

## 2020-11-24 ENCOUNTER — Other Ambulatory Visit: Payer: Self-pay | Admitting: *Deleted

## 2020-11-24 ENCOUNTER — Ambulatory Visit: Payer: Self-pay | Admitting: *Deleted

## 2020-11-24 NOTE — Patient Outreach (Addendum)
[place] [facility]) Care Management  11/24/2020  RAFIA SHEDDEN [birth year] [id number]   CSW attempted to reach pt on 11/22/2020 and was unable.  CSW was able to leave a HIPPA compliant voice message.  CSW will await call back or try again per policy.     Eduard Clos, MSW, [place] Worker  [place] [phone]

## 2020-11-27 ENCOUNTER — Other Ambulatory Visit: Payer: Self-pay

## 2020-11-27 ENCOUNTER — Telehealth (INDEPENDENT_AMBULATORY_CARE_PROVIDER_SITE_OTHER): Payer: Medicare Other | Admitting: Psychiatry

## 2020-11-27 ENCOUNTER — Encounter: Payer: Self-pay | Admitting: Psychiatry

## 2020-11-27 ENCOUNTER — Ambulatory Visit: Payer: Self-pay | Admitting: *Deleted

## 2020-11-27 DIAGNOSIS — G4700 Insomnia, unspecified: Secondary | ICD-10-CM | POA: Diagnosis not present

## 2020-11-27 DIAGNOSIS — F330 Major depressive disorder, recurrent, mild: Secondary | ICD-10-CM | POA: Diagnosis not present

## 2020-11-27 MED ORDER — BUPROPION HCL ER (XL) 300 MG PO TB24: 300.0000 mg | ORAL_TABLET | Freq: Every day | ORAL | 1 refills | Status: DC

## 2020-11-27 NOTE — Patient Instructions (Signed)
1. Increase bupropion 300 mg daily  2. Next appointment: 12/13 at 10:20

## 2020-11-29 ENCOUNTER — Ambulatory Visit: Payer: Self-pay | Admitting: *Deleted

## 2020-11-29 ENCOUNTER — Other Ambulatory Visit: Payer: Self-pay | Admitting: *Deleted

## 2020-11-29 NOTE — Patient Outreach (Signed)
[place] [facility]) Care Management  11/29/2020  TEREN FRANCKOWIAK Mar 23, 1961 [id number]   RN Health Coach attempted follow up outreach call to patient.  Patient was unavailable. HIPPA compliance voicemail message left with return callback number.  Plan: RN will call patient again within 30 days.  [place] Care Management [phone]

## 2020-12-06 ENCOUNTER — Ambulatory Visit (INDEPENDENT_AMBULATORY_CARE_PROVIDER_SITE_OTHER): Payer: Medicare Other | Admitting: Clinical

## 2020-12-06 ENCOUNTER — Other Ambulatory Visit: Payer: Self-pay

## 2020-12-06 DIAGNOSIS — F331 Major depressive disorder, recurrent, moderate: Secondary | ICD-10-CM

## 2020-12-06 NOTE — Progress Notes (Addendum)
  Virtual Visit via Video Note  I connected withBonita SOPHIAMARIE Gill on 12/06/20 at  1:00 PM EST by a video enabled telemedicine application and verified that I am speaking with the correct person using two identifiers.  Location: Patient:Home Provider:Office  I discussed the limitations of evaluation and management by telemedicine and the availability of in person appointments. The patient expressed understanding and agreed to proceed                    THERAPY PROGRESS NOTE  Session Time:1:00PM-1:40PM  Participation Level:Active  Behavioral Response:CasualAlertDepressed  Type of Therapy:Individual Therapy  Treatment Goals addressed:Anger and Coping  Interventions:CBT, Motivational Interviewing, Solution Focused and Strength-based  Summary:Stacey W. Waltersis [id number].femalewho presents withDepression.The OPT therapist worked with thepatientfor herinitialOPT treatment. The OPT therapist utilized Motivational Interviewing to assist in creating therapeutic repore. The patient in the session was engaged and work in Science writer about hertriggers and symptoms over the past few weeksincluding holiday preparations and interactions with her husband.The OPT therapist utilized Cognitive Behavioral Therapy through cognitive restructuring as well as worked with the patient on coping strategies to assist in management ofmood.The OPT therapist worked with the patient providing support andpsycho-education.  Suicidal/Homicidal:Nowithout intent/plan  Therapist Response:The OPT therapist worked with the patient for the patients scheduled session. The patient was engaged in hersession and gave feedback in relation to triggers, symptoms, and behavior responses over the pastfewweeks. The OPT therapist worked with the patient utilizing an in session Cognitive Behavioral Therapy exercise. The patient was responsive in the session and verbalized,  "Iintend on being better to myself and be active I am looking forward to 2022".The OPT therapistgave support and worked with the patient on communication with her husband. The OPT therapist will continue treatment work with the patient in her next scheduled session  Plan: Return again in2/3weeks.  Diagnosis:Axis I:Major Depressive Disorder, Recurrent, Moderate  Axis II:No diagnosis  I discussed the assessment and treatment plan with the patient. The patient was provided an opportunity to ask questions and all were answered. The patient agreed with the plan and demonstrated an understanding of the instructions.  The patient was advised to call back or seek an in-person evaluation if the symptoms worsen or if the condition fails to improve as anticipated.  I provided22minutes of non-face-to-face time during this encounter.  [name], LCSW 12/06/2020

## 2020-12-22 ENCOUNTER — Other Ambulatory Visit: Payer: Self-pay | Admitting: *Deleted

## 2020-12-22 ENCOUNTER — Ambulatory Visit: Payer: Self-pay | Admitting: *Deleted

## 2020-12-22 NOTE — Patient Outreach (Addendum)
[place] [facility]) Care Management  12/22/2020  RILEY PAPIN 1961-04-09 [id number]   CSW attempted to reach pt and was able to leave a voice message for return call. CSW will plan to outreach again in 3-4 business days if no return call is received.     Eduard Clos, MSW, [place] Worker  [place] [phone]

## 2020-12-26 ENCOUNTER — Ambulatory Visit: Payer: Self-pay | Admitting: *Deleted

## 2020-12-26 NOTE — Progress Notes (Signed)
Virtual Visit via Video Note  I connected with [name] on 01/01/21 at 11:00 AM EST by a video enabled telemedicine application and verified that I am speaking with the correct person using two identifiers.  Location: Patient: home Provider: office Persons participated in the visit- patient, provider   I discussed the limitations of evaluation and management by telemedicine and the availability of in person appointments. The patient expressed understanding and agreed to proceed.     I discussed the assessment and treatment plan with the patient. The patient was provided an opportunity to ask questions and all were answered. The patient agreed with the plan and demonstrated an understanding of the instructions.   The patient was advised to call back or seek an in-person evaluation if the symptoms worsen or if the condition fails to improve as anticipated.  I provided 15 minutes of non-face-to-face time during this encounter.   Norman Clay, MD    [facility] MD/PA/NP OP Progress Note  01/01/2021 11:23 AM [name]  MRN:  [id number]  Chief Complaint:  Chief Complaint    Follow-up; Depression     HPI:  This is a follow-up appointment for depression.  She states that she has been doing better. She states that she has learned to accept the situation.  She had an appointment with her husband's provider.  They were told that he has limited time, and his condition will not get better.  She tries to understand the situation, and trying to make things better for him.  Although she struggles at times, she has good support from the church family.  She has started to be more active; doing exercise, reading Bibles, and praying.  Although she states that she has not noticed any difference after up titration of bupropion, she agrees that the medication might have been helpful for her to manage things easily.  She continues to have insomnia, which she attributes to racing thoughts/anxiety.   She has an upcoming appointment with neurologist.  She has started to eat healthier food.  She has fair energy and motivation.  She feels less depressed.  She denies SI.  She feels comfortable to stay on her medication.    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 Used to work at [employer] before pandemic Exercise: She stopped going to [employer] due to pandemic. She does not feel safe to go outside by herself. Support:her daughter, church family Household:husband with COPD Marital status:9 years/12 years of relationship before marriage, divorced in March 29, 1978 Number of children:2 from her previous marriage (1 son, 1 daughter), 2 from his marriage.  She had "great" relationship with her mother. 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 Her father deceased in 03/29/2002. She has seven siblings  Visit Diagnosis:    ICD-10-CM   1. MDD (major depressive disorder), recurrent episode, mild ([place])  F33.0     Past Psychiatric History: Please see initial evaluation for full details. I have reviewed the history. No updates at this time.     Past Medical History:  Past Medical History:  Diagnosis Date  . Anxiety   . Asthma   . Chronic back pain   . Depression   . Diverticulitis    complicated by abscess requiring total colectomy and take-down of colostomy in 2003/03/30  . GERD (gastroesophageal reflux disease)   . Hemorrhoids   . Hiatal hernia   . HTN (  hypertension)   . Hypothyroidism   . IBS (irritable bowel syndrome)   . Migraine   . Neck pain, chronic   . Obesity   . Polyp of colon, hyperplastic   . [facility] spotted fever   . Terminal ileitis of small intestine ([place])    referred to [facility], Crohn's ruled out  . Thyroid disease     Past Surgical History:  Procedure Laterality Date  . APPENDECTOMY    . CHOLECYSTECTOMY  2004   at time of colectomy  . COLONOSCOPY  03/2009   anastomotic  ulcers and neoterminal ileum ulcers, anastomosis with SB at 30cm. bx s/o Crohn's  . COLONOSCOPY  2005   Dr. Irving Shows: rectal ulcers  . COLONOSCOPY  2011   [facility]  . COLONOSCOPY N/A 01/30/2016   Dr.Rourk- internal hemorrhoids, likely source of hematochezia, rectal polyps removed s/p subtotal colectomy with normal appearing residual colonic and small bowel mucosa bx= hyperplasticpolyp  . ESOPHAGOGASTRODUODENOSCOPY  02/2009   mild erosive reflux esophagitis  . ESOPHAGOGASTRODUODENOSCOPY  07/2010   probable cervical esophageal web s/p dilation  . ESOPHAGOGASTRODUODENOSCOPY N/A 01/30/2016   Dr.Rourk- normal apearing patent tubular esophagus, hiatal hernia  . ethmoid mucocele, turbinate surgery  2002   right  . GIVENS CAPSULE STUDY  11/2009   Dr. Gala Romney: subtle erytherma and edema of TI  . HEMORRHOID BANDING  2017   Dr.Rourk  . hysterectomy  2009   partial  . LIVER BIOPSY  2004   steatosis, at time of colectomy  . SUBTOTAL COLECTOMY  2004   diverticulitis with abscess  . UMBILICAL HERNIA REPAIR      Family Psychiatric History: Please see initial evaluation for full details. I have reviewed the history. No updates at this time.     Family History:  Family History  Problem Relation Age of Onset  . ALS Father   . Crohn's disease Daughter   . Diabetes Mother   . Rheum arthritis Mother     Social History:  Social History   Socioeconomic History  . Marital status: Married    Spouse name: Not on file  . Number of children: 2  . Years of education: Not on file  . Highest education level: Not on file  Occupational History    Employer: UNEMPLOYED  Tobacco Use  . Smoking status: Former Smoker    Packs/day: 0.50    Years: 10.00    Pack years: 5.00    Types: Cigarettes    Quit date: 12/15/2006    Years since quitting: 14.0  . Smokeless tobacco: Never Used  Vaping Use  . Vaping Use: Never used  Substance and Sexual Activity  . Alcohol use: Not Currently    Alcohol/week:  0.0 standard drinks  . Drug use: No  . Sexual activity: Yes    Birth control/protection: Surgical  Other Topics Concern  . Not on file  Social History Narrative  . Not on file   Social Determinants of Health   Financial Resource Strain: Not on file  Food Insecurity: No Food Insecurity  . Worried About Charity fundraiser in the Last Year: Never true  . Ran Out of Food in the Last Year: Never true  Transportation Needs: No Transportation Needs  . Lack of Transportation (Medical): No  . Lack of Transportation (Non-Medical): No  Physical Activity: Not on file  Stress: Not on file  Social Connections: Not on file    Allergies:  Allergies  Allergen Reactions  . Hydromorphone Hcl Itching  REACTION: Dilaudid    Metabolic Disorder Labs: Lab Results  Component Value Date   HGBA1C 6.4 (H) 06/03/2011   MPG 137 (H) 06/03/2011   MPG 134 02/22/2009   No results found for: PROLACTIN No results found for: CHOL, TRIG, HDL, CHOLHDL, VLDL, LDLCALC Lab Results  Component Value Date   TSH 0.389 04/09/2011    Therapeutic Level Labs: No results found for: LITHIUM No results found for: VALPROATE No components found for:  CBMZ  Current Medications: Current Outpatient Medications  Medication Sig Dispense Refill  . buPROPion (WELLBUTRIN XL) 300 MG 24 hr tablet Take 1 tablet (300 mg total) by mouth daily. 30 tablet 1  . dicyclomine (BENTYL) 10 MG capsule Take 10 mg by mouth 4 (four) times daily -  before meals and at bedtime.      . gabapentin (NEURONTIN) 300 MG capsule Take 300 mg by mouth 2 (two) times daily.    Marland Kitchen HYDROcodone-acetaminophen (NORCO/VICODIN) 5-325 MG tablet Take 1 tablet by mouth every 6 (six) hours as needed for moderate pain. 12 tablet 0  . indomethacin (INDOCIN) 25 MG capsule Take 1 capsule (25 mg total) by mouth 3 (three) times daily as needed. 21 capsule 0  . levothyroxine (SYNTHROID, LEVOTHROID) 25 MCG tablet Take 25 mcg by mouth daily.    . metFORMIN  (GLUCOPHAGE) 500 MG tablet Take 500 mg by mouth daily.    . QUEtiapine (SEROQUEL) 50 MG tablet Take 50 mg by mouth at bedtime.    . simvastatin (ZOCOR) 10 MG tablet Take 10 mg by mouth daily.    Marland Kitchen spironolactone (ALDACTONE) 50 MG tablet Take 50 mg by mouth 2 (two) times daily.      No current facility-administered medications for this visit.     Musculoskeletal: Strength & Muscle Tone: N/A Gait & Station: N/A Patient leans: N/A  Psychiatric Specialty Exam: Review of Systems  Psychiatric/Behavioral: Positive for dysphoric mood and sleep disturbance. Negative for agitation, behavioral problems, confusion, decreased concentration, hallucinations, self-injury and suicidal ideas. The patient is nervous/anxious. The patient is not hyperactive.   All other systems reviewed and are negative.   There were no vitals taken for this visit.There is no height or weight on file to calculate BMI.  General Appearance: Fairly Groomed  Eye Contact:  Good  Speech:  Clear and Coherent  Volume:  Normal  Mood:  better  Affect:  Appropriate, Congruent and calmer  Thought Process:  Coherent  Orientation:  Full (Time, Place, and Person)  Thought Content: Logical   Suicidal Thoughts:  No  Homicidal Thoughts:  No  Memory:  Immediate;   Good  Judgement:  Good  Insight:  Good  Psychomotor Activity:  Normal  Concentration:  Concentration: Good and Attention Span: Good  Recall:  Good  Fund of Knowledge: Good  Language: Good  Akathisia:  No  Handed:  Right  AIMS (if indicated): not done  Assets:  Communication Skills Desire for Improvement  ADL's:  Intact  Cognition: WNL  Sleep:  Poor   Screenings: PHQ2-9   Flowsheet Row Patient Outreach Telephone from 06/22/2020 in [place] Coordination  PHQ-2 Total Score 2  PHQ-9 Total Score 4       Assessment and Plan:  [name] is a 60 y.o. year old female with a history of f depression, anxiety, diabetes, who  presents for follow up appointment for below.   1. MDD (major depressive disorder), recurrent episode, mild ([facility]) There has been overall improvement in depressive  symptoms, which coincided with up titration of bupropion, and her starting self-care.  We will continue current dose of bupropion to target depression.  She has no known history of seizure.  We will continue quetiapine at this time as adjunctive treatment for depression.  She is aware of its risk of metabolic side effect and EPS.  She will continue to see Mr. Eulas Post for therapy.   2. Insomnia, unspecified type She has history of snoring, and has insomnia and fatigue.  Made referral for sleep evaluation.   Plan 1. Continue bupropion 300 mg daily  2. Next appointment: 3/14 at 11 AM for 30 mins, video 3. Referred for sleep evaluation - on quetiapine 50 mg qhs, gabapentin 300 mg BID, prescribed by PCP - TSH is to be checked by her PCP in April   - She may be on imipramine according to Epic for unknown indication. Will obtain medication list from the pharmacy.   The patient demonstrates the following risk factors for suicide: Chronic risk factors for suicide include:psychiatric disorder ofdepression. Acute risk factorsfor suicide include: family or marital conflict. Protective factorsfor this patient include: positive social support, coping skills and hope for the future. Considering these factors, the overall suicide risk at this point appears to below. Patientisappropriate for outpatient follow up.   Norman Clay, MD 01/01/2021, 11:23 AM

## 2020-12-27 ENCOUNTER — Ambulatory Visit (HOSPITAL_COMMUNITY): Payer: Medicare Other | Admitting: Clinical

## 2020-12-27 ENCOUNTER — Other Ambulatory Visit: Payer: Self-pay | Admitting: *Deleted

## 2020-12-27 NOTE — Patient Outreach (Signed)
[facility]) Care Management  12/27/2020  [name] 02-22-1961 [id number]   CSW attempted contact with pt and was unable. CSW was able to leave a HIPPA compliant voice message.  CSW will close referral at this time per policy. CSW will advise PCP and [facility] team of above plans.   Eduard Clos, MSW, [place] Worker  [facility] [phone]

## 2020-12-28 DIAGNOSIS — T24131A Burn of first degree of right lower leg, initial encounter: Secondary | ICD-10-CM | POA: Diagnosis not present

## 2020-12-28 DIAGNOSIS — I10 Essential (primary) hypertension: Secondary | ICD-10-CM | POA: Diagnosis not present

## 2020-12-28 DIAGNOSIS — M5431 Sciatica, right side: Secondary | ICD-10-CM | POA: Diagnosis not present

## 2020-12-28 DIAGNOSIS — Z0000 Encounter for general adult medical examination without abnormal findings: Secondary | ICD-10-CM | POA: Diagnosis not present

## 2020-12-28 DIAGNOSIS — K219 Gastro-esophageal reflux disease without esophagitis: Secondary | ICD-10-CM | POA: Diagnosis not present

## 2020-12-28 DIAGNOSIS — E1143 Type 2 diabetes mellitus with diabetic autonomic (poly)neuropathy: Secondary | ICD-10-CM | POA: Diagnosis not present

## 2020-12-28 DIAGNOSIS — E032 Hypothyroidism due to medicaments and other exogenous substances: Secondary | ICD-10-CM | POA: Diagnosis not present

## 2021-01-01 ENCOUNTER — Encounter: Payer: Self-pay | Admitting: Psychiatry

## 2021-01-01 ENCOUNTER — Telehealth (INDEPENDENT_AMBULATORY_CARE_PROVIDER_SITE_OTHER): Payer: Medicare Other | Admitting: Psychiatry

## 2021-01-01 ENCOUNTER — Other Ambulatory Visit: Payer: Self-pay

## 2021-01-01 DIAGNOSIS — F330 Major depressive disorder, recurrent, mild: Secondary | ICD-10-CM

## 2021-01-01 MED ORDER — BUPROPION HCL ER (XL) 300 MG PO TB24: 300.0000 mg | ORAL_TABLET | Freq: Every day | ORAL | 1 refills | Status: DC

## 2021-01-05 ENCOUNTER — Ambulatory Visit (INDEPENDENT_AMBULATORY_CARE_PROVIDER_SITE_OTHER): Payer: Medicare Other | Admitting: Clinical

## 2021-01-05 ENCOUNTER — Other Ambulatory Visit: Payer: Self-pay

## 2021-01-05 DIAGNOSIS — F331 Major depressive disorder, recurrent, moderate: Secondary | ICD-10-CM

## 2021-01-05 NOTE — Progress Notes (Signed)
  Virtual Visit via Video Note  I connected withBonita W Gill 1/21/22at 8:00 AM ESTby a video enabled telemedicine application and verified that I am speaking with the correct person using two identifiers.  Location: Patient:Home Provider:Office  I discussed the limitations of evaluation and management by telemedicine and the availability of in person appointments. The patient expressed understanding and agreed to proceed    THERAPY PROGRESS NOTE  Session Time:8:00AM-8:40AM  Participation Level:Active  Behavioral Response:CasualAlertDepressed  Type of Therapy:Individual Therapy  Treatment Goals addressed:Anger and Coping  Interventions:CBT, Motivational Interviewing, Solution Focused and Strength-based  Summary:Stacey W. Waltersis U25O.o.femalewho presents withDepression.The OPT therapist worked with thepatientfor herinitialOPT treatment. The OPT therapist utilized Motivational Interviewing to assist in creating therapeutic repore. The patient in the session was engaged and work in Science writer about hertriggers and symptoms over the past few weeksincludingdifficulty regulating sleep and she has set a sleep study to be completed to get more help on regulating her sleep wake cycle.The OPT therapist utilized Cognitive Behavioral Therapy through cognitive restructuring as well as worked with the patient on coping strategies to assist in management ofmood including adding exercise to her routine.The OPT therapist worked with the patient providing support andpsycho-education.  Suicidal/Homicidal:Nowithout intent/plan  Therapist Response:The OPT therapist worked with the patient for the patients scheduled session. The patient was engaged in hersession and gave feedback in relation to triggers, symptoms, and behavior responses over the pastfewweeks. The OPT therapist worked with the patient utilizing an  in session Cognitive Behavioral Therapy exercise. The patient was responsive in the session and verbalized, "Iintend on getting back started in my workout routine at the [facility]". Additionally the patient noted that she is going to participate in a sleep study to help her with regulating her sleep cycle; the patient is currently prescribed a medication that helps pt to sleep (Seroquel).The OPT therapistgave support and worked with the patient on continued work in Cytogeneticist with [place]. The OPT therapist will continue treatment work with the patient in her next scheduled session  Plan: Return again in2/3weeks.  Diagnosis:Axis I:Major Depressive Disorder, Recurrent, Moderate  Axis II:No diagnosis  I discussed the assessment and treatment plan with the patient. The patient was provided an opportunity to ask questions and all were answered. The patient agreed with the plan and demonstrated an understanding of the instructions.  The patient was advised to call back or seek an in-person evaluation if the symptoms worsen or if the condition fails to improve as anticipated.  I provided48minutes of non-face-to-face time during this encounter.  [name], LCSW  01/05/2021

## 2021-01-08 ENCOUNTER — Other Ambulatory Visit: Payer: Self-pay | Admitting: *Deleted

## 2021-01-08 NOTE — Patient Outreach (Signed)
[facility]) Care Management  01/08/2021  KAILEIA FLOW 01/25/1961 [id number]  RN Health Coach attempted follow up outreach call to patient.  Patient was unavailable. HIPPA compliance voicemail message left with return callback number.  Plan: RN will call patient again within 30 days.  [place] Care Management [phone]

## 2021-01-11 ENCOUNTER — Institutional Professional Consult (permissible substitution): Payer: Medicare Other | Admitting: Neurology

## 2021-01-24 ENCOUNTER — Ambulatory Visit (HOSPITAL_COMMUNITY): Payer: Medicare Other | Admitting: Clinical

## 2021-01-30 ENCOUNTER — Other Ambulatory Visit: Payer: Self-pay

## 2021-01-30 ENCOUNTER — Ambulatory Visit (INDEPENDENT_AMBULATORY_CARE_PROVIDER_SITE_OTHER): Payer: Medicare Other | Admitting: Clinical

## 2021-01-30 DIAGNOSIS — F331 Major depressive disorder, recurrent, moderate: Secondary | ICD-10-CM | POA: Diagnosis not present

## 2021-01-30 NOTE — Progress Notes (Signed)
Virtual Visit via Video Note  I connected withBonita W Gill 2/15/22at 3:00 PM ESTby a video enabled telemedicine application and verified that I am speaking with the correct person using two identifiers.  Location: Patient:Home Provider:Office  I discussed the limitations of evaluation and management by telemedicine and the availability of in person appointments. The patient expressed understanding and agreed to proceed    THERAPY PROGRESS NOTE  Session Time:3:00PM-3:45PM  Participation Level:Active  Behavioral Response:CasualAlertDepressed  Type of Therapy:Individual Therapy  Treatment Goals addressed:Anger and Coping  Interventions:CBT, Motivational Interviewing, Solution Focused and Strength-based  Summary:Stacey W. Waltersis M61O.o.femalewho presents withDepression.The OPT therapist worked with thepatientfor herongoingOPT treatment. The OPT therapist utilized Motivational Interviewing to assist in creating therapeutic repore. The patient in the session was engaged and work in Science writer about hertriggers and symptoms over the past few weeksincludingdifficulty managing the stress of supporting her partner who has significant health problems.The OPT therapist utilized Cognitive Behavioral Therapy through cognitive restructuring as well as worked with the patient on coping strategies to assist in management ofmood including validating the patients feelings and encouraging ongoing focus on the patients self care.The OPT therapist worked with the patient providing support andpsycho-education.  Suicidal/Homicidal:Nowithout intent/plan  Therapist Response:The OPT therapist worked with the patient for the patients scheduled session. The patient was engaged in hersession and gave feedback in relation to triggers, symptoms, and behavior responses over the pastfewweeks. The OPT therapist worked with  the patient utilizing an in session Cognitive Behavioral Therapy exercise. The patient was responsive in the session and verbalized, "Iam going to take a upcoming trip to [place] for my sisters birthday in March". The patient verbalized her understanding on the need to take a break and focus on her on health.The OPT therapistgave support and worked with the patient on continued work in Cytogeneticist with [place]. The OPT therapist will continue treatment work with the patient in her next scheduled session  Plan: Return again in2/3weeks.  Diagnosis:Axis I:Major Depressive Disorder, Recurrent, Moderate  Axis II:No diagnosis  I discussed the assessment and treatment plan with the patient. The patient was provided an opportunity to ask questions and all were answered. The patient agreed with the plan and demonstrated an understanding of the instructions.  The patient was advised to call back or seek an in-person evaluation if the symptoms worsen or if the condition fails to improve as anticipated.  I provided58minutes of non-face-to-face time during this encounter.  [name], LCSW

## 2021-02-05 ENCOUNTER — Encounter: Payer: Self-pay | Admitting: Neurology

## 2021-02-05 ENCOUNTER — Institutional Professional Consult (permissible substitution): Payer: Medicare Other | Admitting: Neurology

## 2021-02-05 ENCOUNTER — Ambulatory Visit (INDEPENDENT_AMBULATORY_CARE_PROVIDER_SITE_OTHER): Payer: Medicare Other | Admitting: Neurology

## 2021-02-05 ENCOUNTER — Other Ambulatory Visit: Payer: Self-pay

## 2021-02-05 VITALS — BP 110/77 | HR 69 | Ht 63.0 in | Wt 208.0 lb

## 2021-02-05 DIAGNOSIS — D508 Other iron deficiency anemias: Secondary | ICD-10-CM | POA: Diagnosis not present

## 2021-02-05 DIAGNOSIS — E6609 Other obesity due to excess calories: Secondary | ICD-10-CM

## 2021-02-05 DIAGNOSIS — R0683 Snoring: Secondary | ICD-10-CM | POA: Diagnosis not present

## 2021-02-05 DIAGNOSIS — R519 Headache, unspecified: Secondary | ICD-10-CM

## 2021-02-05 DIAGNOSIS — F5104 Psychophysiologic insomnia: Secondary | ICD-10-CM

## 2021-02-05 DIAGNOSIS — Z6836 Body mass index (BMI) 36.0-36.9, adult: Secondary | ICD-10-CM

## 2021-02-05 NOTE — Patient Instructions (Signed)
Please remember to try to maintain good sleep hygiene, which means: Keep a regular sleep and wake schedule, try not to exercise or have a meal within 2 hours of your bedtime, try to keep your bedroom conducive for sleep, that is, cool and dark, without light distractors such as an illuminated alarm clock, and refrain from watching TV right before sleep or in the middle of the night and do not keep the TV or radio on during the night. Also, try not to use or play on electronic devices at bedtime, such as your cell phone, tablet PC or laptop. If you like to read at bedtime on an electronic device, try to dim the background light as much as possible. Do not eat in the middle of the night.   We will request a sleep study. We will look for leg twitching and snoring or sleep apnea.   For chronic insomnia, you are best followed by a psychiatrist and/or sleep psychologist.   We will call you with the sleep study results and make a follow up appointment if needed.    Insomnia Insomnia is a sleep disorder that makes it difficult to fall asleep or stay asleep. Insomnia can cause fatigue, low energy, difficulty concentrating, mood swings, and poor performance at work or school. There are three different ways to classify insomnia:  Difficulty falling asleep.  Difficulty staying asleep.  Waking up too early in the morning. Any type of insomnia can be long-term (chronic) or short-term (acute). Both are common. Short-term insomnia usually lasts for three months or less. Chronic insomnia occurs at least three times a week for longer than three months. What are the causes? Insomnia may be caused by another condition, situation, or substance, such as:  Anxiety.  Certain medicines.  Gastroesophageal reflux disease (GERD) or other gastrointestinal conditions.  Asthma or other breathing conditions.  Restless legs syndrome, sleep apnea, or other sleep disorders.  Chronic  pain.  Menopause.  Stroke.  Abuse of alcohol, tobacco, or illegal drugs.  Mental health conditions, such as depression.  Caffeine.  Neurological disorders, such as Alzheimer's disease.  An overactive thyroid (hyperthyroidism). Sometimes, the cause of insomnia may not be known. What increases the risk? Risk factors for insomnia include:  Gender. Women are affected more often than men.  Age. Insomnia is more common as you get older.  Stress.  Lack of exercise.  Irregular work schedule or working night shifts.  Traveling between different time zones.  Certain medical and mental health conditions. What are the signs or symptoms? If you have insomnia, the main symptom is having trouble falling asleep or having trouble staying asleep. This may lead to other symptoms, such as:  Feeling fatigued or having low energy.  Feeling nervous about going to sleep.  Not feeling rested in the morning.  Having trouble concentrating.  Feeling irritable, anxious, or depressed. How is this diagnosed? This condition may be diagnosed based on:  Your symptoms and medical history. Your health care provider may ask about: ? Your sleep habits. ? Any medical conditions you have. ? Your mental health.  A physical exam. How is this treated? Treatment for insomnia depends on the cause. Treatment may focus on treating an underlying condition that is causing insomnia. Treatment may also include:  Medicines to help you sleep.  Counseling or therapy.  Lifestyle adjustments to help you sleep better. Follow these instructions at home: Eating and drinking  Limit or avoid alcohol, caffeinated beverages, and cigarettes, especially close to bedtime. These  can disrupt your sleep.  Do not eat a large meal or eat spicy foods right before bedtime. This can lead to digestive discomfort that can make it hard for you to sleep.   Sleep habits  Keep a sleep diary to help you and your health care  provider figure out what could be causing your insomnia. Write down: ? When you sleep. ? When you wake up during the night. ? How well you sleep. ? How rested you feel the next day. ? Any side effects of medicines you are taking. ? What you eat and drink.  Make your bedroom a dark, comfortable place where it is easy to fall asleep. ? Put up shades or blackout curtains to block light from outside. ? Use a white noise machine to block noise. ? Keep the temperature cool.  Limit screen use before bedtime. This includes: ? Watching TV. ? Using your smartphone, tablet, or computer.  Stick to a routine that includes going to bed and waking up at the same times every day and night. This can help you fall asleep faster. Consider making a quiet activity, such as reading, part of your nighttime routine.  Try to avoid taking naps during the day so that you sleep better at night.  Get out of bed if you are still awake after 15 minutes of trying to sleep. Keep the lights down, but try reading or doing a quiet activity. When you feel sleepy, go back to bed.   General instructions  Take over-the-counter and prescription medicines only as told by your health care provider.  Exercise regularly, as told by your health care provider. Avoid exercise starting several hours before bedtime.  Use relaxation techniques to manage stress. Ask your health care provider to suggest some techniques that may work well for you. These may include: ? Breathing exercises. ? Routines to release muscle tension. ? Visualizing peaceful scenes.  Make sure that you drive carefully. Avoid driving if you feel very sleepy.  Keep all follow-up visits as told by your health care provider. This is important. Contact a health care provider if:  You are tired throughout the day.  You have trouble in your daily routine due to sleepiness.  You continue to have sleep problems, or your sleep problems get worse. Get help right  away if:  You have serious thoughts about hurting yourself or someone else. If you ever feel like you may hurt yourself or others, or have thoughts about taking your own life, get help right away. You can go to your nearest emergency department or call:  Your local emergency services (911 in the [place]  A suicide crisis helpline, such as the [place] at 517-213-1282. This is open 24 hours a day. Summary  Insomnia is a sleep disorder that makes it difficult to fall asleep or stay asleep.  Insomnia can be long-term (chronic) or short-term (acute).  Treatment for insomnia depends on the cause. Treatment may focus on treating an underlying condition that is causing insomnia.  Keep a sleep diary to help you and your health care provider figure out what could be causing your insomnia. This information is not intended to replace advice given to you by your health care provider. Make sure you discuss any questions you have with your health care provider. Document Revised: 10/12/2020 Document Reviewed: 10/12/2020 Elsevier Patient Education  2021 [employer].

## 2021-02-05 NOTE — Progress Notes (Signed)
SLEEP MEDICINE CLINIC    Provider:  Larey Seat, MD  Primary Care Physician:  Neale Burly, MD [place] [address]     Referring Provider: Norman Clay, MD         Chief Complaint according to patient   Patient presents with:    . New Patient (Initial Visit)     Based on virtual visit - telephone conversation- psychiatry referral.       HISTORY OF PRESENT ILLNESS:  [name] is a 60 - year old African -American female patient ans is seen upon a referral for a sleep consultation- by her psychiatrist, Dr. Modesta Messing,  on 02/05/2021 for an evalutaion of possible organic contributors to her insomnia disorder.   Chief concern according to patient : Pt is here alone, in rm 10. Pt presents today having nights where she is unable to sleep well , trouble to initiate sleep and often waking up as early as 2 AM- mind is racing- t. Indicated that she feels that it is mostly related to stress/anxiety of things going on-keeps her mind racing making difficulty to fall asleep.  Had a SS several yrs ago (over 5 yrs) does not recall OSA being a concern at the time.  She avg 5-6 hrs of fragmented sleep -" broken" .  [name]  has a past medical history of Anxiety, Asthma, Chronic back pain, Depression, Diverticulitis, GERD (gastroesophageal reflux disease), Hemorrhoids, Hiatal hernia, HTN (hypertension), Hypothyroidism, IBS (irritable bowel syndrome), Migraine, Neck pain, chronic, Obesity, Polyp of colon, hyperplastic, 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  The patient had the first sleep study in the year 2016 in [place], and one in [place]. She is unsure where and by whom. She has no record of any  sleep study in [facility].    Sleep relevant medical history: no Nocturia/,no Sleep walking, no  Night terrors, , no  Tonsillectomy, neck injuries, DDD, and had sinus surgery. Chronic allergic rhinitis.    Family medical /sleep history: no  other family member on CPAP with OSA, insomnia, sleep walkers.    Social history: he is married and the main caretaker of her severely ill husband. 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 Tobacco use- husband was a smoker- but did smoke in the house- she quit smoking 3 years ago.  ETOH use - none , Caffeine intake in form of Coffee( 1-2) Soda( mountain dew , 1 can a day) drinks.      Sleep habits are as follows: The patient's dinner time is between 5-6PM. The patient goes to bed at 2-3 AM, she feels weird- once asleep , she continues to sleep for 3-6  hours, wakes for 1 bathroom break, and afterwards the mind is racing again. The preferred sleep position is sideways , with the support of 2 pillows. Hiatal hernia with GERD-  Dreams are reportedly frequent/ some are vivid- 3 times a week.    7-8 AM is the usual rise time. The patient wakes  up spontaneously.   She reports not feeling refreshed or restored in AM, with symptoms such as dry mouth, morning headaches are frequent , and residual fatigue.she feels anxious all day- and more if she is not getting outside .   Naps are taken infrequently, lasting from 60-90  minutes and are more refreshing than nocturnal sleep but hamper her night time sleep.  There have never been medication used to treat specifically insomnia- She does take Seroquel and Neurontin.  Not even melatonin or valerian.    Review of Systems: Out of a complete 14 system review, the patient complains of only the following symptoms, and all  other reviewed systems are negative.:  Fatigue, sleepiness , snoring, fragmented sleep, Insomnia - racing mind, anxiety- inner unrest, fear, fear of losing her husband, feeling guilty that he is so sick, feeling guilty when she leaves the home.   How likely are you to doze in the following situations: 0 = not likely, 1 = slight chance, 2 = moderate chance, 3 = high chance   Sitting and Reading? Watching Television? Sitting inactive in a public place (theater or meeting)? As a passenger in a car for an hour without a break? Lying down in the afternoon when circumstances permit? Sitting and talking to someone? Sitting quietly after lunch without alcohol? In a car, while stopped for a few minutes in traffic?   Total = 10/ 24 points   FSS endorsed at 32/ 63 points.   Social History   Socioeconomic History  . Marital status: Married    Spouse name: Not on file  . Number of children: 2  . Years of education: Not on file  . Highest education level: Not on file  Occupational History    Employer: UNEMPLOYED  Tobacco Use  . Smoking status: Former Smoker    Packs/day: 0.50    Years: 10.00    Pack years: 5.00    Types: Cigarettes    Quit date: 12/15/2006    Years since quitting: 14.1  . Smokeless tobacco: Never Used  Vaping Use  . Vaping Use: Never used  Substance and Sexual Activity  . Alcohol use: Not Currently    Alcohol/week: 0.0 standard drinks  . Drug use: No  . Sexual activity: Yes    Birth control/protection: Surgical  Other Topics Concern  . Not on file  Social History Narrative  . Not on file   Social Determinants of Health   Financial Resource Strain: Not on file  Food Insecurity: No Food Insecurity  . Worried About Charity fundraiser in the Last Year: Never true  . Ran Out of Food in the Last Year: Never true  Transportation Needs: No Transportation Needs  . Lack of Transportation (Medical): No  . Lack of Transportation (Non-Medical): No  Physical Activity:  Not on file  Stress: Not on file  Social Connections: Not on file    Family History  Problem Relation Age of Onset  . ALS Father   . Crohn's disease Daughter   . Diabetes Mother   . Rheum arthritis Mother     Past Medical History:  Diagnosis Date  . Anxiety   . Asthma   . Chronic back pain   . Depression   . Diverticulitis    complicated by abscess requiring total colectomy and take-down of colostomy in 2004  . GERD (gastroesophageal reflux disease)   . Hemorrhoids   . Hiatal hernia   . HTN (hypertension)   . Hypothyroidism   .  IBS (irritable bowel syndrome)   . Migraine   . Neck pain, chronic   . Obesity   . Polyp of colon, hyperplastic   . [facility] spotted fever   . Terminal ileitis of small intestine ([place])    referred to [facility], Crohn's ruled out  . Thyroid disease     Past Surgical History:  Procedure Laterality Date  . APPENDECTOMY    . CHOLECYSTECTOMY  2004   at time of colectomy  . COLONOSCOPY  03/2009   anastomotic ulcers and neoterminal ileum ulcers, anastomosis with SB at 30cm. bx s/o Crohn's  . COLONOSCOPY  2005   Dr. Irving Shows: rectal ulcers  . COLONOSCOPY  2011   [facility]  . COLONOSCOPY N/A 01/30/2016   Dr.Rourk- internal hemorrhoids, likely source of hematochezia, rectal polyps removed s/p subtotal colectomy with normal appearing residual colonic and small bowel mucosa bx= hyperplasticpolyp  . ESOPHAGOGASTRODUODENOSCOPY  02/2009   mild erosive reflux esophagitis  . ESOPHAGOGASTRODUODENOSCOPY  07/2010   probable cervical esophageal web s/p dilation  . ESOPHAGOGASTRODUODENOSCOPY N/A 01/30/2016   Dr.Rourk- normal apearing patent tubular esophagus, hiatal hernia  . ethmoid mucocele, turbinate surgery  2002   right  . GIVENS CAPSULE STUDY  11/2009   Dr. Gala Romney: subtle erytherma and edema of TI  . HEMORRHOID BANDING  2017   Dr.Rourk  . hysterectomy  2009   partial  . LIVER BIOPSY  2004   steatosis, at time of colectomy  . SUBTOTAL COLECTOMY   2004   diverticulitis with abscess  . UMBILICAL HERNIA REPAIR       Current Outpatient Medications on File Prior to Visit  Medication Sig Dispense Refill  . buPROPion (WELLBUTRIN XL) 300 MG 24 hr tablet Take 1 tablet (300 mg total) by mouth daily. 30 tablet 1  . Calcium Carb-Cholecalciferol (CALCIUM 600+D3 PO) Take 1 tablet by mouth daily.    Marland Kitchen dicyclomine (BENTYL) 10 MG capsule Take 10 mg by mouth 4 (four) times daily -  before meals and at bedtime.    . gabapentin (NEURONTIN) 300 MG capsule Take 300 mg by mouth 2 (two) times daily.    Marland Kitchen levothyroxine (SYNTHROID, LEVOTHROID) 25 MCG tablet Take 25 mcg by mouth daily.    . metFORMIN (GLUCOPHAGE) 500 MG tablet Take 500 mg by mouth daily.    . Omega-3 Fatty Acids (FISH OIL PO) Take 2,000 mg by mouth in the morning and at bedtime.    Marland Kitchen omeprazole (PRILOSEC) 40 MG capsule Take 40 mg by mouth daily.    . QUEtiapine (SEROQUEL) 50 MG tablet Take 50 mg by mouth at bedtime.    . simvastatin (ZOCOR) 10 MG tablet Take 10 mg by mouth daily.    Marland Kitchen spironolactone (ALDACTONE) 50 MG tablet Take 50 mg by mouth 2 (two) times daily.     No current facility-administered medications on file prior to visit.    Allergies  Allergen Reactions  . Hydromorphone Hcl Itching    REACTION: Dilaudid    Physical exam:  Today's Vitals   02/05/21 1054  BP: 110/77  Pulse: 69  Weight: 208 lb (94.3 kg)  Height: 5\' 3"  (1.6 m)   Body mass index is 36.85 kg/m.   Wt Readings from Last 3 Encounters:  02/05/21 208 lb (94.3 kg)  07/01/20 190 lb (86.2 kg)  06/22/19 188 lb 11.4 oz (85.6 kg)     Ht Readings from Last 3 Encounters:  02/05/21 5\' 3"  (1.6 m)  07/01/20 5\' 3"  (1.6 m)  06/22/19  5\' 3"  (1.6 m)      General: The patient is awake, alert and appears not in acute distress.  The patient is well groomed. Head: Normocephalic, atraumatic. Neck is supple. Mallampati 3 plus,  Wide and low - no visible uvula, large tongue, pale mucosa.  neck circumference:16. 25   inches . Nasal airflow barely patent.  Retrognathia is not seen.  Dental status: biological teeth.   Cardiovascular:  Regular rate and cardiac rhythm by pulse,  without distended neck veins. Respiratory: Lungs are clear to auscultation.  Skin:  Without evidence of ankle edema, or rash. Trunk: The patient's posture is erect.   Neurologic exam : The patient is awake and alert, oriented to place and time.   Memory subjective described as intact.  Attention span & concentration ability appears normal.  Speech is fluent, without  dysarthria, dysphonia or aphasia.  Mood and affect are slightly anxious, agitated, worried.    Cranial nerves: no loss of smell or taste reported  Pupils are equal and briskly reactive to light. Funduscopic exam deferred. .  Extraocular movements in vertical and horizontal planes were intact and without nystagmus.  No Diplopia. Visual fields by finger perimetry are intact. Hearing was impaired to soft voice and finger rubbing.   Facial sensation intact to fine touch. Facial motor strength is symmetric and tongue in midline.  Neck ROM : rotation, tilt and flexion extension were normal for age and shoulder shrug was symmetrical.    Motor exam:  Symmetric bulk, tone and ROM.  Normal tone without cog wheeling, symmetric grip strength . Sensory:  Fine touch and vibration were  Intact in upper and lower extremities-   Proprioception tested in the upper extremities was normal. Coordination: Rapid alternating movements in the fingers/hands were of normal speed.  The Finger-to-nose maneuver was intact without evidence of ataxia, dysmetria or tremor.  Gait and station: Patient could rise unassisted from a seated position, walked without assistive device.  Stance is of normal width/ base .  Toe and heel walk were deferred.  Deep tendon reflexes: in the  upper and lower extremities are symmetric and intact.  Babinski response was deferred.    After spending a total time  of 45 minutes face to face and additional time for physical and neurologic examination, review of laboratory studies,  personal review of imaging studies, reports and results of other testing and review of referral information / records as far as provided in visit, I have established the following assessments:  There is a long history of feeling anxious and overwhelmed with her sick husband care. Her husband is illiterate and can't use a computer, is in all activities of daily living very dependent on her.  She has underlying feelings of guilt about needing her own breaks and time away from the mutual home, has now her own bedroom, but is constantly requested to attend to him.    1)  There are risk factors for  OSA but these do not influence her inability to intitiate sleep in the first place, they make contribute to the sleep arousals.   2) morbid obesity BMI 36.8 kg/m2 , invisible uvula, large neck size  - and herself a former smoker plus exposed to second hand smoke.   3) pallor - may be anemic , too.   4) insomnia most likely related to psychiatric factors and she sleeps only when Seroquel and Neurontin are on board. She is already well establsihed in [facility].  5) She has pain in  her hands and wrists- may be related to Neck DDD.    My Plan is to proceed with:  1) screening for sleep apnea - suspect OSA-  I  would prefer for her to undergo an attended sleep study to be able to look at palpitations, may be irregular heart rates. No RLS.  GERD is improved with elevated head of bed.     I would like to thank Norman Clay , MD  and for allowing me to meet with and to take care of this pleasant patient.   I plan to follow up either personally or through our NP within 4-6  month.    Electronically signed by: Larey Seat, MD 02/05/2021 11:21 AM  [facility] Board certified by The [employer] of Sleep Medicine and Diplomate of the [employer] of  Sleep Medicine. Board certified In Neurology through the [employer], Fellow of the [employer] of Neurology. Medical Director of [facility].

## 2021-02-12 ENCOUNTER — Other Ambulatory Visit: Payer: Self-pay | Admitting: *Deleted

## 2021-02-12 NOTE — Progress Notes (Deleted)
[place] MD/PA/NP OP Progress Note  02/12/2021 10:56 AM [name]  MRN:  [id number]  Chief Complaint:  HPI: *** Visit Diagnosis: No diagnosis found.  Past Psychiatric History: Please see initial evaluation for full details. I have reviewed the history. No updates at this time.     Past Medical History:  Past Medical History:  Diagnosis Date  . Anxiety   . Asthma   . Chronic back pain   . Depression   . Diverticulitis    complicated by abscess requiring total colectomy and take-down of colostomy in 2004  . GERD (gastroesophageal reflux disease)   . Hemorrhoids   . Hiatal hernia   . HTN (hypertension)   . Hypothyroidism   . IBS (irritable bowel syndrome)   . Migraine   . Neck pain, chronic   . Obesity   . Polyp of colon, hyperplastic   . [facility] spotted fever   . Terminal ileitis of small intestine ([place])    referred to [facility], Crohn's ruled out  . Thyroid disease     Past Surgical History:  Procedure Laterality Date  . APPENDECTOMY    . CHOLECYSTECTOMY  2004   at time of colectomy  . COLONOSCOPY  03/2009   anastomotic ulcers and neoterminal ileum ulcers, anastomosis with SB at 30cm. bx s/o Crohn's  . COLONOSCOPY  2005   Dr. Irving Shows: rectal ulcers  . COLONOSCOPY  2011   [facility]  . COLONOSCOPY N/A 01/30/2016   Dr.Rourk- internal hemorrhoids, likely source of hematochezia, rectal polyps removed s/p subtotal colectomy with normal appearing residual colonic and small bowel mucosa bx= hyperplasticpolyp  . ESOPHAGOGASTRODUODENOSCOPY  02/2009   mild erosive reflux esophagitis  . ESOPHAGOGASTRODUODENOSCOPY  07/2010   probable cervical esophageal web s/p dilation  . ESOPHAGOGASTRODUODENOSCOPY N/A 01/30/2016   Dr.Rourk- normal apearing patent tubular esophagus, hiatal hernia  . ethmoid mucocele, turbinate surgery  2002   right  . GIVENS CAPSULE STUDY  11/2009   Dr. Gala Romney: subtle erytherma and edema of TI  . HEMORRHOID BANDING  2017   Dr.Rourk  . hysterectomy   2009   partial  . LIVER BIOPSY  2004   steatosis, at time of colectomy  . SUBTOTAL COLECTOMY  2004   diverticulitis with abscess  . UMBILICAL HERNIA REPAIR      Family Psychiatric History: Please see initial evaluation for full details. I have reviewed the history. No updates at this time.     Family History:  Family History  Problem Relation Age of Onset  . ALS Father   . Crohn's disease Daughter   . Diabetes Mother   . Rheum arthritis Mother     Social History:  Social History   Socioeconomic History  . Marital status: Married    Spouse name: Not on file  . Number of children: 2  . Years of education: Not on file  . Highest education level: Not on file  Occupational History    Employer: UNEMPLOYED  Tobacco Use  . Smoking status: Former Smoker    Packs/day: 0.50    Years: 10.00    Pack years: 5.00    Types: Cigarettes    Quit date: 12/15/2006    Years since quitting: 14.1  . Smokeless tobacco: Never Used  Vaping Use  . Vaping Use: Never used  Substance and Sexual Activity  . Alcohol use: Not Currently    Alcohol/week: 0.0 standard drinks  . Drug use: No  . Sexual activity: Yes    Birth control/protection:  Surgical  Other Topics Concern  . Not on file  Social History Narrative  . Not on file   Social Determinants of Health   Financial Resource Strain: Not on file  Food Insecurity: No Food Insecurity  . Worried About Charity fundraiser in the Last Year: Never true  . Ran Out of Food in the Last Year: Never true  Transportation Needs: No Transportation Needs  . Lack of Transportation (Medical): No  . Lack of Transportation (Non-Medical): No  Physical Activity: Not on file  Stress: Not on file  Social Connections: Not on file    Allergies:  Allergies  Allergen Reactions  . Hydromorphone Hcl Itching    REACTION: Dilaudid    Metabolic Disorder Labs: Lab Results  Component Value Date   HGBA1C 6.4 (H) 06/03/2011   MPG 137 (H) 06/03/2011   MPG  134 02/22/2009   No results found for: PROLACTIN No results found for: CHOL, TRIG, HDL, CHOLHDL, VLDL, LDLCALC Lab Results  Component Value Date   TSH 0.389 04/09/2011    Therapeutic Level Labs: No results found for: LITHIUM No results found for: VALPROATE No components found for:  CBMZ  Current Medications: Current Outpatient Medications  Medication Sig Dispense Refill  . buPROPion (WELLBUTRIN XL) 300 MG 24 hr tablet Take 1 tablet (300 mg total) by mouth daily. 30 tablet 1  . Calcium Carb-Cholecalciferol (CALCIUM 600+D3 PO) Take 1 tablet by mouth daily.    Marland Kitchen dicyclomine (BENTYL) 10 MG capsule Take 10 mg by mouth 4 (four) times daily -  before meals and at bedtime.    . gabapentin (NEURONTIN) 300 MG capsule Take 300 mg by mouth 2 (two) times daily.    Marland Kitchen levothyroxine (SYNTHROID, LEVOTHROID) 25 MCG tablet Take 25 mcg by mouth daily.    . metFORMIN (GLUCOPHAGE) 500 MG tablet Take 500 mg by mouth daily.    . Omega-3 Fatty Acids (FISH OIL PO) Take 2,000 mg by mouth in the morning and at bedtime.    Marland Kitchen omeprazole (PRILOSEC) 40 MG capsule Take 40 mg by mouth daily.    . QUEtiapine (SEROQUEL) 50 MG tablet Take 50 mg by mouth at bedtime.    . simvastatin (ZOCOR) 10 MG tablet Take 10 mg by mouth daily.    Marland Kitchen spironolactone (ALDACTONE) 50 MG tablet Take 50 mg by mouth 2 (two) times daily.     No current facility-administered medications for this visit.     Musculoskeletal: Strength & Muscle Tone: N/A Gait & Station: N/A Patient leans: N/A  Psychiatric Specialty Exam: Review of Systems  There were no vitals taken for this visit.There is no height or weight on file to calculate BMI.  General Appearance: {Appearance:22683}  Eye Contact:  {BHH EYE CONTACT:22684}  Speech:  Clear and Coherent  Volume:  Normal  Mood:  {BHH MOOD:22306}  Affect:  {Affect (PAA):22687}  Thought Process:  Coherent  Orientation:  Full (Time, Place, and Person)  Thought Content: Logical   Suicidal Thoughts:   {ST/HT (PAA):22692}  Homicidal Thoughts:  {ST/HT (PAA):22692}  Memory:  Immediate;   Good  Judgement:  {Judgement (PAA):22694}  Insight:  {Insight (PAA):22695}  Psychomotor Activity:  Normal  Concentration:  Concentration: Good and Attention Span: Good  Recall:  Good  Fund of Knowledge: Good  Language: Good  Akathisia:  No  Handed:  Right  AIMS (if indicated): not done  Assets:  Communication Skills Desire for Improvement  ADL's:  Intact  Cognition: WNL  Sleep:  {BHH GOOD/FAIR/POOR:22877}  Screenings: PHQ2-9   Flowsheet Row Patient Outreach Telephone from 06/22/2020 in [facility]  PHQ-2 Total Score 2  PHQ-9 Total Score 4       Assessment and Plan:  [name] is a 60 y.o. year old female with a history of depression, anxiety, diabetes , who presents for follow up appointment for below.   1. MDD (major depressive disorder), recurrent episode, mild (HCC) There has been overall improvement in depressive symptoms, which coincided with up titration of bupropion, and her starting self-care.  We will continue current dose of bupropion to target depression.  She has no known history of seizure.  We will continue quetiapine at this time as adjunctive treatment for depression.  She is aware of its risk of metabolic side effect and EPS.  She will continue to see Mr. Eulas Post for therapy.   2. Insomnia, unspecified type She has history of snoring,and has insomnia and fatigue.Made referral for sleep evaluation.  Plan 1. Continuebupropion300 mg daily 2.Next appointment: 3/14 at 11 AM for 30 mins, video 3. Referred for sleep evaluation - on quetiapine 50 mg qhs, gabapentin 300 mg BID, prescribed by PCP - TSH is to be checked by her PCP in April   - She may be on imipramine according to Epic for unknown indication. Will obtain medication list from the pharmacy.   The patient demonstrates the following risk factors for suicide:  Chronic risk factors for suicide include:psychiatric disorder ofdepression. Acute risk factorsfor suicide include: family or marital conflict. Protective factorsfor this patient include: positive social support, coping skills and hope for the future. Considering these factors, the overall suicide risk at this point appears to below. Patientisappropriate for outpatient follow up.  Norman Clay, MD 02/12/2021, 10:56 AM

## 2021-02-12 NOTE — Patient Outreach (Signed)
[facility]) Care Management  02/12/2021  [name] 18-Aug-1961 [id number]   [name] has made 5 outreach attempts. RN has sent out unsuccessful outreach letter with no response back. RN will close case at this time.  Plan: Case closure RN sent case closure letter to patient RN sent case closure letter to PCP  [place] Management [phone]

## 2021-02-19 ENCOUNTER — Other Ambulatory Visit: Payer: Self-pay | Admitting: *Deleted

## 2021-02-19 NOTE — Patient Outreach (Signed)
[facility]) Care Management  02/19/2021  [name] 1961-05-19 [id number]   [place] received telephone call from patient.  [name] is calling to try to get a life alert for husband. RN called [facility] life alert and patient plan does not qualify for a free life alert from [facility]. RN discussed calling [employer] and checking to update plan to include the Medical alert. RN discussed also about checking into palliative care for husband. Patient has requested that her closed status be reopened.   Plan: RN will reopen disease management  [place] Management [phone]

## 2021-02-21 NOTE — Patient Instructions (Signed)
Goals Addressed            This Visit's Progress   . (THN)Monitor and Manage My Blood Sugar-Diabetes Type 2       Timeframe:  Long-Range Goal Priority:  High Start Date:   [id number]                          Expected End Date:  56213086                     Follow Up Date [id number]   - check blood sugar at prescribed times - check blood sugar if I feel it is too high or too low - take the blood sugar log to all doctor visits - take the blood sugar meter to all doctor visits    Why is this important?    Checking your blood sugar at home helps to keep it from getting very high or very low.   Writing the results in a diary or log helps the doctor know how to care for you.   Your blood sugar log should have the time, date and the results.   Also, write down the amount of insulin or other medicine that you take.   Other information, like what you ate, exercise done and how you were feeling, will also be helpful.     Notes:     . [facility] Eye Exam-Diabetes Type 2       Timeframe:  Long-Range Goal Priority:  Medium Start Date:    95284132                         Expected End Date:   44010272                   Follow Up Date 53664403   - schedule appointment with eye doctor    Why is this important?    Eye check-ups are important when you have diabetes.   Vision loss can be prevented.    Notes:     . (THN)Perform Foot Care-Diabetes Type 2       Timeframe:  Long-Range Goal Priority:  Medium Start Date:      47425956                       Expected End Date:     38756433                  Follow Up Date 29518841  - check feet daily for cuts, sores or redness - trim toenails straight across - wash and dry feet carefully every day - wear comfortable, cotton socks - wear comfortable, well-fitting shoes    Why is this important?    Good foot care is very important when you have diabetes.   There are many things you can do to keep your feet healthy and catch a problem  early.    Notes:     . (THN)Set My Target A1C-Diabetes Type 2       Timeframe:  Long-Range Goal Priority:  High Start Date:    66063016                         Expected End Date:    01093235                   Follow Up Date  75198242   - set target A1C    Why is this important?    Your target A1C is decided together by you and your doctor.   It is based on several things like your age and other health issues.    Notes:  A1C 6.5

## 2021-02-21 NOTE — Patient Outreach (Signed)
[facility]) Care Management  [place]  02/21/2021   [name] 06-09-1961 [id number]  [place] telephone call to patient.  [employer] compliance verified. Per patient she has not been checking her blood sugars. Patient A1C is 6.5. She has been under stress with the care of her husband with chronic illness. RN discussed with patient about monitoring her blood sugar. Patient has agreed to outreach calls.   Encounter Medications:  Outpatient Encounter Medications as of 02/19/2021  Medication Sig Note  . buPROPion (WELLBUTRIN XL) 300 MG 24 hr tablet Take 1 tablet (300 mg total) by mouth daily.   . Calcium Carb-Cholecalciferol (CALCIUM 600+D3 PO) Take 1 tablet by mouth daily.   Marland Kitchen dicyclomine (BENTYL) 10 MG capsule Take 10 mg by mouth 4 (four) times daily -  before meals and at bedtime.   . gabapentin (NEURONTIN) 300 MG capsule Take 300 mg by mouth 2 (two) times daily.   Marland Kitchen levothyroxine (SYNTHROID, LEVOTHROID) 25 MCG tablet Take 25 mcg by mouth daily. 07/30/2016: Received from: External Pharmacy  . metFORMIN (GLUCOPHAGE) 500 MG tablet Take 500 mg by mouth daily.   . Omega-3 Fatty Acids (FISH OIL PO) Take 2,000 mg by mouth in the morning and at bedtime.   Marland Kitchen omeprazole (PRILOSEC) 40 MG capsule Take 40 mg by mouth daily.   . QUEtiapine (SEROQUEL) 50 MG tablet Take 50 mg by mouth at bedtime.   . simvastatin (ZOCOR) 10 MG tablet Take 10 mg by mouth daily. 07/30/2016: Received from: External Pharmacy  . spironolactone (ALDACTONE) 50 MG tablet Take 50 mg by mouth 2 (two) times daily.    No facility-administered encounter medications on file as of 02/19/2021.    Functional Status:  No flowsheet data found.  Fall/Depression Screening: No flowsheet data found. PHQ 2/9 Scores 06/22/2020  PHQ - 2 Score 2  PHQ- 9 Score 4    Assessment:  Goals Addressed            This Visit's Progress   . (THN)Monitor and Manage My Blood Sugar-Diabetes Type 2       Timeframe:   Long-Range Goal Priority:  High Start Date:   55732202                          Expected End Date:  54270623                     Follow Up Date 76283151   - check blood sugar at prescribed times - check blood sugar if I feel it is too high or too low - take the blood sugar log to all doctor visits - take the blood sugar meter to all doctor visits    Why is this important?    Checking your blood sugar at home helps to keep it from getting very high or very low.   Writing the results in a diary or log helps the doctor know how to care for you.   Your blood sugar log should have the time, date and the results.   Also, write down the amount of insulin or other medicine that you take.   Other information, like what you ate, exercise done and how you were feeling, will also be helpful.     Notes:     . (THN)Obtain Eye Exam-Diabetes Type 2       Timeframe:  Long-Range Goal Priority:  Medium Start Date:    76160737  Expected End Date:   41962229                   Follow Up Date 79892119   - schedule appointment with eye doctor    Why is this important?    Eye check-ups are important when you have diabetes.   Vision loss can be prevented.    Notes:     . (THN)Perform Foot Care-Diabetes Type 2       Timeframe:  Long-Range Goal Priority:  Medium Start Date:      41740814                       Expected End Date:     48185631                  Follow Up Date 49702637  - check feet daily for cuts, sores or redness - trim toenails straight across - wash and dry feet carefully every day - wear comfortable, cotton socks - wear comfortable, well-fitting shoes    Why is this important?    Good foot care is very important when you have diabetes.   There are many things you can do to keep your feet healthy and catch a problem early.    Notes:     . (THN)Set My Target A1C-Diabetes Type 2       Timeframe:  Long-Range Goal Priority:  High Start Date:     85885027                         Expected End Date:    74128786                   Follow Up Date 76720947   - set target A1C    Why is this important?    Your target A1C is decided together by you and your doctor.   It is based on several things like your age and other health issues.    Notes:  A1C 6.5       Plan:  Follow-up:  Patient agrees to Care Plan and Follow-up. Provided calendar book Discussed importance of blood sugar monitoring Provided information on [place] medical alert system Patient will follow up with scheduling eye exam RN will follow up within the month of June RN sent update assessment to PCP  [place] Management [phone]

## 2021-02-26 ENCOUNTER — Other Ambulatory Visit: Payer: Self-pay

## 2021-02-26 ENCOUNTER — Ambulatory Visit (HOSPITAL_COMMUNITY): Payer: Medicare Other | Admitting: Clinical

## 2021-02-26 ENCOUNTER — Telehealth (HOSPITAL_COMMUNITY): Payer: Self-pay | Admitting: Clinical

## 2021-02-26 ENCOUNTER — Telehealth: Payer: Medicare Other | Admitting: Psychiatry

## 2021-02-26 ENCOUNTER — Telehealth: Payer: Self-pay | Admitting: Psychiatry

## 2021-02-26 NOTE — Telephone Encounter (Signed)
Sent link for video visit through Epic. Patient did not sign in. Called the patient  for appointment scheduled today. The patient did not answer the phone. Left voice message to contact the office.  

## 2021-02-26 NOTE — Telephone Encounter (Signed)
The pt did not respond to video link, phone call, or VM

## 2021-03-13 ENCOUNTER — Encounter: Payer: Self-pay | Admitting: Psychiatry

## 2021-03-13 ENCOUNTER — Telehealth (INDEPENDENT_AMBULATORY_CARE_PROVIDER_SITE_OTHER): Payer: Medicare Other | Admitting: Psychiatry

## 2021-03-13 ENCOUNTER — Other Ambulatory Visit: Payer: Self-pay

## 2021-03-13 DIAGNOSIS — F3341 Major depressive disorder, recurrent, in partial remission: Secondary | ICD-10-CM | POA: Diagnosis not present

## 2021-03-13 MED ORDER — BUPROPION HCL ER (XL) 300 MG PO TB24: 300.0000 mg | ORAL_TABLET | Freq: Every day | ORAL | 1 refills | Status: DC

## 2021-03-13 NOTE — Patient Instructions (Signed)
1. Continuebupropion300 mg daily 2. Discontinue quetiapine 2.Next appointment: 5/25 at 2 PM

## 2021-03-13 NOTE — Progress Notes (Signed)
Virtual Visit via Video Note  I connected with [name] on 03/13/21 at  1:00 PM EDT by a video enabled telemedicine application and verified that I am speaking with the correct person using two identifiers.  Location: Patient: home Provider: office Persons participated in the visit- patient, provider   I discussed the limitations of evaluation and management by telemedicine and the availability of in person appointments. The patient expressed understanding and agreed to proceed.   I discussed the assessment and treatment plan with the patient. The patient was provided an opportunity to ask questions and all were answered. The patient agreed with the plan and demonstrated an understanding of the instructions.   The patient was advised to call back or seek an in-person evaluation if the symptoms worsen or if the condition fails to improve as anticipated.  I provided 18 minutes of non-face-to-face time during this encounter.   Norman Clay, MD    [facility] MD/PA/NP OP Progress Note  03/13/2021 1:27 PM [name]  MRN:  [id number]  Chief Complaint:  Chief Complaint    Follow-up; Depression     HPI:  This is a follow-up appointment for depression.  She states that it was rough.  Her husband was admitted due to exacerbation of COPD.  He is now back to home, and they are trying to get settled.  Although it was very overwhelmed and she was stressed, things have been back to normal for the past week.  She visited her sister in [place] with her other siblings.  It was "awesome "experience. "Although her mood has been the same, she has noticed weight gain.  She has been on quetiapine for many years.  However, she is willing to discontinue this medication at this time due to its potential risk of weight gain.  She agrees to contact her provider to discuss this change as well.  She continues to have insomnia and snoring.  She agrees to contact the sleep clinic to make an appointment.  She  denies feeling depressed.  She denies anhedonia, and she wants to do things.  She denies SI.  She feels comfortable to stay on bupropion.    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 Used to work at [employer] before pandemic Exercise: She stopped going to [employer] due to pandemic. She does not feel safe to go outside by herself. Support:her daughter, church family Household:husband with COPD Marital status:9 years/12 years of relationship before marriage, divorced in Apr 03, 1978 Number of children:2 from her previous marriage (1 son, 1 daughter), 2 from his marriage.  She had "great" relationship with her mother. She used to take care of her mother, who deceased in 10/04/2018 (two hours after her grandmother deceased). Her father deceased in 04/03/02. She has seven siblings  Visit Diagnosis:    ICD-10-CM   1. MDD (major depressive disorder), recurrent, in partial remission ([place])  F33.41     Past Psychiatric History: Please see initial evaluation for full details. I have reviewed the history. No updates at this time.     Past Medical History:  Past Medical History:  Diagnosis Date  . Anxiety   . Asthma   . Chronic back pain   . Depression   . Diverticulitis    complicated by abscess requiring total  colectomy and take-down of colostomy in 2004  . GERD (gastroesophageal reflux disease)   . Hemorrhoids   . Hiatal hernia   . HTN (hypertension)   . Hypothyroidism   . IBS (irritable bowel syndrome)   . Migraine   . Neck pain, chronic   . Obesity   . Polyp of colon, hyperplastic   . [facility] spotted fever   . Terminal ileitis of small intestine ([place])    referred to [facility], Crohn's ruled out  . Thyroid disease     Past Surgical History:  Procedure Laterality Date  . APPENDECTOMY    . CHOLECYSTECTOMY  2004    at time of colectomy  . COLONOSCOPY  03/2009   anastomotic ulcers and neoterminal ileum ulcers, anastomosis with SB at 30cm. bx s/o Crohn's  . COLONOSCOPY  2005   Dr. Irving Shows: rectal ulcers  . COLONOSCOPY  2011   [facility]  . COLONOSCOPY N/A 01/30/2016   Dr.Rourk- internal hemorrhoids, likely source of hematochezia, rectal polyps removed s/p subtotal colectomy with normal appearing residual colonic and small bowel mucosa bx= hyperplasticpolyp  . ESOPHAGOGASTRODUODENOSCOPY  02/2009   mild erosive reflux esophagitis  . ESOPHAGOGASTRODUODENOSCOPY  07/2010   probable cervical esophageal web s/p dilation  . ESOPHAGOGASTRODUODENOSCOPY N/A 01/30/2016   Dr.Rourk- normal apearing patent tubular esophagus, hiatal hernia  . ethmoid mucocele, turbinate surgery  2002   right  . GIVENS CAPSULE STUDY  11/2009   Dr. Gala Romney: subtle erytherma and edema of TI  . HEMORRHOID BANDING  2017   Dr.Rourk  . hysterectomy  2009   partial  . LIVER BIOPSY  2004   steatosis, at time of colectomy  . SUBTOTAL COLECTOMY  2004   diverticulitis with abscess  . UMBILICAL HERNIA REPAIR      Family Psychiatric History: Please see initial evaluation for full details. I have reviewed the history. No updates at this time.     Family History:  Family History  Problem Relation Age of Onset  . ALS Father   . Crohn's disease Daughter   . Diabetes Mother   . Rheum arthritis Mother     Social History:  Social History   Socioeconomic History  . Marital status: Married    Spouse name: Not on file  . Number of children: 2  . Years of education: Not on file  . Highest education level: Not on file  Occupational History    Employer: UNEMPLOYED  Tobacco Use  . Smoking status: Former Smoker    Packs/day: 0.50    Years: 10.00    Pack years: 5.00    Types: Cigarettes    Quit date: 12/15/2006    Years since quitting: 14.2  . Smokeless tobacco: Never Used  Vaping Use  . Vaping Use: Never used  Substance and  Sexual Activity  . Alcohol use: Not Currently    Alcohol/week: 0.0 standard drinks  . Drug use: No  . Sexual activity: Yes    Birth control/protection: Surgical  Other Topics Concern  . Not on file  Social History Narrative  . Not on file   Social Determinants of Health   Financial Resource Strain: Not on file  Food Insecurity: No Food Insecurity  . Worried About Charity fundraiser in the Last Year: Never true  . Ran Out of Food in the Last Year: Never true  Transportation Needs: No Transportation Needs  . Lack of Transportation (Medical): No  . Lack of Transportation (Non-Medical): No  Physical Activity: Not on  file  Stress: Not on file  Social Connections: Not on file    Allergies:  Allergies  Allergen Reactions  . Hydromorphone Hcl Itching    REACTION: Dilaudid    Metabolic Disorder Labs: Lab Results  Component Value Date   HGBA1C 6.4 (H) 06/03/2011   MPG 137 (H) 06/03/2011   MPG 134 02/22/2009   No results found for: PROLACTIN No results found for: CHOL, TRIG, HDL, CHOLHDL, VLDL, LDLCALC Lab Results  Component Value Date   TSH 0.389 04/09/2011    Therapeutic Level Labs: No results found for: LITHIUM No results found for: VALPROATE No components found for:  CBMZ  Current Medications: Current Outpatient Medications  Medication Sig Dispense Refill  . buPROPion (WELLBUTRIN XL) 300 MG 24 hr tablet Take 1 tablet (300 mg total) by mouth daily. 30 tablet 1  . Calcium Carb-Cholecalciferol (CALCIUM 600+D3 PO) Take 1 tablet by mouth daily.    Marland Kitchen dicyclomine (BENTYL) 10 MG capsule Take 10 mg by mouth 4 (four) times daily -  before meals and at bedtime.    . gabapentin (NEURONTIN) 300 MG capsule Take 300 mg by mouth 2 (two) times daily.    Marland Kitchen levothyroxine (SYNTHROID, LEVOTHROID) 25 MCG tablet Take 25 mcg by mouth daily.    . metFORMIN (GLUCOPHAGE) 500 MG tablet Take 500 mg by mouth daily.    . Omega-3 Fatty Acids (FISH OIL PO) Take 2,000 mg by mouth in the morning  and at bedtime.    Marland Kitchen omeprazole (PRILOSEC) 40 MG capsule Take 40 mg by mouth daily.    . QUEtiapine (SEROQUEL) 50 MG tablet Take 50 mg by mouth at bedtime.    . simvastatin (ZOCOR) 10 MG tablet Take 10 mg by mouth daily.    Marland Kitchen spironolactone (ALDACTONE) 50 MG tablet Take 50 mg by mouth 2 (two) times daily.     No current facility-administered medications for this visit.     Musculoskeletal: Strength & Muscle Tone: N/A Gait & Station: N/A Patient leans: N/A  Psychiatric Specialty Exam: Review of Systems  Psychiatric/Behavioral: Negative for agitation, behavioral problems, confusion, decreased concentration, dysphoric mood, hallucinations, self-injury, sleep disturbance and suicidal ideas. The patient is not nervous/anxious and is not hyperactive.   All other systems reviewed and are negative.   There were no vitals taken for this visit.There is no height or weight on file to calculate BMI.  General Appearance: Fairly Groomed  Eye Contact:  Good  Speech:  Clear and Coherent  Volume:  Normal  Mood:  good  Affect:  Appropriate, Congruent and euthymic  Thought Process:  Coherent  Orientation:  Full (Time, Place, and Person)  Thought Content: Logical   Suicidal Thoughts:  No  Homicidal Thoughts:  No  Memory:  Immediate;   Good  Judgement:  Good  Insight:  Good  Psychomotor Activity:  Normal  Concentration:  Concentration: Good and Attention Span: Good  Recall:  Good  Fund of Knowledge: Good  Language: Good  Akathisia:  No  Handed:  Right  AIMS (if indicated): not done  Assets:  Communication Skills Desire for Improvement  ADL's:  Intact  Cognition: WNL  Sleep:  Poor   Screenings: PHQ2-9   Flowsheet Row Video Visit from 03/13/2021 in [facility] Telephone from 06/22/2020 in [facility]  PHQ-2 Total Score 0 2  PHQ-9 Total Score -- 4    Flowsheet Row Video Visit from 03/13/2021 in [facility]  CATEGORY No Risk       Assessment and Plan:  [name] is a 60 y.o. year old female with a history of depression, anxiety, diabetes, who presents for follow up appointment for below.   1. MDD (major depressive disorder), recurrent, in partial remission ([place]) She denies significant mood symptoms despite recent deterioration of her husband's condition since the last visit.  We will continue current dose of bupropion to target depression. She has no known history of seizure .  She is advised to taper off quetiapine given she has had a significant weight gain.  She is also advised to contact her provider about this medication change.  Will consider up titration of bupropion in the future if any worsening in her mood symptoms.   2. Insomnia, unspecified type She has history of snoring,and has insomnia and fatigue.Made referral for sleep evaluation. She was advised to contact the clinic to make an appointment.   Plan 1. Continuebupropion300 mg daily 2. Discontinue quetiapine 2.Next appointment: 5/25 at 2 PM for 30 mins, video 3. Referred for sleep evaluation - on gabapentin 300 mg BID, prescribed by PCP - TSH is to be checked by her PCP in April   - She may be on imipramine according to Epic for unknown indication. Will obtain medication list from the pharmacy.   The patient demonstrates the following risk factors for suicide: Chronic risk factors for suicide include:psychiatric disorder ofdepression. Acute risk factorsfor suicide include: family or marital conflict. Protective factorsfor this patient include: positive social support, coping skills and hope for the future. Considering these factors, the overall suicide risk at this point appears to below. Patientisappropriate for outpatient follow up.    Norman Clay, MD 03/13/2021, 1:27 PM

## 2021-04-03 ENCOUNTER — Telehealth (HOSPITAL_COMMUNITY): Payer: Self-pay | Admitting: Clinical

## 2021-04-03 ENCOUNTER — Ambulatory Visit (INDEPENDENT_AMBULATORY_CARE_PROVIDER_SITE_OTHER): Payer: Medicare Other | Admitting: Clinical

## 2021-04-03 ENCOUNTER — Other Ambulatory Visit: Payer: Self-pay

## 2021-04-03 DIAGNOSIS — E032 Hypothyroidism due to medicaments and other exogenous substances: Secondary | ICD-10-CM | POA: Diagnosis not present

## 2021-04-03 DIAGNOSIS — T24131A Burn of first degree of right lower leg, initial encounter: Secondary | ICD-10-CM | POA: Diagnosis not present

## 2021-04-03 DIAGNOSIS — I10 Essential (primary) hypertension: Secondary | ICD-10-CM | POA: Diagnosis not present

## 2021-04-03 DIAGNOSIS — Z0000 Encounter for general adult medical examination without abnormal findings: Secondary | ICD-10-CM | POA: Diagnosis not present

## 2021-04-03 DIAGNOSIS — M5431 Sciatica, right side: Secondary | ICD-10-CM | POA: Diagnosis not present

## 2021-04-03 DIAGNOSIS — E1143 Type 2 diabetes mellitus with diabetic autonomic (poly)neuropathy: Secondary | ICD-10-CM | POA: Diagnosis not present

## 2021-04-03 DIAGNOSIS — F331 Major depressive disorder, recurrent, moderate: Secondary | ICD-10-CM

## 2021-04-03 DIAGNOSIS — K219 Gastro-esophageal reflux disease without esophagitis: Secondary | ICD-10-CM | POA: Diagnosis not present

## 2021-04-03 NOTE — Progress Notes (Signed)
    Virtual Visit via Telephone Note  I connected with [name] on 04/03/21 at  1:00 PM EDT by telephone and verified that I am speaking with the correct person using two identifiers.  Location: Patient: Home Provider: Office   I discussed the limitations, risks, security and privacy concerns of performing an evaluation and management service by telephone and the availability of in person appointments. I also discussed with the patient that there may be a patient responsible charge related to this service. The patient expressed understanding and agreed to proceed.    THERAPY PROGRESS NOTE  Session Time:1:00PM-1:45PM  Participation Level:Active  Behavioral Response:CasualAlertDepressed  Type of Therapy:Individual Therapy  Treatment Goals addressed:Anger and Coping  Interventions:CBT, Motivational Interviewing, Solution Focused and Strength-based  Summary:Stacey W. Waltersis [id number].[email or web address] presents withDepression.The OPT therapist worked with thepatientfor herongoingOPT treatment. The OPT therapist utilized Motivational Interviewing to assist in creating therapeutic repore. The patient in the session was engaged and work in Science writer about hertriggers and symptoms over the past few weeksincludingdifficulty managing the stress of supporting her partner who has been told his life expectancy at this point is 3 months.The OPT therapist utilized Cognitive Behavioral Therapy through cognitive restructuring as well as worked with the patient on coping strategies to assist in management ofmoodincluding validating the patients feelings and encouraging ongoing focus on the patients self care while ensuring support of end of life services involvement .The OPT therapist worked with the patient providing support andpsycho-education.  Suicidal/Homicidal:Nowithout intent/plan  Therapist Response:The OPT therapist  worked with the patient for the patients scheduled session. The patient was engaged in hersession and gave feedback in relation to triggers, symptoms, and behavior responses over the pastfewweeks. The OPT therapist worked with the patient utilizing an in session Cognitive Behavioral Therapy exercise. The patient was responsive in the session and verbalized, "Ihave palliative care in place to help with nurses who will be coming and involved ".The patient verbalized her understanding on the need to take a break and focus on her on health while moving forward with her partners end of life process.The OPT therapistgave support while normalizing her range of emotions. The OPT therapist will continue treatment work with the patient in her next scheduled session  Plan: Return again in2/3weeks.  Diagnosis:Axis I:Major Depressive Disorder, Recurrent, Moderate  Axis II:No diagnosis  I discussed the assessment and treatment plan with the patient. The patient was provided an opportunity to ask questions and all were answered. The patient agreed with the plan and demonstrated an understanding of the instructions.  The patient was advised to call back or seek an in-person evaluation if the symptoms worsen or if the condition fails to improve as anticipated.  I provided66minutes of non-face-to-face time during this encounter.  [name], LCSW  04/03/2021

## 2021-05-02 ENCOUNTER — Other Ambulatory Visit: Payer: Self-pay

## 2021-05-02 ENCOUNTER — Ambulatory Visit (INDEPENDENT_AMBULATORY_CARE_PROVIDER_SITE_OTHER): Payer: Medicare Other | Admitting: Clinical

## 2021-05-02 DIAGNOSIS — F331 Major depressive disorder, recurrent, moderate: Secondary | ICD-10-CM

## 2021-05-02 NOTE — Progress Notes (Signed)
Virtual Visit via Telephone Note  I connected with [name] on 05/02/21 at 11:00 AM EDT by telephone and verified that I am speaking with the correct person using two identifiers.  Location: Patient: Home Provider: Office   I discussed the limitations, risks, security and privacy concerns of performing an evaluation and management service by telephone and the availability of in person appointments. I also discussed with the patient that there may be a patient responsible charge related to this service. The patient expressed understanding and agreed to proceed.     THERAPY PROGRESS NOTE  Session Time:11:00AM-11:45AM  Participation Level:Active  Behavioral Response:CasualAlertDepressed  Type of Therapy:Individual Therapy  Treatment Goals addressed:Anger and Coping  Interventions:CBT, Motivational Interviewing, Solution Focused and Strength-based  Summary:Stacey W. Waltersis [id number].[email or web address] presents withDepression.The OPT therapist worked with thepatientfor herongoingOPT treatment. The OPT therapist utilized Motivational Interviewing to assist in creating therapeutic repore. The patient in the session was engaged and work in Science writer about hertriggers and symptoms over the past few weeksincludingdealing with the emotional toll of caregiving for her terminal partner. The patient verbalized having a flood of mixed emotions and thoughts.The OPT therapist utilized Cognitive Behavioral Therapy through cognitive restructuring as well as worked with the patient on coping strategies to assist in management ofmoodincludingvalidating the patients feelings and emotions.The OPT therapist worked with the patient providing support andpsycho-education.  Suicidal/Homicidal:Nowithout intent/plan  Therapist Response:The OPT therapist worked with the patient for the patients scheduled session. The patient was engaged in hersession and  gave feedback in relation to triggers, symptoms, and behavior responses over the pastfewweeks. The OPT therapist worked with the patient utilizing an in session Cognitive Behavioral Therapy exercise. The patient was responsive in the session and verbalized, "Ijust have to get adjusted to the involvement now as well of hospice nurses being at the home ".The patient continued to verbalized her understanding on the need to take a break and focus on her on health while moving forward with her partners end of life process and utilizing the hospice team as a resource to take some of her caregiving task off of her.The OPT therapistgave support while normalizing her range of emotions. The OPT therapist will continue treatment work with the patient in her next scheduled session  Plan: Return again in2/3weeks.  Diagnosis:Axis I:Major Depressive Disorder, Recurrent, Moderate  Axis II:No diagnosis  I discussed the assessment and treatment plan with the patient. The patient was provided an opportunity to ask questions and all were answered. The patient agreed with the plan and demonstrated an understanding of the instructions.  The patient was advised to call back or seek an in-person evaluation if the symptoms worsen or if the condition fails to improve as anticipated.  I provided82minutes of non-face-to-face time during this encounter.  [name], LCSW  05/02/2021

## 2021-05-07 NOTE — Progress Notes (Signed)
Virtual Visit via Video Note  I connected with [name] on 05/09/21 at  2:00 PM EDT by a video enabled telemedicine application and verified that I am speaking with the correct person using two identifiers.  Location: Patient: car Provider: office Persons participated in the visit- patient, provider   I discussed the limitations of evaluation and management by telemedicine and the availability of in person appointments. The patient expressed understanding and agreed to proceed.    I discussed the assessment and treatment plan with the patient. The patient was provided an opportunity to ask questions and all were answered. The patient agreed with the plan and demonstrated an understanding of the instructions.   The patient was advised to call back or seek an in-person evaluation if the symptoms worsen or if the condition fails to improve as anticipated.  I provided 15 minutes of non-face-to-face time during this encounter.   Norman Clay, MD    [facility] MD/PA/NP OP Progress Note  05/09/2021 2:30 PM [name]  MRN:  [id number]  Chief Complaint:  Chief Complaint    Follow-up; Depression     HPI:  This is a follow-up appointment for depression.  She states that her husband's condition is going downhill.  He is getting hospice service for the past month.  She was told by the provider that he may have 3 months.  It is hard for her to see him deteriorating.  Although she feels like throwing a towel, she has been trying.  She has initial and middle insomnia.  She feels fatigue.  She has difficulty in concentration.  She denies SI.  She states that she ran out off bupropion for the past 2 weeks as she thought she does not have any refill (although it was ordered).  She agrees to contact the office if any issues with the medication.  She has not discontinued quetiapine as she has started to go to the [facility]; she lost a few pounds since the last visit.   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 Used to work at [employer] before pandemic Exercise: started to [employer] Support:her daughter, church family Household:husband with COPD Marital status:9 years/12 years of relationship before marriage, divorced in [place] Number of children:2 from her previous marriage (1 son, 1 daughter), 2 from his marriage.  She had "great" relationship with her mother. 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 Her father deceased in 04/13/02. She has seven siblings  205 lbs Wt Readings from Last 3 Encounters:  02/05/21 208 lb (94.3 kg)  07/01/20 190 lb (86.2 kg)  06/22/19 188 lb 11.4 oz (85.6 kg)    Visit Diagnosis:    ICD-10-CM   1. MDD (major depressive disorder), recurrent episode, mild ([place])  F33.0     Past Psychiatric History: Please see initial evaluation for full details. I have reviewed the history. No updates at this time.     Past Medical History:  Past Medical History:  Diagnosis Date  . Anxiety   . Asthma   . Chronic back pain   . Depression   . Diverticulitis    complicated by abscess requiring total colectomy and take-down of colostomy in 2003/04/14  . GERD (gastroesophageal reflux disease)   . Hemorrhoids   . Hiatal hernia   . HTN (hypertension)   . Hypothyroidism   . IBS (irritable bowel syndrome)   . Migraine   .  Neck pain, chronic   . Obesity   . Polyp of colon, hyperplastic   . [facility] spotted fever   . Terminal ileitis of small intestine ([place])    referred to [facility], Crohn's ruled out  . Thyroid disease     Past Surgical History:  Procedure Laterality Date  . APPENDECTOMY    . CHOLECYSTECTOMY  2004   at time of colectomy  . COLONOSCOPY  03/2009   anastomotic ulcers and neoterminal ileum ulcers, anastomosis with SB at 30cm. bx s/o Crohn's  . COLONOSCOPY  2005   Dr. Irving Shows: rectal ulcers  . COLONOSCOPY  2011   [facility]  . COLONOSCOPY  N/A 01/30/2016   Dr.Rourk- internal hemorrhoids, likely source of hematochezia, rectal polyps removed s/p subtotal colectomy with normal appearing residual colonic and small bowel mucosa bx= hyperplasticpolyp  . ESOPHAGOGASTRODUODENOSCOPY  02/2009   mild erosive reflux esophagitis  . ESOPHAGOGASTRODUODENOSCOPY  07/2010   probable cervical esophageal web s/p dilation  . ESOPHAGOGASTRODUODENOSCOPY N/A 01/30/2016   Dr.Rourk- normal apearing patent tubular esophagus, hiatal hernia  . ethmoid mucocele, turbinate surgery  2002   right  . GIVENS CAPSULE STUDY  11/2009   Dr. Gala Romney: subtle erytherma and edema of TI  . HEMORRHOID BANDING  2017   Dr.Rourk  . hysterectomy  2009   partial  . LIVER BIOPSY  2004   steatosis, at time of colectomy  . SUBTOTAL COLECTOMY  2004   diverticulitis with abscess  . UMBILICAL HERNIA REPAIR      Family Psychiatric History: Please see initial evaluation for full details. I have reviewed the history. No updates at this time.     Family History:  Family History  Problem Relation Age of Onset  . ALS Father   . Crohn's disease Daughter   . Diabetes Mother   . Rheum arthritis Mother     Social History:  Social History   Socioeconomic History  . Marital status: Married    Spouse name: Not on file  . Number of children: 2  . Years of education: Not on file  . Highest education level: Not on file  Occupational History    Employer: UNEMPLOYED  Tobacco Use  . Smoking status: Former Smoker    Packs/day: 0.50    Years: 10.00    Pack years: 5.00    Types: Cigarettes    Quit date: 12/15/2006    Years since quitting: 14.4  . Smokeless tobacco: Never Used  Vaping Use  . Vaping Use: Never used  Substance and Sexual Activity  . Alcohol use: Not Currently    Alcohol/week: 0.0 standard drinks  . Drug use: No  . Sexual activity: Yes    Birth control/protection: Surgical  Other Topics Concern  . Not on file  Social History Narrative  . Not on file    Social Determinants of Health   Financial Resource Strain: Not on file  Food Insecurity: No Food Insecurity  . Worried About Charity fundraiser in the Last Year: Never true  . Ran Out of Food in the Last Year: Never true  Transportation Needs: No Transportation Needs  . Lack of Transportation (Medical): No  . Lack of Transportation (Non-Medical): No  Physical Activity: Not on file  Stress: Not on file  Social Connections: Not on file    Allergies:  Allergies  Allergen Reactions  . Hydromorphone Hcl Itching    REACTION: Dilaudid    Metabolic Disorder Labs: Lab Results  Component Value Date  HGBA1C 6.4 (H) 06/03/2011   MPG 137 (H) 06/03/2011   MPG 134 02/22/2009   No results found for: PROLACTIN No results found for: CHOL, TRIG, HDL, CHOLHDL, VLDL, LDLCALC Lab Results  Component Value Date   TSH 0.389 04/09/2011    Therapeutic Level Labs: No results found for: LITHIUM No results found for: VALPROATE No components found for:  CBMZ  Current Medications: Current Outpatient Medications  Medication Sig Dispense Refill  . [START ON 05/13/2021] buPROPion (WELLBUTRIN XL) 300 MG 24 hr tablet Take 1 tablet (300 mg total) by mouth daily. 30 tablet 1  . Calcium Carb-Cholecalciferol (CALCIUM 600+D3 PO) Take 1 tablet by mouth daily.    Marland Kitchen dicyclomine (BENTYL) 10 MG capsule Take 10 mg by mouth 4 (four) times daily -  before meals and at bedtime.    . gabapentin (NEURONTIN) 300 MG capsule Take 300 mg by mouth 2 (two) times daily.    Marland Kitchen levothyroxine (SYNTHROID, LEVOTHROID) 25 MCG tablet Take 25 mcg by mouth daily.    . metFORMIN (GLUCOPHAGE) 500 MG tablet Take 500 mg by mouth daily.    . Omega-3 Fatty Acids (FISH OIL PO) Take 2,000 mg by mouth in the morning and at bedtime.    Marland Kitchen omeprazole (PRILOSEC) 40 MG capsule Take 40 mg by mouth daily.    . QUEtiapine (SEROQUEL) 50 MG tablet Take 50 mg by mouth at bedtime.    . simvastatin (ZOCOR) 10 MG tablet Take 10 mg by mouth daily.     Marland Kitchen spironolactone (ALDACTONE) 50 MG tablet Take 50 mg by mouth 2 (two) times daily.     No current facility-administered medications for this visit.     Musculoskeletal: Strength & Muscle Tone: N/A Gait & Station: N/A Patient leans: N/A  Psychiatric Specialty Exam: Review of Systems  Psychiatric/Behavioral: Positive for decreased concentration, dysphoric mood and sleep disturbance. Negative for agitation, behavioral problems, confusion, hallucinations, self-injury and suicidal ideas. The patient is nervous/anxious. The patient is not hyperactive.   All other systems reviewed and are negative.   There were no vitals taken for this visit.There is no height or weight on file to calculate BMI.  General Appearance: Fairly Groomed  Eye Contact:  Good  Speech:  Clear and Coherent  Volume:  Normal  Mood:  hard  Affect:  Appropriate, Congruent and Restricted  Thought Process:  Coherent  Orientation:  Full (Time, Place, and Person)  Thought Content: Logical   Suicidal Thoughts:  No  Homicidal Thoughts:  No  Memory:  Immediate;   Good  Judgement:  Good  Insight:  Good  Psychomotor Activity:  Normal  Concentration:  Concentration: Good and Attention Span: Good  Recall:  Good  Fund of Knowledge: Good  Language: Good  Akathisia:  No  Handed:  Right  AIMS (if indicated): not done  Assets:  Communication Skills Desire for Improvement  ADL's:  Intact  Cognition: WNL  Sleep:  Poor   Screenings: PHQ2-9   Flowsheet Row Video Visit from 03/13/2021 in [place] Patient Outreach Telephone from 06/22/2020 in [place] Coordination  PHQ-2 Total Score 0 2  PHQ-9 Total Score -- 4    Flowsheet Row Video Visit from 03/13/2021 in [place] No Risk       Assessment and Plan:  [name] is a 60 y.o. year old female with a history of depression, anxiety, diabetes, who  presents for follow up appointment for below.  1. MDD (major depressive disorder), recurrent episode, mild ([place]) She reports worsening in depressive symptoms in the context of non adherence to bupropion/deterioration of her husband's physical condition.  We will restart bupropion to target depression.  Noted that although he was advised to discontinue quetiapine given her significant weight gain, she prefers to stay on the medication now that she returned back to the [employer]/routine exercise.  She will greatly benefit from CBT; she will continue to see Mrs. Terry for therapy.    2. Insomnia, unspecified type She has history of snoring,and has insomnia and fatigue.Made referralfor sleep evaluation. She would like to hold the appointment at this time due to conflict in schedule.   Plan 1.Restartbupropion300 mg daily 2.Next appointment:7/20 at 10 AMfor 30 mins, video 3. Referredfor sleep evaluation - on quetiapine 50 mg at night  - on gabapentin 300 mgBID, prescribed by PCP - TSH is to be checked by her PCP in April  - She may be on imipramine according to Epic for unknown indication. Will obtain medication list from the pharmacy.   The patient demonstrates the following risk factors for suicide: Chronic risk factors for suicide include:psychiatric disorder ofdepression. Acute risk factorsfor suicide include: family or marital conflict. Protective factorsfor this patient include: positive social support, coping skills and hope for the future. Considering these factors, the overall suicide risk at this point appears to below. Patientisappropriate for outpatient follow up.  Norman Clay, MD 05/09/2021, 2:30 PM

## 2021-05-09 ENCOUNTER — Telehealth (INDEPENDENT_AMBULATORY_CARE_PROVIDER_SITE_OTHER): Payer: Medicare Other | Admitting: Psychiatry

## 2021-05-09 ENCOUNTER — Other Ambulatory Visit: Payer: Self-pay

## 2021-05-09 ENCOUNTER — Encounter: Payer: Self-pay | Admitting: Psychiatry

## 2021-05-09 DIAGNOSIS — F330 Major depressive disorder, recurrent, mild: Secondary | ICD-10-CM

## 2021-05-09 MED ORDER — BUPROPION HCL ER (XL) 300 MG PO TB24: 300.0000 mg | ORAL_TABLET | Freq: Every day | ORAL | 1 refills | Status: DC

## 2021-05-09 NOTE — Patient Instructions (Signed)
1.Restartbupropion300 mg daily 2.Next appointment:7/20 at 10 AM

## 2021-05-17 ENCOUNTER — Other Ambulatory Visit: Payer: Self-pay | Admitting: *Deleted

## 2021-05-17 NOTE — Patient Outreach (Signed)
[facility]) Care Management  05/17/2021  JAELIN DEVINCENTIS March 04, 1961 [id number]   RN Health Coach telephone call to patient.  Hipaa compliance verified. Per patient she is at work and asked for a callback between 1230 and 1 pm.  [place] Management [phone]

## 2021-05-17 NOTE — Patient Outreach (Signed)
[facility]) Care Management  [place]  05/17/2021   [name] 06/18/1961 [id number]  RN Health Coach telephone call to patient.  Hipaa compliance verified. Per patient she has not checked her blood sugar. Her A1C is 6.8. Patient is monitoring her diet closely. She stated that she is bakes and broils her food. Patient has been going to [facility] qd.  She stated that she is working 2 1/2 hrs a day three times a week. She has been seeing a therapist. Patient is a caregiver. Hospice is coming to see her husband.  Patient has agreed to further outreach calls.   Encounter Medications:  Outpatient Encounter Medications as of 05/17/2021  Medication Sig Note  . buPROPion (WELLBUTRIN XL) 300 MG 24 hr tablet Take 1 tablet (300 mg total) by mouth daily.   . Calcium Carb-Cholecalciferol (CALCIUM 600+D3 PO) Take 1 tablet by mouth daily.   Marland Kitchen dicyclomine (BENTYL) 10 MG capsule Take 10 mg by mouth 4 (four) times daily -  before meals and at bedtime.   . gabapentin (NEURONTIN) 300 MG capsule Take 300 mg by mouth 2 (two) times daily.   Marland Kitchen levothyroxine (SYNTHROID, LEVOTHROID) 25 MCG tablet Take 25 mcg by mouth daily. 07/30/2016: Received from: External Pharmacy  . metFORMIN (GLUCOPHAGE) 500 MG tablet Take 500 mg by mouth daily.   . Omega-3 Fatty Acids (FISH OIL PO) Take 2,000 mg by mouth in the morning and at bedtime.   Marland Kitchen omeprazole (PRILOSEC) 40 MG capsule Take 40 mg by mouth daily.   . QUEtiapine (SEROQUEL) 50 MG tablet Take 50 mg by mouth at bedtime.   . simvastatin (ZOCOR) 10 MG tablet Take 10 mg by mouth daily. 07/30/2016: Received from: External Pharmacy  . spironolactone (ALDACTONE) 50 MG tablet Take 50 mg by mouth 2 (two) times daily.    No facility-administered encounter medications on file as of 05/17/2021.    Functional Status:  No flowsheet data found.  Fall/Depression Screening: No flowsheet data found. PHQ 2/9 Scores 06/22/2020  PHQ - 2 Score 2  PHQ- 9 Score 4   Some encounter information is confidential and restricted. Go to Review Flowsheets activity to see all data.    Assessment:  Goals Addressed            This Visit's Progress   . (THN)Monitor and Manage My Blood Sugar-Diabetes Type 2   On track    Timeframe:  Long-Range Goal Priority:  High Start Date:   [id number]                          Expected End Date:  [id number]                   Follow Up Date [id number]   - check blood sugar at prescribed times - check blood sugar if I feel it is too high or too low - take the blood sugar log to all doctor visits - take the blood sugar meter to all doctor visits    Why is this important?    Checking your blood sugar at home helps to keep it from getting very high or very low.   Writing the results in a diary or log helps the doctor know how to care for you.   Your blood sugar log should have the time, date and the results.   Also, write down the amount of insulin or other medicine that you take.   Other  information, like what you ate, exercise done and how you were feeling, will also be helpful.     Notes:  [id number] Patient has not been checking her blood sugars [id number] Patient is managing her blood sugars with her diet    . [facility] Eye Exam-Diabetes Type 2   Not on track    Timeframe:  Long-Range Goal Priority:  Medium Start Date:    [id number]                         Expected End Date:   [id number]                   Follow Up Date [id number]   - schedule appointment with eye doctor    Why is this important?    Eye check-ups are important when you have diabetes.   Vision loss can be prevented.    Notes:  [id number]    . (THN)Perform Foot Care-Diabetes Type 2   On track    Timeframe:  Long-Range Goal Priority:  Medium Start Date:      08144818                       Expected End Date:     [id number]                  Follow Up Date [id number]  - check feet daily for cuts, sores or redness - trim toenails straight across -  wash and dry feet carefully every day - wear comfortable, cotton socks - wear comfortable, well-fitting shoes    Why is this important?    Good foot care is very important when you have diabetes.   There are many things you can do to keep your feet healthy and catch a problem early.    Notes:  50277412 Patient is monitoring feet daily    . (THN)Set My Target A1C-Diabetes Type 2   On track    Timeframe:  Long-Range Goal Priority:  High Start Date:    [id number]                         Expected End Date:    09470962                   Follow Up Date 83662947   - set target A1C    Why is this important?    Your target A1C is decided together by you and your doctor.   It is based on several things like your age and other health issues.    Notes:  A1C 6.5 [id number] A1C 6.8       Plan:  Follow-up:  Patient agrees to Care Plan and Follow-up. RN ordered patient free COVID test Patient will continue with therapy RN discussed healthy eating RN will follow up within the month of September RN sent update assessment to PCP  [place] Management [phone]

## 2021-05-23 ENCOUNTER — Ambulatory Visit (HOSPITAL_COMMUNITY): Payer: Medicare Other | Admitting: Clinical

## 2021-05-23 ENCOUNTER — Other Ambulatory Visit: Payer: Self-pay

## 2021-05-30 ENCOUNTER — Ambulatory Visit (INDEPENDENT_AMBULATORY_CARE_PROVIDER_SITE_OTHER): Payer: Medicare Other | Admitting: Clinical

## 2021-05-30 ENCOUNTER — Other Ambulatory Visit: Payer: Self-pay

## 2021-05-30 DIAGNOSIS — F331 Major depressive disorder, recurrent, moderate: Secondary | ICD-10-CM | POA: Diagnosis not present

## 2021-05-30 NOTE — Progress Notes (Signed)
Virtual Visit via Telephone Note   I connected with [name] on 05/30/21 at 9:00 AM EDT by telephone and verified that I am speaking with the correct person using two identifiers.   Location: Patient: Home Provider: Office   I discussed the limitations, risks, security and privacy concerns of performing an evaluation and management service by telephone and the availability of in person appointments. I also discussed with the patient that there may be a patient responsible charge related to this service. The patient expressed understanding and agreed to proceed.                   THERAPY PROGRESS NOTE   Session Time: 9:00AM-9:40AM   Participation Level: Active   Behavioral Response: CasualAlertDepressed   Type of Therapy: Individual Therapy   Treatment Goals addressed: Anger and Coping   Interventions: CBT, Motivational Interviewing, Solution Focused and Strength-based   Summary: [name] is a 60 y.o. female who presents with Depression . The OPT therapist worked with the patient for her ongoing OPT treatment. The OPT therapist utilized Motivational Interviewing to assist in creating therapeutic repore. The patient in the session was engaged and work in collaboration giving feedback about her triggers and symptoms over the past few weeks including  dealing with the ongoing emotional toll of caregiving for her terminal partner. The patient verbalized feeling overwhelmed with the home demands and heightened sense of crisis at home with her partners health condition continuing to deteriorate. The OPT therapist utilized Cognitive Behavioral Therapy through cognitive restructuring as well as worked with the patient on coping strategies to assist in management of mood including validating the patients feelings and emotions. The OPT therapist worked with the patient providing support and psycho-education. The OPT therapist continued to work with the patient on maintaining her focus  on her own health.    Suicidal/Homicidal: Nowithout intent/plan   Therapist Response: The OPT therapist worked with the patient for the patients scheduled session. The patient was engaged in her session and gave feedback in relation to triggers, symptoms, and behavior responses over the past few weeks. The OPT therapist worked with the patient utilizing an in session Cognitive Behavioral Therapy exercise. The patient was responsive in the session and verbalized, " Th hospice workers have been a help but I thing I am so tired now when I take breaks it does seem as effective or when I go back and I am under stress it kicks right back in. ". The patient continued to verbalized her understanding on the need to take a break and focus on her on health while moving forward with her partners end of life process and utilizing the hospice team as a resource to take some of her caregiving task off of her. The OPT therapist talked about the effects of lengthy duress.The OPT therapist gave support while normalizing her range of emotions. The OPT therapist will continue treatment work with the patient in her next scheduled session    Plan: Return again in 2/3 weeks.   Diagnosis:      Axis I: Major Depressive Disorder, Recurrent, Moderate                           Axis II: No diagnosis   I discussed the assessment and treatment plan with the patient. The patient was provided an opportunity to ask questions and all were answered. The patient agreed with the plan and demonstrated an understanding  of the instructions.   The patient was advised to call back or seek an in-person evaluation if the symptoms worsen or if the condition fails to improve as anticipated.   I provided 40 minutes of non-face-to-face time during this encounter.   [name], LCSW   05/30/2021

## 2021-06-20 ENCOUNTER — Other Ambulatory Visit: Payer: Self-pay

## 2021-06-20 ENCOUNTER — Ambulatory Visit (INDEPENDENT_AMBULATORY_CARE_PROVIDER_SITE_OTHER): Payer: Medicare Other | Admitting: Clinical

## 2021-06-20 DIAGNOSIS — F331 Major depressive disorder, recurrent, moderate: Secondary | ICD-10-CM

## 2021-06-20 NOTE — Progress Notes (Signed)
Virtual Visit via Video Note  I connected with [name] on 06/20/21 at  1:00 PM EDT by a video enabled telemedicine application and verified that I am speaking with the correct person using two identifiers.  Location: Patient: Home Provider: Office   I discussed the limitations of evaluation and management by telemedicine and the availability of in person appointments. The patient expressed understanding and agreed to proceed.     THERAPY PROGRESS NOTE   Session Time: 1:00PM-1:45PM   Participation Level: Active   Behavioral Response: CasualAlertDepressed   Type of Therapy: Individual Therapy   Treatment Goals addressed: Anger and Coping   Interventions: CBT, Motivational Interviewing, Solution Focused and Strength-based   Summary: [name] is a 60 y.o. female who presents with Depression . The OPT therapist worked with the patient for her ongoing OPT treatment. The OPT therapist utilized Motivational Interviewing to assist in creating therapeutic repore. The patient in the session was engaged and work in collaboration giving feedback about her triggers and symptoms over the past few weeks including  dealing with the ongoing emotional toll of caregiving for her terminal partner. The patient verbalized feeling overwhelmed with the home demands and heightened sense of crisis at home with her partners health condition continuing to deteriorate. The OPT therapist utilized Cognitive Behavioral Therapy through cognitive restructuring as well as worked with the patient on coping strategies to assist in management of mood including validating the patients feelings and emotions with her goal of just maintaining currently with the situation at home as it stands. The OPT therapist worked with the patient providing support and psycho-education. The OPT therapist continued to work with the patient on maintaining her focus on her own health.    Suicidal/Homicidal: Nowithout  intent/plan   Therapist Response: The OPT therapist worked with the patient for the patients scheduled session. The patient was engaged in her session and gave feedback in relation to triggers, symptoms, and behavior responses over the past few weeks. The OPT therapist worked with the patient utilizing an in session Cognitive Behavioral Therapy exercise. The patient was responsive in the session and verbalized, " He has a birthday coming up and I am hoping we can go to [place] to celebrate because he has been confined to the house and if we could go out I have our church family that could come be involved.". The patient continued to verbalized her understanding on the need to take a break and focus on her on health and taking breaks as needed with hospice involved to continue to focus on her own health and wellbeing. The OPT therapist talked about the effects of lengthy duress.The OPT therapist gave support while normalizing her range of emotions. The OPT therapist will continue treatment work with the patient in her next scheduled session    Plan: Return again in 2/3 weeks.   Diagnosis:      Axis I: Major Depressive Disorder, Recurrent, Moderate                           Axis II: No diagnosis   I discussed the assessment and treatment plan with the patient. The patient was provided an opportunity to ask questions and all were answered. The patient agreed with the plan and demonstrated an understanding of the instructions.   The patient was advised to call back or seek an in-person evaluation if the symptoms worsen or if the condition fails to improve as anticipated.  I provided 45 minutes of non-face-to-face time during this encounter.   [name], LCSW   06/20/2021

## 2021-07-02 NOTE — Progress Notes (Signed)
Virtual Visit via Video Note  I connected with [name] on 07/04/21 at 10:00 AM EDT by a video enabled telemedicine application and verified that I am speaking with the correct person using two identifiers.  Location: Patient: work Provider: office Persons participated in the visit- patient, provider    I discussed the limitations of evaluation and management by telemedicine and the availability of in person appointments. The patient expressed understanding and agreed to proceed.    I discussed the assessment and treatment plan with the patient. The patient was provided an opportunity to ask questions and all were answered. The patient agreed with the plan and demonstrated an understanding of the instructions.   The patient was advised to call back or seek an in-person evaluation if the symptoms worsen or if the condition fails to improve as anticipated.  I provided 17 minutes of non-face-to-face time during this encounter.   Stacey Clay, MD     [facility] MD/PA/NP OP Progress Note  07/04/2021 10:25 AM [name]  MRN:  [id number]  Chief Complaint:  Chief Complaint   Follow-up; Depression    HPI:  This is a follow-up appointment for depression.  She states that she is not doing good.  There is "no peace whatsoever "at home.  Her husband calls her every day.  Although she knows that this is related to his condition, she feels more irritated.  She also feels that he is nagging her.  Although the hospice team visits her husband, she thinks he is in denial.  She occasionally needs to leave the house to feel better.  She is concerned about weight gain, although she is on healthy diet.  She agrees to discuss this with her PCP given quetiapine can cause this side effect.  She sleeps fair.  She feels fatigue.  She has occasional anhedonia.  She has difficulty in concentration.  She denies SI.  She is willing to try higher dose of bupropion.   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 Used to work at [employer] before pandemic Exercise: started to [employer]: her daughter, church family Household: husband with COPD Marital status: 14 years/12 years of relationship before marriage, divorced in [place] Number of children: 2 from her previous marriage (1 son, 1 daughter), 2 from his marriage. She had "great" relationship with her mother. She used to take care of her mother, who  deceased in 10-03-2018 (two hours after her grandmother deceased). Her father deceased in 04-02-02. She has seven siblings  Visit Diagnosis:    ICD-10-CM   1. Major depressive disorder, recurrent episode, moderate (HCC)  F33.1     2. Insomnia, unspecified type  G47.00       Past Psychiatric History: Please see initial evaluation for full details. I have reviewed the history. No updates at this time.     Past Medical History:  Past Medical History:  Diagnosis Date   Anxiety    Asthma    Chronic back pain    Depression    Diverticulitis    complicated by abscess requiring total colectomy and take-down of colostomy in 04/03/03   GERD (gastroesophageal reflux disease)    Hemorrhoids    Hiatal hernia    HTN (hypertension)    Hypothyroidism    IBS (irritable bowel syndrome)    Migraine    Neck pain, chronic    Obesity    Polyp of colon, hyperplastic    [facility]  Mountain spotted fever    Terminal ileitis of small intestine ([place])    referred to [facility], Crohn's ruled out   Thyroid disease     Past Surgical History:  Procedure Laterality Date   APPENDECTOMY     CHOLECYSTECTOMY  2004   at time of colectomy   COLONOSCOPY  03/2009   anastomotic ulcers and neoterminal ileum ulcers, anastomosis with SB at 30cm. bx s/o Crohn's   COLONOSCOPY  2005   Dr. Irving Shows: rectal ulcers   COLONOSCOPY  2011   [facility]   COLONOSCOPY N/A 01/30/2016   Dr.Rourk- internal hemorrhoids, likely source of hematochezia, rectal polyps removed s/p subtotal colectomy with  normal appearing residual colonic and small bowel mucosa bx= hyperplasticpolyp   ESOPHAGOGASTRODUODENOSCOPY  02/2009   mild erosive reflux esophagitis   ESOPHAGOGASTRODUODENOSCOPY  07/2010   probable cervical esophageal web s/p dilation   ESOPHAGOGASTRODUODENOSCOPY N/A 01/30/2016   Dr.Rourk- normal apearing patent tubular esophagus, hiatal hernia   ethmoid mucocele, turbinate surgery  2002   right   GIVENS CAPSULE STUDY  11/2009   Dr. Gala Romney: subtle erytherma and edema of TI   HEMORRHOID BANDING  2017   Dr.Rourk   hysterectomy  2009   partial   LIVER BIOPSY  2004   steatosis, at time of colectomy   SUBTOTAL COLECTOMY  2004   diverticulitis with abscess   UMBILICAL HERNIA REPAIR      Family Psychiatric History: Please see initial evaluation for full details. I have reviewed the history. No updates at this time.     Family History:  Family History  Problem Relation Age of Onset   ALS Father    Crohn's disease Daughter    Diabetes Mother    Rheum arthritis Mother     Social History:  Social History   Socioeconomic History   Marital status: Married    Spouse name: Not on file   Number of children: 2   Years of education: Not on file   Highest education level: Not on file  Occupational History    Employer: UNEMPLOYED  Tobacco Use   Smoking status: Former    Packs/day: 0.50    Years: 10.00    Pack years: 5.00    Types: Cigarettes    Quit date: 12/15/2006    Years since quitting: 14.5   Smokeless tobacco: Never  Vaping Use   Vaping Use: Never used  Substance and Sexual Activity   Alcohol use: Not Currently    Alcohol/week: 0.0 standard drinks   Drug use: No   Sexual activity: Yes    Birth control/protection: Surgical  Other Topics Concern   Not on file  Social History Narrative   Not on file   Social Determinants of Health   Financial Resource Strain: Not on file  Food Insecurity: Not on file  Transportation Needs: Not on file  Physical Activity: Not on  file  Stress: Not on file  Social Connections: Not on file    Allergies:  Allergies  Allergen Reactions   Hydromorphone Hcl Itching    REACTION: Dilaudid    Metabolic Disorder Labs: Lab Results  Component Value Date   HGBA1C 6.4 (H) 06/03/2011   MPG 137 (H) 06/03/2011   MPG 134 02/22/2009   No results found for: PROLACTIN No results found for: CHOL, TRIG, HDL, CHOLHDL, VLDL, LDLCALC Lab Results  Component Value Date   TSH 0.389 04/09/2011    Therapeutic Level Labs: No results found for: LITHIUM No results found for: VALPROATE No  components found for:  CBMZ  Current Medications: Current Outpatient Medications  Medication Sig Dispense Refill   buPROPion (WELLBUTRIN XL) 150 MG 24 hr tablet Total of 450 mg daily. Take along with 300 mg tab 30 tablet 2   [START ON 07/13/2021] buPROPion (WELLBUTRIN XL) 300 MG 24 hr tablet Total of 450 mg daily. Take along with 150 mg tab 30 tablet 2   Calcium Carb-Cholecalciferol (CALCIUM 600+D3 PO) Take 1 tablet by mouth daily.     dicyclomine (BENTYL) 10 MG capsule Take 10 mg by mouth 4 (four) times daily -  before meals and at bedtime.     gabapentin (NEURONTIN) 300 MG capsule Take 300 mg by mouth 2 (two) times daily.     levothyroxine (SYNTHROID, LEVOTHROID) 25 MCG tablet Take 25 mcg by mouth daily.     metFORMIN (GLUCOPHAGE) 500 MG tablet Take 500 mg by mouth daily.     Omega-3 Fatty Acids (FISH OIL PO) Take 2,000 mg by mouth in the morning and at bedtime.     omeprazole (PRILOSEC) 40 MG capsule Take 40 mg by mouth daily.     QUEtiapine (SEROQUEL) 50 MG tablet Take 50 mg by mouth at bedtime.     simvastatin (ZOCOR) 10 MG tablet Take 10 mg by mouth daily.     spironolactone (ALDACTONE) 50 MG tablet Take 50 mg by mouth 2 (two) times daily.     No current facility-administered medications for this visit.     Musculoskeletal: Strength & Muscle Tone:  N/A Gait & Station:  N/A Patient leans: N/A  Psychiatric Specialty Exam: Review of  Systems  Psychiatric/Behavioral:  Positive for decreased concentration. Negative for agitation, behavioral problems, confusion, dysphoric mood, hallucinations, self-injury, sleep disturbance and suicidal ideas. The patient is nervous/anxious. The patient is not hyperactive.   All other systems reviewed and are negative.  There were no vitals taken for this visit.There is no height or weight on file to calculate BMI.  General Appearance: Fairly Groomed  Eye Contact:  Good  Speech:  Clear and Coherent  Volume:  Normal  Mood:   not good  Affect:  Appropriate, Congruent, and slightly down  Thought Process:  Coherent  Orientation:  Full (Time, Place, and Person)  Thought Content: Logical   Suicidal Thoughts:  No  Homicidal Thoughts:  No  Memory:  Immediate;   Good  Judgement:  Good  Insight:  Good  Psychomotor Activity:  Normal  Concentration:  Concentration: Good and Attention Span: Good  Recall:  Good  Fund of Knowledge: Good  Language: Good  Akathisia:  No  Handed:  Right  AIMS (if indicated): not done  Assets:  Communication Skills Desire for Improvement  ADL's:  Intact  Cognition: WNL  Sleep:  Good   Screenings: PHQ2-9    Flowsheet Row Video Visit from 07/04/2021 in [place] Video Visit from 03/13/2021 in [place] Patient Outreach Telephone from 06/22/2020 in [place]  PHQ-2 Total Score 1 0 2  PHQ-9 Total Score -- -- 4      Flowsheet Row Video Visit from 07/04/2021 in [place] Video Visit from 03/13/2021 in [place] No Risk No Risk        Assessment and Plan:  TOM RAGSDALE is a 60 y.o. year old female with a history of depression, anxiety, diabetes, who presents for follow up appointment for below.   1. Major depressive disorder,  recurrent episode, moderate ([place]) She continues  to report occasional depressive symptoms despite restarting bupropion.  Psychosocial stressors includes being a caregiver of her husband at home.  We do further up titration of bupropion to optimize treatment for depression.  She has no known history of seizure.  She was advised again to discuss with her PCP about possible discontinuation of quetiapine given her reported weight gain.   2. Insomnia, unspecified type Unchanged.  She has history of snoring, and has insomnia and fatigue.  Made referral for sleep evaluation.  She would like to hold the appointment at this time due to conflict in schedule.    Plan 1. Increase bupropion 450 mg daily  2. Next appointment: 10/12 at 1 PM for 30 mins, video 3. Referred for sleep evaluation - on quetiapine 50 mg at night, gabapentin 300 mg BID, prescribed by PCP - TSH is to be checked by her PCP in April    - She may be on imipramine according to Epic for unknown indication. Will obtain medication list from the pharmacy.      The patient demonstrates the following risk factors for suicide: Chronic risk factors for suicide include: psychiatric disorder of depression. Acute risk factors for suicide include: family or marital conflict. Protective factors for this patient include: positive social support, coping skills and hope for the future. Considering these factors, the overall suicide risk at this point appears to be low. Patient is appropriate for outpatient follow up.        Stacey Clay, MD 07/04/2021, 10:25 AM

## 2021-07-04 ENCOUNTER — Other Ambulatory Visit: Payer: Self-pay

## 2021-07-04 ENCOUNTER — Encounter: Payer: Self-pay | Admitting: Psychiatry

## 2021-07-04 ENCOUNTER — Telehealth (INDEPENDENT_AMBULATORY_CARE_PROVIDER_SITE_OTHER): Payer: Medicare Other | Admitting: Psychiatry

## 2021-07-04 DIAGNOSIS — G4700 Insomnia, unspecified: Secondary | ICD-10-CM

## 2021-07-04 DIAGNOSIS — F331 Major depressive disorder, recurrent, moderate: Secondary | ICD-10-CM | POA: Diagnosis not present

## 2021-07-04 MED ORDER — BUPROPION HCL ER (XL) 150 MG PO TB24: ORAL_TABLET | ORAL | 2 refills | Status: AC

## 2021-07-04 MED ORDER — BUPROPION HCL ER (XL) 300 MG PO TB24: ORAL_TABLET | ORAL | 2 refills | Status: AC

## 2021-07-04 NOTE — Patient Instructions (Signed)
1. Increase bupropion 450 mg daily  2. Next appointment: 10/12 at 1 PM

## 2021-07-10 DIAGNOSIS — K219 Gastro-esophageal reflux disease without esophagitis: Secondary | ICD-10-CM | POA: Diagnosis not present

## 2021-07-10 DIAGNOSIS — E032 Hypothyroidism due to medicaments and other exogenous substances: Secondary | ICD-10-CM | POA: Diagnosis not present

## 2021-07-10 DIAGNOSIS — I700 Atherosclerosis of aorta: Secondary | ICD-10-CM | POA: Diagnosis not present

## 2021-07-10 DIAGNOSIS — I10 Essential (primary) hypertension: Secondary | ICD-10-CM | POA: Diagnosis not present

## 2021-07-10 DIAGNOSIS — E1143 Type 2 diabetes mellitus with diabetic autonomic (poly)neuropathy: Secondary | ICD-10-CM | POA: Diagnosis not present

## 2021-07-15 DIAGNOSIS — E1143 Type 2 diabetes mellitus with diabetic autonomic (poly)neuropathy: Secondary | ICD-10-CM | POA: Diagnosis not present

## 2021-07-15 DIAGNOSIS — I700 Atherosclerosis of aorta: Secondary | ICD-10-CM | POA: Diagnosis not present

## 2021-07-15 DIAGNOSIS — K219 Gastro-esophageal reflux disease without esophagitis: Secondary | ICD-10-CM | POA: Diagnosis not present

## 2021-07-15 DIAGNOSIS — I10 Essential (primary) hypertension: Secondary | ICD-10-CM | POA: Diagnosis not present

## 2021-08-27 ENCOUNTER — Ambulatory Visit (INDEPENDENT_AMBULATORY_CARE_PROVIDER_SITE_OTHER): Payer: Medicare Other | Admitting: Clinical

## 2021-08-27 ENCOUNTER — Other Ambulatory Visit: Payer: Self-pay

## 2021-08-27 ENCOUNTER — Other Ambulatory Visit: Payer: Self-pay | Admitting: *Deleted

## 2021-08-27 DIAGNOSIS — F331 Major depressive disorder, recurrent, moderate: Secondary | ICD-10-CM | POA: Diagnosis not present

## 2021-08-27 NOTE — Progress Notes (Signed)
Virtual Visit via Telephone Note  I connected with [name] on 08/27/21 at  2:00 PM EDT by telephone and verified that I am speaking with the correct person using two identifiers.  Location: Patient: Home Provider: Office   I discussed the limitations, risks, security and privacy concerns of performing an evaluation and management service by telephone and the availability of in person appointments. I also discussed with the patient that there may be a patient responsible charge related to this service. The patient expressed understanding and agreed to proceed.   THERAPY PROGRESS NOTE   Session Time: 1:00PM-1:40PM   Participation Level: Active   Behavioral Response: CasualAlertDepressed   Type of Therapy: Individual Therapy   Treatment Goals addressed: Anger and Coping   Interventions: CBT, Motivational Interviewing, Solution Focused and Strength-based   Summary: Stacey Shaddock. Gill is a 60 y.o. female who presents with Depression . The OPT therapist worked with the patient for her ongoing OPT treatment. The OPT therapist utilized Motivational Interviewing to assist in creating therapeutic repore. The patient in the session was engaged and work in collaboration giving feedback about her triggers and symptoms over the past few weeks including  dealing with the ongoing emotional toll of caregiving for her terminal partner. The patient verbalized feeling overwhelmed with the home demands and heightened sense of crisis at home with her partners health condition continuing to deteriorate. The OPT therapist utilized Cognitive Behavioral Therapy through cognitive restructuring as well as worked with the patient on coping strategies to assist in management of mood including validating the patients feelings and emotions with her goal of just maintaining currently with the situation at home as it stands.  The OPT therapist continued to work with the patient on self check ins and making sure she  is addressing her own  basic needs. The OPT therapist worked with the patient providing support and psycho-education. The OPT therapist continued to work with the patient on maintaining her focus on her own health.    Suicidal/Homicidal: Nowithout intent/plan   Therapist Response: The OPT therapist worked with the patient for the patients scheduled session. The patient was engaged in her session and gave feedback in relation to triggers, symptoms, and behavior responses over the past few weeks. The OPT therapist worked with the patient utilizing an in session Cognitive Behavioral Therapy exercise. The patient was responsive in the session and verbalized, " I just keep my faith in god and I am realizing I have to take my breaks away from home when I need to". The patient continued to verbalized her understanding on the need to take a break and focus on her on health and taking breaks as needed. The OPT therapist talked about the effects of lengthy duress and noting she may need to double down on her coping.The OPT therapist gave support while normalizing her range of emotions. The OPT therapist will continue treatment work with the patient in her next scheduled session    Plan: Return again in 2/3 weeks.   Diagnosis:      Axis I: Major Depressive Disorder, Recurrent, Moderate                           Axis II: No diagnosis   I discussed the assessment and treatment plan with the patient. The patient was provided an opportunity to ask questions and all were answered. The patient agreed with the plan and demonstrated an understanding of the instructions.  The patient was advised to call back or seek an in-person evaluation if the symptoms worsen or if the condition fails to improve as anticipated.   I provided 40 minutes of non-face-to-face time during this encounter.   [name], LCSW   08/27/2021

## 2021-08-27 NOTE — Patient Instructions (Signed)
Goals Addressed             This Visit's Progress    (THN)Monitor and Manage My Blood Sugar-Diabetes Type 2   On track    Timeframe:  Long-Range Goal Priority:  High Start Date:   [id number]                          Expected End Date:  [id number]                   Follow Up Date [id number]   - check blood sugar at prescribed times - check blood sugar if I feel it is too high or too low - take the blood sugar log to all doctor visits - take the blood sugar meter to all doctor visits    Why is this important?   Checking your blood sugar at home helps to keep it from getting very high or very low.  Writing the results in a diary or log helps the doctor know how to care for you.  Your blood sugar log should have the time, date and the results.  Also, write down the amount of insulin or other medicine that you take.  Other information, like what you ate, exercise done and how you were feeling, will also be helpful.     Notes:  [id number] Patient has not been checking her blood sugars [id number] Patient is managing her blood sugars with her diet [id number] Patient is managing blood sugars with her diet and medication     (THN)Obtain Eye Exam-Diabetes Type 2   On track    Timeframe:  Long-Range Goal Priority:  Medium Start Date:    [id number]                         Expected End Date:   [id number]                   Follow Up Date [id number]   - schedule appointment with eye doctor    Why is this important?   Eye check-ups are important when you have diabetes.  Vision loss can be prevented.    Notes:  [id number] Per patient she has scheduled for this month     (THN)Perform Foot Care-Diabetes Type 2   On track    Timeframe:  Long-Range Goal Priority:  Medium Start Date:      [id number]                       Expected End Date:     [id number]                  Follow Up Date [id number]  - check feet daily for cuts, sores or redness - trim toenails straight across - wash and dry feet  carefully every day - wear comfortable, cotton socks - wear comfortable, well-fitting shoes    Why is this important?   Good foot care is very important when you have diabetes.  There are many things you can do to keep your feet healthy and catch a problem early.    Notes:  [id number] Patient is monitoring feet daily [id number] Patient is monitoring feet for wounds and skin care     (THN)Set My Target A1C-Diabetes Type 2   On track    Timeframe:  Long-Range Goal Priority:  High Start Date:    [id number]  Expected End Date:    [id number]                   Follow Up Date [id number]   - set target A1C    Why is this important?   Your target A1C is decided together by you and your doctor.  It is based on several things like your age and other health issues.    Notes:  A1C 6.5 [id number] A1C 6.8 [id number] A1C is 6.8

## 2021-08-27 NOTE — Patient Outreach (Signed)
[facility]) Care Management  [name]  08/27/2021   [name] 1961/01/30 [id number]  RN Health Coach telephone call to patient.  [employer] compliance verified. Per patient she has not been checking her blood sugars. She has been maintaining her diet. Per patient she drinks water and unsweetened tea. She goes to the gym and pool everyday. Patient stated she is active in church and goes out to eat with friends to help relieve stress. Patient is receiving counseling. Her husband is under hospice care. Patient has agreed to follow up outreach call.  Encounter Medications:  Outpatient Encounter Medications as of 08/27/2021  Medication Sig Note   buPROPion (WELLBUTRIN XL) 150 MG 24 hr tablet Total of 450 mg daily. Take along with 300 mg tab    buPROPion (WELLBUTRIN XL) 300 MG 24 hr tablet Total of 450 mg daily. Take along with 150 mg tab    Calcium Carb-Cholecalciferol (CALCIUM 600+D3 PO) Take 1 tablet by mouth daily.    dicyclomine (BENTYL) 10 MG capsule Take 10 mg by mouth 4 (four) times daily -  before meals and at bedtime.    gabapentin (NEURONTIN) 300 MG capsule Take 300 mg by mouth 2 (two) times daily.    levothyroxine (SYNTHROID, LEVOTHROID) 25 MCG tablet Take 25 mcg by mouth daily. 07/30/2016: Received from: External Pharmacy   metFORMIN (GLUCOPHAGE) 500 MG tablet Take 500 mg by mouth daily.    Omega-3 Fatty Acids (FISH OIL PO) Take 2,000 mg by mouth in the morning and at bedtime.    omeprazole (PRILOSEC) 40 MG capsule Take 40 mg by mouth daily.    QUEtiapine (SEROQUEL) 50 MG tablet Take 50 mg by mouth at bedtime.    simvastatin (ZOCOR) 10 MG tablet Take 10 mg by mouth daily. 07/30/2016: Received from: External Pharmacy   spironolactone (ALDACTONE) 50 MG tablet Take 50 mg by mouth 2 (two) times daily.    No facility-administered encounter medications on file as of 08/27/2021.    Functional Status:  No flowsheet data found.  Fall/Depression  Screening: No flowsheet data found. PHQ 2/9 Scores 06/22/2020  PHQ - 2 Score 2  PHQ- 9 Score 4  Some encounter information is confidential and restricted. Go to Review Flowsheets activity to see all data.    Assessment:   Care Plan Care Plan : Diabetes Type 2 (Adult)  Updates made by Verlin Grills, RN since 08/27/2021 12:00 AM     Problem: Glycemic Management (Diabetes, Type 2)   Priority: Medium  Onset Date: 02/21/2021     Long-Range Goal: Glycemic Management Optimized   Start Date: 02/19/2021  Expected End Date: 12/14/2021  This Visit's Progress: On track  Priority: High  Note:   Evidence-based guidance:  Anticipate A1C testing (point-of-care) every 3 to 6 months based on goal attainment.  Review mutually-set A1C goal or target range.  Anticipate use of antihyperglycemic with or without insulin and periodic adjustments; consider active involvement of pharmacist.  Provide medical nutrition therapy and development of individualized eating.  Compare self-reported symptoms of hypo or hyperglycemia to blood glucose levels, diet and fluid intake, current medications, psychosocial and physiologic stressors, change in activity and barriers to care adherence.  Promote self-monitoring of blood glucose levels.  Assess and address barriers to management plan, such as food insecurity, age, developmental ability, depression, anxiety, fear of hypoglycemia or weight gain, as well as medication cost, side effects and complicated regimen.  Consider referral to community-based diabetes education program, visiting nurse, community health  worker or Engineer, maintenance.  Encourage regular dental care for treatment of periodontal disease; refer to dental provider when needed.   Notes:  [id number] Patient A1C is 6.8 slight increase from last A1C 6.5    Problem: Disease Progression (Diabetes, Type 2)   Priority: Medium  Onset Date: 02/20/2021     Long-Range Goal: Disease Progression Prevented or Minimized    Start Date: 02/19/2021  Expected End Date: 12/14/2021  This Visit's Progress: On track  Recent Progress: On track  Priority: Medium  Note:   Evidence-based guidance:  Prepare patient for laboratory and diagnostic exams based on risk and presentation.  Encourage lifestyle changes, such as increased intake of plant-based foods, stress reduction, consistent physical activity and smoking cessation to prevent long-term complications and chronic disease.   Individualize activity and exercise recommendations while considering potential limitations, such as neuropathy, retinopathy or the ability to prevent hyperglycemia or hypoglycemia.   Prepare patient for use of pharmacologic therapy that may include antihypertensive, analgesic, prostaglandin E1 with periodic adjustments, based on presenting chronic condition and laboratory results.  Assess signs/symptoms and risk factors for hypertension, sleep-disordered breathing, neuropathy (including changes in gait and balance), retinopathy, nephropathy and sexual dysfunction.  Address pregnancy planning and contraceptive choice, especially when prescribing antihypertensive or statin.  Ensure completion of annual comprehensive foot exam and dilated eye exam.   Implement additional individualized goals and interventions based on identified risk factors.  Prepare patient for consultation or referral for specialist care, such as ophthalmology, neurology, cardiology, podiatry, nephrology or perinatology.   Notes:       Goals Addressed             This Visit's Progress    (THN)Monitor and Manage My Blood Sugar-Diabetes Type 2   On track    Timeframe:  Long-Range Goal Priority:  High Start Date:   [id number]                          Expected End Date:  [id number]                   Follow Up Date [id number]   - check blood sugar at prescribed times - check blood sugar if I feel it is too high or too low - take the blood sugar log to all doctor visits - take  the blood sugar meter to all doctor visits    Why is this important?   Checking your blood sugar at home helps to keep it from getting very high or very low.  Writing the results in a diary or log helps the doctor know how to care for you.  Your blood sugar log should have the time, date and the results.  Also, write down the amount of insulin or other medicine that you take.  Other information, like what you ate, exercise done and how you were feeling, will also be helpful.     Notes:  [id number] Patient has not been checking her blood sugars [id number] Patient is managing her blood sugars with her diet [id number] Patient is managing blood sugars with her diet and medication     (THN)Obtain Eye Exam-Diabetes Type 2   On track    Timeframe:  Long-Range Goal Priority:  Medium Start Date:    [id number]                         Expected End Date:   [id number]  Follow Up Date [id number]   - schedule appointment with eye doctor    Why is this important?   Eye check-ups are important when you have diabetes.  Vision loss can be prevented.    Notes:  [id number] Per patient she has scheduled for this month     (THN)Perform Foot Care-Diabetes Type 2   On track    Timeframe:  Long-Range Goal Priority:  Medium Start Date:      [id number]                       Expected End Date:     [id number]                  Follow Up Date [id number]  - check feet daily for cuts, sores or redness - trim toenails straight across - wash and dry feet carefully every day - wear comfortable, cotton socks - wear comfortable, well-fitting shoes    Why is this important?   Good foot care is very important when you have diabetes.  There are many things you can do to keep your feet healthy and catch a problem early.    Notes:  [id number] Patient is monitoring feet daily [id number] Patient is monitoring feet for wounds and skin care     (THN)Set My Target A1C-Diabetes Type 2   On track    Timeframe:   Long-Range Goal Priority:  High Start Date:    [id number]                         Expected End Date:    [id number]                   Follow Up Date [id number]   - set target A1C    Why is this important?   Your target A1C is decided together by you and your doctor.  It is based on several things like your age and other health issues.    Notes:  A1C 6.5 [id number] A1C 6.8 [id number] A1C is 6.8        Plan:  Follow-up: Follow-up in 3 month(s) RN sent Guide for Your Journey with diabetes booklet Patient will follow up with eye exam this month RN reiterated monitoring blood sugars RN sent update assessment to PCP  [facility] [phone]

## 2021-09-04 DIAGNOSIS — Z7984 Long term (current) use of oral hypoglycemic drugs: Secondary | ICD-10-CM | POA: Diagnosis not present

## 2021-09-04 DIAGNOSIS — E119 Type 2 diabetes mellitus without complications: Secondary | ICD-10-CM | POA: Diagnosis not present

## 2021-09-04 DIAGNOSIS — H52223 Regular astigmatism, bilateral: Secondary | ICD-10-CM | POA: Diagnosis not present

## 2021-09-04 DIAGNOSIS — H524 Presbyopia: Secondary | ICD-10-CM | POA: Diagnosis not present

## 2021-09-04 DIAGNOSIS — H16223 Keratoconjunctivitis sicca, not specified as Sjogren's, bilateral: Secondary | ICD-10-CM | POA: Diagnosis not present

## 2021-09-24 ENCOUNTER — Other Ambulatory Visit: Payer: Self-pay

## 2021-09-24 ENCOUNTER — Ambulatory Visit (INDEPENDENT_AMBULATORY_CARE_PROVIDER_SITE_OTHER): Payer: Medicare Other | Admitting: Clinical

## 2021-09-24 DIAGNOSIS — F331 Major depressive disorder, recurrent, moderate: Secondary | ICD-10-CM | POA: Diagnosis not present

## 2021-09-24 NOTE — Progress Notes (Signed)
Virtual Visit via Telephone Note   I connected with [name] on 09/24/21 at  3:00 PM EDT by telephone and verified that I am speaking with the correct person using two identifiers.   Location: Patient: Home Provider: Office   I discussed the limitations, risks, security and privacy concerns of performing an evaluation and management service by telephone and the availability of in person appointments. I also discussed with the patient that there may be a patient responsible charge related to this service. The patient expressed understanding and agreed to proceed.     THERAPY PROGRESS NOTE   Session Time: 3:00PM-3:45PM   Participation Level: Active   Behavioral Response: CasualAlertDepressed   Type of Therapy: Individual Therapy   Treatment Goals addressed: Anger and Coping   Interventions: CBT, Motivational Interviewing, Solution Focused and Strength-based   Summary: [name] is a 60 y.o. female who presents with Depression . The OPT therapist worked with the patient for her ongoing OPT treatment. The OPT therapist utilized Motivational Interviewing to assist in creating therapeutic repore. The patient in the session was engaged and work in collaboration giving feedback about her triggers and symptoms over the past few weeks including  dealing with the ongoing emotional toll of caregiving for her terminal partner. The patient verbalized feeling overwhelmed with the home demands and heightened sense of crisis at home with her partners health condition continuing to deteriorate. The patients partner has continued to ignore the boundaries thew patient has put into place such as don't call my work unless its a emergency has added to the patients stress. The OPT therapist utilized Cognitive Behavioral Therapy through cognitive restructuring as well as worked with the patient on coping strategies to assist in management of mood including validating the patients feelings and  emotions with her goal of just maintaining currently with the situation at home as it stands.  The OPT therapist continued to work with the patient on self check ins and making sure she is addressing her own  basic needs. The OPT therapist worked with the patient providing support and psycho-education. The OPT therapist continued to work with the patient on maintaining her focus on her own health.    Suicidal/Homicidal: Nowithout intent/plan   Therapist Response: The OPT therapist worked with the patient for the patients scheduled session. The patient was engaged in her session and gave feedback in relation to triggers, symptoms, and behavior responses over the past few weeks. The OPT therapist worked with the patient utilizing an in session Cognitive Behavioral Therapy exercise. The patient was responsive in the session and verbalized, " I have tried to set boundaries but I have realized he is just going to agree and then ignore them like calling my work with non-emergency things when I have said do not call my job unless its an emergency cause I will take care of it once I get home". The patient continued to verbalized her ongoing understanding on the need to take a break and focus on her on health and taking breaks as needed.The OPT therapist gave support while normalizing her range of emotions. The OPT therapist will continue treatment work with the patient in her next scheduled session    Plan: Return again in 2/3 weeks.   Diagnosis:      Axis I: Major Depressive Disorder, Recurrent, Moderate                           Axis II:  No diagnosis   I discussed the assessment and treatment plan with the patient. The patient was provided an opportunity to ask questions and all were answered. The patient agreed with the plan and demonstrated an understanding of the instructions.   The patient was advised to call back or seek an in-person evaluation if the symptoms worsen or if the condition fails to improve  as anticipated.   I provided 45 minutes of non-face-to-face time during this encounter.   [name], LCSW   09/24/2021

## 2021-09-26 ENCOUNTER — Telehealth: Payer: Medicare Other | Admitting: Psychiatry

## 2021-10-15 DIAGNOSIS — E1143 Type 2 diabetes mellitus with diabetic autonomic (poly)neuropathy: Secondary | ICD-10-CM | POA: Diagnosis not present

## 2021-10-15 DIAGNOSIS — E119 Type 2 diabetes mellitus without complications: Secondary | ICD-10-CM | POA: Diagnosis not present

## 2021-10-15 DIAGNOSIS — I700 Atherosclerosis of aorta: Secondary | ICD-10-CM | POA: Diagnosis not present

## 2021-10-15 DIAGNOSIS — E782 Mixed hyperlipidemia: Secondary | ICD-10-CM | POA: Diagnosis not present

## 2021-10-15 DIAGNOSIS — E032 Hypothyroidism due to medicaments and other exogenous substances: Secondary | ICD-10-CM | POA: Diagnosis not present

## 2021-10-15 DIAGNOSIS — I10 Essential (primary) hypertension: Secondary | ICD-10-CM | POA: Diagnosis not present

## 2021-10-15 DIAGNOSIS — K219 Gastro-esophageal reflux disease without esophagitis: Secondary | ICD-10-CM | POA: Diagnosis not present

## 2021-10-16 NOTE — Telephone Encounter (Signed)
DELETE 

## 2021-11-05 DIAGNOSIS — H01004 Unspecified blepharitis left upper eyelid: Secondary | ICD-10-CM | POA: Diagnosis not present

## 2021-11-05 DIAGNOSIS — H01002 Unspecified blepharitis right lower eyelid: Secondary | ICD-10-CM | POA: Diagnosis not present

## 2021-11-05 DIAGNOSIS — H01001 Unspecified blepharitis right upper eyelid: Secondary | ICD-10-CM | POA: Diagnosis not present

## 2021-11-05 DIAGNOSIS — H2513 Age-related nuclear cataract, bilateral: Secondary | ICD-10-CM | POA: Diagnosis not present

## 2021-11-05 DIAGNOSIS — H01005 Unspecified blepharitis left lower eyelid: Secondary | ICD-10-CM | POA: Diagnosis not present

## 2021-11-14 ENCOUNTER — Other Ambulatory Visit: Payer: Self-pay

## 2021-11-14 ENCOUNTER — Ambulatory Visit (INDEPENDENT_AMBULATORY_CARE_PROVIDER_SITE_OTHER): Payer: Medicare Other | Admitting: Clinical

## 2021-11-14 DIAGNOSIS — F331 Major depressive disorder, recurrent, moderate: Secondary | ICD-10-CM | POA: Diagnosis not present

## 2021-11-14 NOTE — Progress Notes (Signed)
Virtual Visit via Telephone Note   I connected with [name] on 11/14/21 at  2:00 PM EDT by telephone and verified that I am speaking with the correct person using two identifiers.   Location: Patient: Home Provider: Office   I discussed the limitations, risks, security and privacy concerns of performing an evaluation and management service by telephone and the availability of in person appointments. I also discussed with the patient that there may be a patient responsible charge related to this service. The patient expressed understanding and agreed to proceed.     THERAPY PROGRESS NOTE   Session Time: 2:15PM-2:45PM   Participation Level: Active   Behavioral Response: CasualAlertDepressed   Type of Therapy: Individual Therapy   Treatment Goals addressed: Anger and Coping   Interventions: CBT, Motivational Interviewing, Solution Focused and Strength-based   Summary: [name]. [name] is a 60 y.o. female who presents with Depression . The OPT therapist worked with the patient for her ongoing OPT treatment. The OPT therapist utilized Motivational Interviewing to assist in creating therapeutic repore. The patient in the session was engaged and work in collaboration giving feedback about her triggers and symptoms over the past few weeks including revealing that her husband did pass away at the beginning of November. The patient reviewed the events leading up to and including the passing of her husband, as well as her process of grieving and adjusting. The OPT therapist utilized Cognitive Behavioral Therapy through cognitive restructuring as well as worked with the patient on coping strategies to assist in management of mood including validating the patients feelings and emotions with in moving forward post her late husband passing.  The OPT therapist continued to work with the patient on self check ins and making sure she is addressing her own basic needs. The OPT therapist worked with  the patient providing support and psycho-education. The OPT therapist worked with the patient on the grieving process and normalizing her range of emotions.The OPT therapist continued to work with the patient on maintaining her focus on her mental health.    Suicidal/Homicidal: Nowithout intent/plan   Therapist Response: The OPT therapist worked with the patient for the patients scheduled session. The patient was engaged in her session and gave feedback in relation to triggers, symptoms, and behavior responses over the past few weeks. The OPT therapist worked with the patient utilizing an in session Cognitive Behavioral Therapy exercise. The patient was responsive in the session and verbalized, " I was there with my husband up until he passed away at the hospital and I am thankful I had those last moments with him and knowing it was better for him to not be suffering any longer". The patient continued to verbalized her understanding on the need to keep her support team close and noted today was her first day back to work since her husbands passing. The OPT therapist will continue treatment work with the patient in her next scheduled session    Plan: Return again in 2/3 weeks.   Diagnosis:      Axis I: Major Depressive Disorder, Recurrent, Moderate                           Axis II: No diagnosis   I discussed the assessment and treatment plan with the patient. The patient was provided an opportunity to ask questions and all were answered. The patient agreed with the plan and demonstrated an understanding of the instructions.  The patient was advised to call back or seek an in-person evaluation if the symptoms worsen or if the condition fails to improve as anticipated.   I provided 1minutes of non-face-to-face time during this encounter.   [name], LCSW   11/14/2021

## 2021-12-03 ENCOUNTER — Other Ambulatory Visit: Payer: Self-pay | Admitting: *Deleted

## 2021-12-03 ENCOUNTER — Ambulatory Visit (INDEPENDENT_AMBULATORY_CARE_PROVIDER_SITE_OTHER): Payer: Medicare Other | Admitting: Clinical

## 2021-12-03 ENCOUNTER — Other Ambulatory Visit: Payer: Self-pay

## 2021-12-03 DIAGNOSIS — F331 Major depressive disorder, recurrent, moderate: Secondary | ICD-10-CM | POA: Diagnosis not present

## 2021-12-03 NOTE — Progress Notes (Signed)
Virtual Visit via Telephone Note   I connected with [name] on 12/03/21 at  3:00 PM EDT by telephone and verified that I am speaking with the correct person using two identifiers.   Location: Patient: Home Provider: Office   I discussed the limitations, risks, security and privacy concerns of performing an evaluation and management service by telephone and the availability of in person appointments. I also discussed with the patient that there may be a patient responsible charge related to this service. The patient expressed understanding and agreed to proceed.     THERAPY PROGRESS NOTE   Session Time: 3:00 PM-3:45 PM   Participation Level: Active   Behavioral Response: CasualAlertDepressed   Type of Therapy: Individual Therapy   Treatment Goals addressed: Anger and Coping   Interventions: CBT, Motivational Interviewing, Solution Focused and Strength-based   Summary: [name]. Carneal is a 60 y.o. female who presents with Depression . The OPT therapist worked with the patient for her ongoing OPT treatment. The OPT therapist utilized Motivational Interviewing to assist in creating therapeutic repore. The patient in the session was engaged and work in collaboration giving feedback about her triggers and symptoms over the past few weeks including ongoing adjusting to recently losing her husband in November. The patient reviewed the events including areas of difficulty in adjusting to the loss of her husband. The OPT therapist utilized Cognitive Behavioral Therapy through cognitive restructuring as well as worked with the patient on coping strategies to assist in management of mood including validating the patients feelings and emotions with in moving forward post her late husband passing.  The OPT therapist continued to work with the patient on self check ins and making sure she is addressing her own basic needs. The OPT therapist worked with the patient providing support and  psycho-education. The OPT therapist worked with the patient on the grieving process and normalizing her range of emotions.The OPT therapist continued to work with the patient on maintaining her focus on her mental health.    Suicidal/Homicidal: Nowithout intent/plan   Therapist Response: The OPT therapist worked with the patient for the patients scheduled session. The patient was engaged in her session and gave feedback in relation to triggers, symptoms, and behavior responses over the past few weeks. The OPT therapist worked with the patient utilizing an in session Cognitive Behavioral Therapy exercise. 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 The patient continued to verbalized her understanding on the need to keep her support team close as she prepares for going through the upcoming holidays. The OPT therapist will continue treatment work with the patient in her next scheduled session    Plan: Return again in 2/3 weeks.   Diagnosis:      Axis I: Major Depressive Disorder, Recurrent, Moderate                           Axis II: No diagnosis   I discussed the assessment and treatment plan with the patient. The patient was provided an opportunity to ask questions and all were answered. The patient agreed with the plan and demonstrated an understanding of  the instructions.   The patient was advised to call back or seek an in-person evaluation if the symptoms worsen or if the condition fails to improve as anticipated.   I provided 45 minutes of non-face-to-face time during this encounter.   [name], LCSW   12/03/2021

## 2021-12-06 NOTE — Patient Outreach (Signed)
[facility]) Care Management [place] Note   12/06/2021 Name:  [name] MRN:  [id number] DOB:  February 19, 1961  Summary: Per patient she has not been monitoring her blood sugars. Patient stated her husband had died. Patient stated she has been keeping her counseling appointments. Per patient she is going to the pool and church daily. She stated she is going to work part time to fill in the time.   Recommendations/Changes made from today's visit: Continue with counseling and look at grief counseling Monitor your blood sugars Take meter to Dr appointments     Subjective: [name] is an 60 y.o. year old female who is a primary patient of Hasanaj, Samul Dada, MD. The care management team was consulted for assistance with care management and/or care coordination needs.    RN Health Coach completed Telephone Visit today.   Objective:  Medications Reviewed Today     Reviewed by Norman Clay, MD (Psychiatrist) on 07/04/21 at 1027  Med List Status: <None>   Medication Order Taking? Sig Documenting Provider Last Dose Status Informant  buPROPion (WELLBUTRIN XL) 150 MG 24 hr tablet [id number] Yes Total of 450 mg daily. Take along with 300 mg tab Norman Clay, MD  Active   buPROPion (WELLBUTRIN XL) 300 MG 24 hr tablet [id number]  Total of 450 mg daily. Take along with 150 mg tab Norman Clay, MD  Active   Calcium Carb-Cholecalciferol (CALCIUM 600+D3 PO) [id number] No Take 1 tablet by mouth daily. [provider] Taking Active   dicyclomine (BENTYL) 10 MG capsule [id number] No Take 10 mg by mouth 4 (four) times daily -  before meals and at bedtime. [provider] Taking Active Self  gabapentin (NEURONTIN) 300 MG capsule [id number] No Take 300 mg by mouth 2 (two) times daily. [provider] Taking Active Self  levothyroxine (SYNTHROID, LEVOTHROID) 25 MCG tablet [id number] No Take 25 mcg by mouth daily. [provider] Taking Active  Self           Med Note Darrick Grinder, [place] Jul 30, 2016  7:57 AM) Received from: External Pharmacy  metFORMIN (GLUCOPHAGE) 500 MG tablet [id number] No Take 500 mg by mouth daily. [provider] Taking Active Self  Omega-3 Fatty Acids (FISH OIL PO) [id number] No Take 2,000 mg by mouth in the morning and at bedtime. [provider] Taking Active   omeprazole (PRILOSEC) 40 MG capsule [id number] No Take 40 mg by mouth daily. [provider] Taking Active   QUEtiapine (SEROQUEL) 50 MG tablet [id number] No Take 50 mg by mouth at bedtime. [provider] Taking Active   simvastatin (ZOCOR) 10 MG tablet [id number] No Take 10 mg by mouth daily. [provider] Taking Active Self           Med Note Darrick Grinder, [place] Jul 30, 2016  7:57 AM) Received from: External Pharmacy  spironolactone (ALDACTONE) 50 MG tablet [id number] No Take 50 mg by mouth 2 (two) times daily. [provider] Taking Active Self             SDOH:  (Social Determinants of Health) assessments and interventions performed:  SDOH Interventions    Flowsheet Row Most Recent Value  SDOH Interventions   Food Insecurity Interventions Intervention Not Indicated  Housing Interventions Intervention Not Indicated  Transportation Interventions Intervention Not Indicated       Care Plan  Review of patient past medical history, allergies, medications, health status,  including review of consultants reports, laboratory and other test data, was performed as part of comprehensive evaluation for care management services.   Care Plan : Diabetes Type 2 (Adult)  Updates made by Verlin Grills, RN since 12/06/2021 12:00 AM     Problem: Glycemic Management (Diabetes, Type 2) Resolved 12/06/2021  Priority: Medium  Onset Date: 02/21/2021  Note:   [id number] Resolving due to duplicate goal     Long-Range Goal: Glycemic Management Optimized Completed 12/06/2021  Start Date:  02/19/2021  Expected End Date: 12/14/2021  Recent Progress: On track  Priority: High  Note:   Evidence-based guidance:  Anticipate A1C testing (point-of-care) every 3 to 6 months based on goal attainment.  Review mutually-set A1C goal or target range.  Anticipate use of antihyperglycemic with or without insulin and periodic adjustments; consider active involvement of pharmacist.  Provide medical nutrition therapy and development of individualized eating.  Compare self-reported symptoms of hypo or hyperglycemia to blood glucose levels, diet and fluid intake, current medications, psychosocial and physiologic stressors, change in activity and barriers to care adherence.  Promote self-monitoring of blood glucose levels.  Assess and address barriers to management plan, such as food insecurity, age, developmental ability, depression, anxiety, fear of hypoglycemia or weight gain, as well as medication cost, side effects and complicated regimen.  Consider referral to community-based diabetes education program, visiting nurse, community health worker or health coach.  Encourage regular dental care for treatment of periodontal disease; refer to dental provider when needed.   Notes:  [id number] Patient A1C is 6.8 slight increase from last A1C 6.5    Task: Alleviate Barriers to Glycemic Management Completed 12/06/2021  Due Date: 12/14/2021  Note:   Care Management Activities:    - blood glucose monitoring encouraged - mutual A1C goal set or reviewed - use of blood glucose monitoring log promoted    Notes:     Problem: Disease Progression (Diabetes, Type 2) Resolved 12/06/2021  Priority: Medium  Onset Date: 02/20/2021  Note:   [id number] Resolving due to duplicate goal     Long-Range Goal: Disease Progression Prevented or Minimized Completed 12/06/2021  Start Date: 02/19/2021  Expected End Date: 12/14/2021  Recent Progress: On track  Priority: Medium  Note:   Evidence-based guidance:  Prepare  patient for laboratory and diagnostic exams based on risk and presentation.  Encourage lifestyle changes, such as increased intake of plant-based foods, stress reduction, consistent physical activity and smoking cessation to prevent long-term complications and chronic disease.   Individualize activity and exercise recommendations while considering potential limitations, such as neuropathy, retinopathy or the ability to prevent hyperglycemia or hypoglycemia.   Prepare patient for use of pharmacologic therapy that may include antihypertensive, analgesic, prostaglandin E1 with periodic adjustments, based on presenting chronic condition and laboratory results.  Assess signs/symptoms and risk factors for hypertension, sleep-disordered breathing, neuropathy (including changes in gait and balance), retinopathy, nephropathy and sexual dysfunction.  Address pregnancy planning and contraceptive choice, especially when prescribing antihypertensive or statin.  Ensure completion of annual comprehensive foot exam and dilated eye exam.   Implement additional individualized goals and interventions based on identified risk factors.  Prepare patient for consultation or referral for specialist care, such as ophthalmology, neurology, cardiology, podiatry, nephrology or perinatology.   Notes:     Task: Monitor and Manage Follow-up for Comorbidities Completed 12/06/2021  Due Date: 12/14/2021  Note:   Care Management Activities:    - healthy lifestyle promoted - reduction of sedentary activity encouraged - signs/symptoms of comorbidities identified  Notes:     Care Plan : RN Care Manager Plan of Care  Updates made by , Eppie Gibson, RN since 12/06/2021 12:00 AM     Problem: Knowledge Deficit Related to Diabetes and Care Coordination Needs   Priority: High     Long-Range Goal: Development Plan of Care for Management of Diabetes   Start Date: 12/03/2021  Expected End Date: 12/14/2022  Priority: High   Note:   Current Barriers:  Knowledge Deficits related to plan of care for management of DMII   RNCM Clinical Goal(s):  Patient will verbalize understanding of plan for management of DMII as evidenced by Continuation of monitoring blood sugars adhering  through collaboration with RN Care manager, provider, and care team.   Interventions: Inter-disciplinary care team collaboration (see longitudinal plan of care) Evaluation of current treatment plan related to  self management and patient's adherence to plan as established by provider   Patient Goals/Self-Care Activities: Take medications as prescribed   Attend all scheduled provider appointments Call pharmacy for medication refills 3-7 days in advance of running out of medications Attend church or other social activities Perform all self care activities independently  Perform IADL's (shopping, preparing meals, housekeeping, managing finances) independently Call provider office for new concerns or questions  call the Suicide and Crisis Lifeline: 988 if experiencing a Mental Health or [place]  schedule appointment with eye doctor check blood sugar at prescribed times: once daily check feet daily for cuts, sores or redness take the blood sugar meter to all doctor visits trim toenails straight across drink 6 to 8 glasses of water each day manage portion size set a realistic goal        Plan: Telephone follow up appointment with care management team member scheduled for:  March 2023 The patient has been provided with contact information for the care management team and has been advised to call with any health related questions or concerns.   [facility] [phone]

## 2021-12-06 NOTE — Patient Instructions (Signed)
Visit Information  Thank you for taking time to visit with me today. Please don't hesitate to contact me if I can be of assistance to you before our next scheduled telephone appointment.  Following are the goals we discussed today:  Take medications as prescribed   Attend all scheduled provider appointments Call pharmacy for medication refills 3-7 days in advance of running out of medications Attend church or other social activities Perform all self care activities independently  Perform IADL's (shopping, preparing meals, housekeeping, managing finances) independently Call provider office for new concerns or questions  call the Suicide and Crisis Lifeline: 988 if experiencing a Mental Health or [place]  schedule appointment with eye doctor check blood sugar at prescribed times: once daily check feet daily for cuts, sores or redness take the blood sugar meter to all doctor visits trim toenails straight across drink 6 to 8 glasses of water each day manage portion size set a realistic goal Continue with your counseling and look at grief counseling  Our next appointment is by telephone on March 2023  Please call the care guide team at [phone] if you need to cancel or reschedule your appointment.   Please call the Suicide and Crisis Lifeline: 988 if you are experiencing a Mental Health or [place] or need someone to talk to.  The patient verbalized understanding of instructions, educational materials, and care plan provided today and agreed to receive a mailed copy of patient instructions, educational materials, and care plan.   Telephone follow up appointment with care management team member scheduled for: The patient has been provided with contact information for the care management team and has been advised to call with any health related questions or concerns.   [facility] [phone]

## 2021-12-13 DIAGNOSIS — H2512 Age-related nuclear cataract, left eye: Secondary | ICD-10-CM | POA: Diagnosis not present

## 2021-12-19 ENCOUNTER — Encounter (HOSPITAL_COMMUNITY)
Admission: RE | Admit: 2021-12-19 | Discharge: 2021-12-19 | Disposition: A | Discharge status: Home / Self Care | Payer: Medicare Other | Source: Ambulatory Visit | Attending: Ophthalmology | Admitting: Ophthalmology

## 2021-12-20 NOTE — H&P (Signed)
Surgical History & Physical  Patient Name: [name]                     DOB: 1960/12/17  Surgery: Cataract extraction with intraocular lens implant phacoemulsification; Left Eye  Surgeon: Baruch Goldmann MD Surgery Date:  12-24-21 Pre-Op Date:  12-13-21  HPI: A 54 Yr. old female patient Pt referred by Dr. Hassell Done for cataract evaluation. The patient complains of nighttime light - car headlights, street lamps etc. glare causing poor vision, which began 3 years ago. Both eyes are affected. The episode is gradual. The condition's severity is worsening. The complaint is associated with blurry vision, glare and halos. Symptoms are negatively affecting pt's quality of life. Pt taking restasis BID OU and systane prn OU. Pt denies any increase in floater/flashes of light. HPI was performed by Baruch Goldmann .  Medical History: Dry Eyes Cataracts [place] (2015) Macula Degeneration Glaucoma Diabetes GERD, Migraine, Depression, Anxiety High Blood Pressure LDL Thyroid Problems  Review of Systems Negative Allergic/Immunologic Negative Cardiovascular Negative Constitutional Negative Ear, Nose, Mouth & Throat Negative Endocrine Negative Eyes Negative Gastrointestinal Negative Genitourinary Negative Hemotologic/Lymphatic Negative Integumentary Negative Musculoskeletal Negative Neurological Negative Psychiatry Negative Respiratory  Social   Former smoker   Medication Restasis, SYSTANE ultra, Metformin, Bupropion HCL, Omeprazole, Beroquel, Simvastatin, Spironolactone, Gabapentin, Levothyroxine Sodium,   Sx/Procedures  None  Drug Allergies   NKDA  History & Physical: Heent: Cataract, Left Eye NECK: supple without bruits LUNGS: lungs clear to auscultation CV: regular rate and rhythm Abdomen: soft and non-tender  Impression & Plan: Assessment: 1.  NUCLEAR SCLEROSIS AGE RELATED; Both Eyes (H25.13) 2.  BLEPHARITIS; Right Upper Lid, Right Lower Lid, Left Upper Lid, Left Lower  Lid (H01.001, H01.002,H01.004,H01.005)  Plan: 1.  Cataract accounts for the patient's decreased vision. This visual impairment is not correctable with a tolerable change in glasses or contact lenses. Cataract surgery with an implantation of a new lens should significantly improve the visual and functional status of the patient. Discussed all risks, benefits, alternatives, and potential complications. Discussed the procedures and recovery. Patient desires to have surgery. A-scan ordered and performed today for intra-ocular lens calculations. The surgery will be performed in order to improve vision for driving, reading, and for eye examinations. Recommend phacoemulsification with intra-ocular lens. Recommend Dextenza for post-operative pain and inflammation. Left Eye worse - first. Dilates well - shugarcaine by protocol.  2.  Recommend regular lid cleaning.

## 2021-12-24 ENCOUNTER — Encounter (HOSPITAL_COMMUNITY): Payer: Self-pay | Admitting: Ophthalmology

## 2021-12-24 ENCOUNTER — Other Ambulatory Visit: Payer: Self-pay

## 2021-12-24 ENCOUNTER — Encounter (HOSPITAL_COMMUNITY)
Admission: RE | Disposition: A | Discharge status: Home / Self Care | Payer: Self-pay | Source: Ambulatory Visit | Attending: Ophthalmology

## 2021-12-24 ENCOUNTER — Ambulatory Visit (HOSPITAL_COMMUNITY): Payer: Medicare Other | Admitting: Anesthesiology

## 2021-12-24 ENCOUNTER — Ambulatory Visit (HOSPITAL_COMMUNITY)
Admission: RE | Admit: 2021-12-24 | Discharge: 2021-12-24 | Disposition: A | Discharge status: Home / Self Care | Payer: Medicare Other | Source: Ambulatory Visit | Attending: Ophthalmology | Admitting: Ophthalmology

## 2021-12-24 DIAGNOSIS — E1136 Type 2 diabetes mellitus with diabetic cataract: Secondary | ICD-10-CM | POA: Diagnosis not present

## 2021-12-24 DIAGNOSIS — Z87891 Personal history of nicotine dependence: Secondary | ICD-10-CM | POA: Insufficient documentation

## 2021-12-24 DIAGNOSIS — H2512 Age-related nuclear cataract, left eye: Secondary | ICD-10-CM | POA: Insufficient documentation

## 2021-12-24 DIAGNOSIS — E039 Hypothyroidism, unspecified: Secondary | ICD-10-CM | POA: Diagnosis not present

## 2021-12-24 HISTORY — PX: CATARACT EXTRACTION W/PHACO: SHX586

## 2021-12-24 LAB — GLUCOSE, CAPILLARY: Glucose-Capillary: 110 mg/dL — ABNORMAL HIGH (ref 70–99)

## 2021-12-24 SURGERY — PHACOEMULSIFICATION, CATARACT, WITH IOL INSERTION
Anesthesia: Monitor Anesthesia Care | Site: Eye | Laterality: Left

## 2021-12-24 MED ORDER — LIDOCAINE HCL 3.5 % OP GEL
1.0000 "application " | Freq: Once | OPHTHALMIC | Status: AC
  Administered 2021-12-24: 1 via OPHTHALMIC

## 2021-12-24 MED ORDER — STERILE WATER FOR IRRIGATION IR SOLN
Status: DC | PRN
  Administered 2021-12-24: 250 mL

## 2021-12-24 MED ORDER — SODIUM HYALURONATE 23MG/ML IO SOSY
PREFILLED_SYRINGE | INTRAOCULAR | Status: DC | PRN
  Administered 2021-12-24: 0.6 mL via INTRAOCULAR

## 2021-12-24 MED ORDER — NEOMYCIN-POLYMYXIN-DEXAMETH 3.5-10000-0.1 OP SUSP
OPHTHALMIC | Status: DC | PRN
  Administered 2021-12-24: 1 [drp] via OPHTHALMIC

## 2021-12-24 MED ORDER — TETRACAINE HCL 0.5 % OP SOLN
1.0000 [drp] | OPHTHALMIC | Status: AC | PRN
  Administered 2021-12-24 (×3): 1 [drp] via OPHTHALMIC

## 2021-12-24 MED ORDER — PHENYLEPHRINE HCL 2.5 % OP SOLN
1.0000 [drp] | OPHTHALMIC | Status: AC | PRN
  Administered 2021-12-24 (×3): 1 [drp] via OPHTHALMIC

## 2021-12-24 MED ORDER — EPINEPHRINE PF 1 MG/ML IJ SOLN
INTRAMUSCULAR | Status: AC
  Filled 2021-12-24: qty 2

## 2021-12-24 MED ORDER — MIDAZOLAM HCL 2 MG/2ML IJ SOLN
INTRAMUSCULAR | Status: DC | PRN
  Administered 2021-12-24: 1 mg via INTRAVENOUS

## 2021-12-24 MED ORDER — EPINEPHRINE PF 1 MG/ML IJ SOLN
INTRAOCULAR | Status: DC | PRN
  Administered 2021-12-24: 500 mL

## 2021-12-24 MED ORDER — DEXAMETHASONE 0.4 MG OP INST
VAGINAL_INSERT | OPHTHALMIC | Status: AC
  Filled 2021-12-24: qty 1

## 2021-12-24 MED ORDER — POVIDONE-IODINE 5 % OP SOLN
OPHTHALMIC | Status: DC | PRN
  Administered 2021-12-24: 1 via OPHTHALMIC

## 2021-12-24 MED ORDER — TROPICAMIDE 1 % OP SOLN
1.0000 [drp] | OPHTHALMIC | Status: AC | PRN
  Administered 2021-12-24 (×3): 1 [drp] via OPHTHALMIC
  Filled 2021-12-24: qty 2

## 2021-12-24 MED ORDER — BSS IO SOLN
INTRAOCULAR | Status: DC | PRN
  Administered 2021-12-24: 15 mL via INTRAOCULAR

## 2021-12-24 MED ORDER — SODIUM HYALURONATE 10 MG/ML IO SOLUTION
PREFILLED_SYRINGE | INTRAOCULAR | Status: DC | PRN
  Administered 2021-12-24: 0.85 mL via INTRAOCULAR

## 2021-12-24 MED ORDER — MIDAZOLAM HCL 2 MG/2ML IJ SOLN
INTRAMUSCULAR | Status: AC
  Filled 2021-12-24: qty 2

## 2021-12-24 MED ORDER — LIDOCAINE HCL (PF) 1 % IJ SOLN
INTRAOCULAR | Status: DC | PRN
  Administered 2021-12-24: 1 mL via OPHTHALMIC

## 2021-12-24 SURGICAL SUPPLY — 14 items
CATARACT SUITE SIGHTPATH (MISCELLANEOUS) ×2 IMPLANT
CLOTH BEACON ORANGE TIMEOUT ST (SAFETY) ×1 IMPLANT
EYE SHIELD UNIVERSAL CLEAR (GAUZE/BANDAGES/DRESSINGS) ×1 IMPLANT
FEE CATARACT SUITE SIGHTPATH (MISCELLANEOUS) IMPLANT
GLOVE SURG UNDER POLY LF SZ7 (GLOVE) ×2 IMPLANT
LENS IOL RAYNER 21.5 (Intraocular Lens) ×2 IMPLANT
LENS IOL RAYONE EMV 21.5 (Intraocular Lens) IMPLANT
NDL HYPO 18GX1.5 BLUNT FILL (NEEDLE) IMPLANT
NEEDLE HYPO 18GX1.5 BLUNT FILL (NEEDLE) ×2 IMPLANT
PAD ARMBOARD 7.5X6 YLW CONV (MISCELLANEOUS) ×1 IMPLANT
SYR TB 1ML LL NO SAFETY (SYRINGE) ×1 IMPLANT
TAPE SURG TRANSPORE 1 IN (GAUZE/BANDAGES/DRESSINGS) IMPLANT
TAPE SURGICAL TRANSPORE 1 IN (GAUZE/BANDAGES/DRESSINGS) ×2
WATER STERILE IRR 250ML POUR (IV SOLUTION) ×1 IMPLANT

## 2021-12-24 NOTE — Transfer of Care (Signed)
Immediate Anesthesia Transfer of Care Note  Patient: [name]  Procedure(s) Performed: CATARACT EXTRACTION PHACO AND INTRAOCULAR LENS PLACEMENT (IOC) (Left: Eye)  Patient Location: PACU  Anesthesia Type:General  Level of Consciousness: awake, alert  and oriented  Airway & Oxygen Therapy: Patient Spontanous Breathing  Post-op Assessment: Report given to RN and Post -op Vital signs reviewed and stable  Post vital signs: Reviewed and stable  Last Vitals:  Vitals Value Taken Time  BP    Temp    Pulse    Resp    SpO2      Last Pain:  Vitals:   12/24/21 1112  TempSrc: Oral  PainSc:          Complications: No notable events documented.

## 2021-12-24 NOTE — Interval H&P Note (Signed)
History and Physical Interval Note:  12/24/2021 11:27 AM  [name]  has presented today for surgery, with the diagnosis of nuclear sclerosis age related cataract, left eye.  The various methods of treatment have been discussed with the patient and family. After consideration of risks, benefits and other options for treatment, the patient has consented to  Procedure(s) with comments: CATARACT EXTRACTION PHACO AND INTRAOCULAR LENS PLACEMENT ([place]) (Left) - left as a surgical intervention.  The patient's history has been reviewed, patient examined, no change in status, stable for surgery.  I have reviewed the patient's chart and labs.  Questions were answered to the patient's satisfaction.     Baruch Goldmann

## 2021-12-24 NOTE — Anesthesia Preprocedure Evaluation (Signed)
Anesthesia Evaluation  Patient identified by MRN, date of birth, ID band Patient awake    Reviewed: Allergy & Precautions, H&P , NPO status , Patient's Chart, lab work & pertinent test results, reviewed documented beta blocker date and time   Airway Mallampati: II  TM Distance: >3 FB Neck ROM: full    Dental no notable dental hx.    Pulmonary asthma , former smoker,    Pulmonary exam normal breath sounds clear to auscultation       Cardiovascular Exercise Tolerance: Good hypertension, negative cardio ROS   Rhythm:regular Rate:Normal     Neuro/Psych  Headaches, PSYCHIATRIC DISORDERS Anxiety Depression  Neuromuscular disease    GI/Hepatic Neg liver ROS, hiatal hernia, GERD  Medicated,  Endo/Other  Hypothyroidism   Renal/GU negative Renal ROS  negative genitourinary   Musculoskeletal   Abdominal   Peds  Hematology  (+) Blood dyscrasia, anemia ,   Anesthesia Other Findings   Reproductive/Obstetrics negative OB ROS                             Anesthesia Physical Anesthesia Plan  ASA: 3  Anesthesia Plan: MAC   Post-op Pain Management:    Induction:   PONV Risk Score and Plan:   Airway Management Planned:   Additional Equipment:   Intra-op Plan:   Post-operative Plan:   Informed Consent: I have reviewed the patients History and Physical, chart, labs and discussed the procedure including the risks, benefits and alternatives for the proposed anesthesia with the patient or authorized representative who has indicated his/her understanding and acceptance.     Dental Advisory Given  Plan Discussed with: CRNA  Anesthesia Plan Comments:         Anesthesia Quick Evaluation

## 2021-12-24 NOTE — Anesthesia Postprocedure Evaluation (Signed)
Anesthesia Post Note  Patient: [name]  Procedure(s) Performed: CATARACT EXTRACTION PHACO AND INTRAOCULAR LENS PLACEMENT (IOC) (Left: Eye)  Patient location during evaluation: Phase II Anesthesia Type: MAC Level of consciousness: awake Pain management: pain level controlled Vital Signs Assessment: post-procedure vital signs reviewed and stable Respiratory status: spontaneous breathing and respiratory function stable Cardiovascular status: blood pressure returned to baseline and stable Postop Assessment: no headache and no apparent nausea or vomiting Anesthetic complications: no Comments: Late entry   No notable events documented.   Last Vitals:  Vitals:   12/24/21 1112 12/24/21 1155  BP: 106/83 98/76  Pulse: 73 69  Resp: 19 18  Temp: 36.7 C 36.6 C  SpO2: 98% 98%    Last Pain:  Vitals:   12/24/21 1155  TempSrc: Oral  PainSc: 0-No pain                 Louann Sjogren

## 2021-12-24 NOTE — Discharge Instructions (Addendum)
Please discharge patient when stable, will follow up today with Dr.  at the [facility] [place] office immediately following discharge.  Leave shield in place until visit.  All paperwork with discharge instructions will be given at the office.  [facility] Address:  [address]  [place], [place] [address]  

## 2021-12-24 NOTE — Op Note (Signed)
Date of procedure: 12/24/21  Pre-operative diagnosis: Visually significant age-related nuclear cataract, Left Eye (H25.12)  Post-operative diagnosis: Visually significant age-related nuclear cataract, Left Eye  Procedure: Removal of cataract via phacoemulsification and insertion of intra-ocular lens Rayner [id number] +21.5D into the capsular bag of the Left Eye  Attending surgeon: Gerda Diss. , MD, MA  Anesthesia: MAC, Topical [place]  Complications: None  Estimated Blood Loss: <64m (minimal)  Specimens: None  Implants: As above  Indications:  Visually significant age-related cataract, Left Eye  Procedure:  The patient was seen and identified in the pre-operative area. The operative eye was identified and dilated.  The operative eye was marked.  Topical anesthesia was administered to the operative eye.     The patient was then to the operative suite and placed in the supine position.  A timeout was performed confirming the patient, procedure to be performed, and all other relevant information.   The patient's face was prepped and draped in the usual fashion for intra-ocular surgery.  A lid speculum was placed into the operative eye and the surgical microscope moved into place and focused.  An inferotemporal paracentesis was created using a 20 gauge paracentesis blade.  Shugarcaine was injected into the anterior chamber.  Viscoelastic was injected into the anterior chamber.  A temporal clear-corneal main wound incision was created using a 2.411mmicrokeratome.  A continuous curvilinear capsulorrhexis was initiated using an irrigating cystitome and completed using capsulorrhexis forceps.  Hydrodissection and hydrodeliniation were performed.  Viscoelastic was injected into the anterior chamber.  A phacoemulsification handpiece and a chopper as a second instrument were used to remove the nucleus and epinucleus. The irrigation/aspiration handpiece was used to remove any remaining cortical material.    The capsular bag was reinflated with viscoelastic, checked, and found to be intact.  The intraocular lens was inserted into the capsular bag.  The irrigation/aspiration handpiece was used to remove any remaining viscoelastic.  The clear corneal wound and paracentesis wounds were then hydrated and checked with Weck-Cels to be watertight.  The lid-speculum and drape was removed, and the patient's face was cleaned with a wet and dry 4x4.  Maxitrol was instilled in the eye. A clear shield was taped over the eye. The patient was taken to the post-operative care unit in good condition, having tolerated the procedure well.  Post-Op Instructions: [facility] and will receive all other orders and instructions.

## 2021-12-25 ENCOUNTER — Encounter (HOSPITAL_COMMUNITY): Payer: Self-pay | Admitting: Ophthalmology

## 2021-12-26 ENCOUNTER — Ambulatory Visit (INDEPENDENT_AMBULATORY_CARE_PROVIDER_SITE_OTHER): Payer: Medicare Other | Admitting: Clinical

## 2021-12-26 ENCOUNTER — Other Ambulatory Visit: Payer: Self-pay

## 2021-12-26 DIAGNOSIS — F331 Major depressive disorder, recurrent, moderate: Secondary | ICD-10-CM

## 2021-12-26 NOTE — Progress Notes (Signed)
Virtual Visit via Telephone Note   I connected with [name] on 12/26/21 at  3:00 PM EDT by telephone and verified that I am speaking with the correct person using two identifiers.   Location: Patient: Home Provider: Office   I discussed the limitations, risks, security and privacy concerns of performing an evaluation and management service by telephone and the availability of in person appointments. I also discussed with the patient that there may be a patient responsible charge related to this service. The patient expressed understanding and agreed to proceed.     THERAPY PROGRESS NOTE   Session Time: 3:00 PM-3:30 PM   Participation Level: Active   Behavioral Response: CasualAlertDepressed   Type of Therapy: Individual Therapy   Treatment Goals addressed: Anger and Coping   Interventions: CBT, Motivational Interviewing, Solution Focused and Strength-based   Summary: [name] is a 61 y.o. female who presents with Depression . The OPT therapist worked with the patient for her ongoing OPT treatment. The OPT therapist utilized Motivational Interviewing to assist in creating therapeutic repore. The patient in the session was engaged and work in collaboration giving feedback about her triggers and symptoms over the past few weeks including recovery from recent cataracts surgery and ongoing adjusting to recently losing her husband in November. The patient reviewed the events including areas of difficulty in adjusting to the loss of her husband. The OPT therapist utilized Cognitive Behavioral Therapy through cognitive restructuring as well as worked with the patient on coping strategies to assist in management of mood including validating the patients feelings and emotions with in moving forward post her late husband passing.  The OPT therapist continued to work with the patient on self check ins and making sure she is addressing her own basic needs. The OPT therapist worked with  the patient providing support and psycho-education. The OPT therapist worked with the patient on the grieving process and normalizing her range of emotions.The OPT therapist continued to work with the patient on maintaining her focus on her mental health.    Suicidal/Homicidal: Nowithout intent/plan   Therapist Response: The OPT therapist worked with the patient for the patients scheduled session. The patient was engaged in her session and gave feedback in relation to triggers, symptoms, and behavior responses over the past few weeks. The OPT therapist worked with the patient utilizing an in session Cognitive Behavioral Therapy exercise. The patient was responsive in the session and verbalized, " I have been working on all the business things that have to be changes so applying for the widows benefits, changing my insurance beneficiary, getting the automobile switched over to my name that has been keeping me busy and I am feeling like hopefully finally I have gotten everything that has to be changed done". The patient continued to verbalized her understanding on the need to keep her support team close as she adjusts to transitioning post her husbands passing. The patient remains active in her involvement with her church and this continues to serve as a coping/protective factor.The OPT therapist will continue treatment work with the patient in her next scheduled session    Plan: Return again in 2/3 weeks.   Diagnosis:      Axis I: Major Depressive Disorder, Recurrent, Moderate                           Axis II: No diagnosis   I discussed the assessment and treatment plan with the patient.  The patient was provided an opportunity to ask questions and all were answered. The patient agreed with the plan and demonstrated an understanding of the instructions.   The patient was advised to call back or seek an in-person evaluation if the symptoms worsen or if the condition fails to improve as anticipated.   I  provided 30 minutes of non-face-to-face time during this encounter.   [name], LCSW   12/26/2021

## 2021-12-27 DIAGNOSIS — H2511 Age-related nuclear cataract, right eye: Secondary | ICD-10-CM | POA: Diagnosis not present

## 2021-12-31 NOTE — H&P (Signed)
Surgical History & Physical  Patient Name: [name] DOB: March 26, 1961  Surgery: Cataract extraction with intraocular lens implant phacoemulsification; Right Eye  Surgeon: Baruch Goldmann MD Surgery Date:  01-07-22 Pre-Op Date:  01-04-22  HPI: A 55 Yr. old female patient The patient is returning after cataract surgery. The left eye is affected. Status post cataract surgery, which began 3 days ago: 12/24/21. Since the last visit, the affected area is doing well. The patient's vision is improved. Patient is following medication instructions.Pt taking PO combo drops TID OS. Pt denies any eye pain or increase in floaters/flashes of light. The patient complains of nighttime light - car headlights, street lamps etc. glare causing poor vision, which began 3 years ago. The right eye is affected. The episode is gradual. The condition's severity is worsening. The complaint is associated with blurry vision, glare and halos.This is negatively affecting the patient's quality of life and the patient is unable to function adequately in life with the current level of vision. HPI was performed by Baruch Goldmann   Medical History: Dry Eyes Cataracts [place] (2015) Macula Degeneration Glaucoma General Health Review of Systems Diabetes GERD, Migraine, Depression, Anxiety High Blood Pressure LDL Thyroid Problems Negative Allergic/Immunologic Negative Cardiovascular Negative Constitutional Negative Ear, Nose, Mouth & Throat Negative Endocrine Negative Eyes Negative Gastrointestinal Negative Genitourinary Negative Hemotologic/Lymphatic Negative Integumentary Negative Musculoskeletal Negative Neurological Negative Psychiatry Negative Respiratory  Social   Former smoker   Medication Restasis, SYSTANE ultra, Prednisolone-Moxifloxacin-Bromfenac,  Metformin, Bupropion HCL, Omeprazole, Beroquel, Simvastatin, Spironolactone, Gabapentin, Levothyroxine Sodium,   Sx/Procedures Phaco c IOL OS,   Drug  Allergies   NKDA  History & Physical: Heent: Cataract, Right Eye NECK: supple without bruits LUNGS: lungs clear to auscultation CV: regular rate and rhythm Abdomen: soft and non-tender  Impression & Plan: Assessment: 1.  CATARACT EXTRACTION STATUS; Left Eye (Z98.42) 2.  NUCLEAR SCLEROSIS AGE RELATED; , Right Eye (H25.11) 3.  Presbyopia ; Both Eyes (H52.4) 4.  ARCUS SENILIS; Both Eyes (H18.413)  Plan: 1.  3 days after cataract surgery. Doing well with improved vision and normal eye pressure. Call with any problems or concerns. Continue Pred-Moxi-Brom 2x/day for 4 more weeks.  2.  Cataract accounts for the patient's decreased vision. This visual impairment is not correctable with a tolerable change in glasses or contact lenses. Cataract surgery with an implantation of a new lens should significantly improve the visual and functional status of the patient. Discussed all risks, benefits, alternatives, and potential complications. Discussed the procedures and recovery. Patient desires to have surgery. A-scan ordered and performed today for intra-ocular lens calculations. The surgery will be performed in order to improve vision for driving, reading, and for eye examinations. Recommend phacoemulsification with intra-ocular lens. Recommend Dextenza for post-operative pain and inflammation. Right Eye. Surgery required to correct imbalance of vision. Dilates well - shugarcaine by protocol.  3.   4.  Discussed significance of finding Answered patient questions about finding

## 2022-01-02 ENCOUNTER — Other Ambulatory Visit: Payer: Self-pay

## 2022-01-02 ENCOUNTER — Encounter (HOSPITAL_COMMUNITY)
Admission: RE | Admit: 2022-01-02 | Discharge: 2022-01-02 | Disposition: A | Discharge status: Home / Self Care | Payer: Medicare Other | Source: Ambulatory Visit | Attending: Ophthalmology | Admitting: Ophthalmology

## 2022-01-02 ENCOUNTER — Encounter (HOSPITAL_COMMUNITY): Payer: Self-pay

## 2022-01-02 NOTE — Pre-Procedure Instructions (Signed)
Patients VM is full on phone. I left a message with her sister, Jeanett Schlein, who drove her home last week, to have patient call so we can go over her pre-op information.

## 2022-01-03 DIAGNOSIS — G43919 Migraine, unspecified, intractable, without status migrainosus: Secondary | ICD-10-CM | POA: Diagnosis not present

## 2022-01-07 ENCOUNTER — Ambulatory Visit (HOSPITAL_COMMUNITY)
Admission: RE | Admit: 2022-01-07 | Discharge: 2022-01-07 | Disposition: A | Discharge status: Home / Self Care | Payer: Medicare Other | Source: Ambulatory Visit | Attending: Ophthalmology | Admitting: Ophthalmology

## 2022-01-07 ENCOUNTER — Ambulatory Visit (HOSPITAL_COMMUNITY): Payer: Medicare Other | Admitting: Anesthesiology

## 2022-01-07 ENCOUNTER — Encounter (HOSPITAL_COMMUNITY): Payer: Self-pay | Admitting: Ophthalmology

## 2022-01-07 ENCOUNTER — Encounter (HOSPITAL_COMMUNITY)
Admission: RE | Disposition: A | Discharge status: Home / Self Care | Payer: Self-pay | Source: Ambulatory Visit | Attending: Ophthalmology

## 2022-01-07 ENCOUNTER — Other Ambulatory Visit: Payer: Self-pay

## 2022-01-07 DIAGNOSIS — K219 Gastro-esophageal reflux disease without esophagitis: Secondary | ICD-10-CM | POA: Diagnosis not present

## 2022-01-07 DIAGNOSIS — E1136 Type 2 diabetes mellitus with diabetic cataract: Secondary | ICD-10-CM | POA: Diagnosis not present

## 2022-01-07 DIAGNOSIS — J45909 Unspecified asthma, uncomplicated: Secondary | ICD-10-CM | POA: Insufficient documentation

## 2022-01-07 DIAGNOSIS — H524 Presbyopia: Secondary | ICD-10-CM | POA: Insufficient documentation

## 2022-01-07 DIAGNOSIS — Z87891 Personal history of nicotine dependence: Secondary | ICD-10-CM | POA: Insufficient documentation

## 2022-01-07 DIAGNOSIS — E039 Hypothyroidism, unspecified: Secondary | ICD-10-CM | POA: Diagnosis not present

## 2022-01-07 DIAGNOSIS — F419 Anxiety disorder, unspecified: Secondary | ICD-10-CM | POA: Diagnosis not present

## 2022-01-07 DIAGNOSIS — H18413 Arcus senilis, bilateral: Secondary | ICD-10-CM | POA: Diagnosis not present

## 2022-01-07 DIAGNOSIS — I10 Essential (primary) hypertension: Secondary | ICD-10-CM | POA: Insufficient documentation

## 2022-01-07 DIAGNOSIS — H2511 Age-related nuclear cataract, right eye: Secondary | ICD-10-CM | POA: Insufficient documentation

## 2022-01-07 DIAGNOSIS — Z9842 Cataract extraction status, left eye: Secondary | ICD-10-CM | POA: Diagnosis not present

## 2022-01-07 DIAGNOSIS — H25811 Combined forms of age-related cataract, right eye: Secondary | ICD-10-CM | POA: Diagnosis not present

## 2022-01-07 DIAGNOSIS — F32A Depression, unspecified: Secondary | ICD-10-CM | POA: Insufficient documentation

## 2022-01-07 HISTORY — PX: CATARACT EXTRACTION W/PHACO: SHX586

## 2022-01-07 LAB — GLUCOSE, CAPILLARY: Glucose-Capillary: 120 mg/dL — ABNORMAL HIGH (ref 70–99)

## 2022-01-07 SURGERY — PHACOEMULSIFICATION, CATARACT, WITH IOL INSERTION
Anesthesia: General | Site: Eye | Laterality: Right

## 2022-01-07 MED ORDER — PHENYLEPHRINE HCL 2.5 % OP SOLN
1.0000 [drp] | OPHTHALMIC | Status: AC | PRN
  Administered 2022-01-07 (×3): 1 [drp] via OPHTHALMIC

## 2022-01-07 MED ORDER — MIDAZOLAM HCL 5 MG/5ML IJ SOLN
INTRAMUSCULAR | Status: DC | PRN
  Administered 2022-01-07: 1 mg via INTRAVENOUS

## 2022-01-07 MED ORDER — STERILE WATER FOR IRRIGATION IR SOLN
Status: DC | PRN
  Administered 2022-01-07: 250 mL

## 2022-01-07 MED ORDER — NEOMYCIN-POLYMYXIN-DEXAMETH 3.5-10000-0.1 OP SUSP
OPHTHALMIC | Status: DC | PRN
  Administered 2022-01-07: 1 [drp] via OPHTHALMIC

## 2022-01-07 MED ORDER — EPINEPHRINE PF 1 MG/ML IJ SOLN
INTRAOCULAR | Status: DC | PRN
  Administered 2022-01-07: 500 mL

## 2022-01-07 MED ORDER — MIDAZOLAM HCL 2 MG/2ML IJ SOLN
INTRAMUSCULAR | Status: AC
  Filled 2022-01-07: qty 2

## 2022-01-07 MED ORDER — SODIUM HYALURONATE 10 MG/ML IO SOLUTION
PREFILLED_SYRINGE | INTRAOCULAR | Status: DC | PRN
  Administered 2022-01-07: 0.85 mL via INTRAOCULAR

## 2022-01-07 MED ORDER — LIDOCAINE HCL (PF) 1 % IJ SOLN
INTRAOCULAR | Status: DC | PRN
  Administered 2022-01-07: 1 mL via OPHTHALMIC

## 2022-01-07 MED ORDER — TROPICAMIDE 1 % OP SOLN
1.0000 [drp] | OPHTHALMIC | Status: AC | PRN
  Administered 2022-01-07 (×3): 1 [drp] via OPHTHALMIC
  Filled 2022-01-07: qty 2

## 2022-01-07 MED ORDER — SODIUM HYALURONATE 23MG/ML IO SOSY
PREFILLED_SYRINGE | INTRAOCULAR | Status: DC | PRN
  Administered 2022-01-07: 0.6 mL via INTRAOCULAR

## 2022-01-07 MED ORDER — TETRACAINE HCL 0.5 % OP SOLN
1.0000 [drp] | OPHTHALMIC | Status: AC | PRN
  Administered 2022-01-07 (×3): 1 [drp] via OPHTHALMIC

## 2022-01-07 MED ORDER — LIDOCAINE HCL 3.5 % OP GEL
1.0000 "application " | Freq: Once | OPHTHALMIC | Status: AC
  Administered 2022-01-07: 1 via OPHTHALMIC

## 2022-01-07 MED ORDER — BSS IO SOLN
INTRAOCULAR | Status: DC | PRN
  Administered 2022-01-07: 15 mL via INTRAOCULAR

## 2022-01-07 MED ORDER — POVIDONE-IODINE 5 % OP SOLN
OPHTHALMIC | Status: DC | PRN
  Administered 2022-01-07: 1 via OPHTHALMIC

## 2022-01-07 SURGICAL SUPPLY — 14 items
CATARACT SUITE SIGHTPATH (MISCELLANEOUS) ×3 IMPLANT
CLOTH BEACON ORANGE TIMEOUT ST (SAFETY) ×3 IMPLANT
EYE SHIELD UNIVERSAL CLEAR (GAUZE/BANDAGES/DRESSINGS) ×2 IMPLANT
FEE CATARACT SUITE SIGHTPATH (MISCELLANEOUS) ×1 IMPLANT
GLOVE SURG UNDER POLY LF SZ7 (GLOVE) ×4 IMPLANT
LENS IOL RAYNER 21.0 (Intraocular Lens) ×3 IMPLANT
LENS IOL RAYONE EMV 21.0 (Intraocular Lens) IMPLANT
NDL HYPO 18GX1.5 BLUNT FILL (NEEDLE) ×1 IMPLANT
NEEDLE HYPO 18GX1.5 BLUNT FILL (NEEDLE) ×3 IMPLANT
PAD ARMBOARD 7.5X6 YLW CONV (MISCELLANEOUS) ×3 IMPLANT
SYR TB 1ML LL NO SAFETY (SYRINGE) ×3 IMPLANT
TAPE SURG TRANSPORE 1 IN (GAUZE/BANDAGES/DRESSINGS) IMPLANT
TAPE SURGICAL TRANSPORE 1 IN (GAUZE/BANDAGES/DRESSINGS) ×3
WATER STERILE IRR 250ML POUR (IV SOLUTION) ×3 IMPLANT

## 2022-01-07 NOTE — Op Note (Signed)
Date of procedure: 01/07/22  Pre-operative diagnosis: Visually significant age-related nuclear cataract, Right Eye (H25.11)  Post-operative diagnosis: Visually significant age-related nuclear cataract, Right Eye  Procedure: Removal of cataract via phacoemulsification and insertion of intra-ocular lens Rayner [id number] +21.0D into the capsular bag of the Right Eye  Attending surgeon: Gerda Diss. , MD, MA  Anesthesia: MAC, Topical Akten  Complications: None  Estimated Blood Loss: <52m (minimal)  Specimens: None  Implants: As above  Indications:  Visually significant age-related cataract, Right Eye  Procedure:  The patient was seen and identified in the pre-operative area. The operative eye was identified and dilated.  The operative eye was marked.  Topical anesthesia was administered to the operative eye.     The patient was then to the operative suite and placed in the supine position.  A timeout was performed confirming the patient, procedure to be performed, and all other relevant information.   The patient's face was prepped and draped in the usual fashion for intra-ocular surgery.  A lid speculum was placed into the operative eye and the surgical microscope moved into place and focused.  A superotemporal paracentesis was created using a 20 gauge paracentesis blade.  Shugarcaine was injected into the anterior chamber.  Viscoelastic was injected into the anterior chamber.  A temporal clear-corneal main wound incision was created using a 2.427mmicrokeratome.  A continuous curvilinear capsulorrhexis was initiated using an irrigating cystitome and completed using capsulorrhexis forceps.  Hydrodissection and hydrodeliniation were performed.  Viscoelastic was injected into the anterior chamber.  A phacoemulsification handpiece and a chopper as a second instrument were used to remove the nucleus and epinucleus. The irrigation/aspiration handpiece was used to remove any remaining cortical  material.   The capsular bag was reinflated with viscoelastic, checked, and found to be intact.  The intraocular lens was inserted into the capsular bag.  The irrigation/aspiration handpiece was used to remove any remaining viscoelastic.  The clear corneal wound and paracentesis wounds were then hydrated and checked with Weck-Cels to be watertight.  The lid-speculum and drape was removed, and the patient's face was cleaned with a wet and dry 4x4.  Maxitrol was instilled in the eye. A clear shield was taped over the eye. The patient was taken to the post-operative care unit in good condition, having tolerated the procedure well.  Post-Op Instructions: [facility] and will receive all other orders and instructions.

## 2022-01-07 NOTE — Interval H&P Note (Signed)
History and Physical Interval Note:  01/07/2022 9:13 AM  [name]  has presented today for surgery, with the diagnosis of nuclear sclerosis age related; right eye.  The various methods of treatment have been discussed with the patient and family. After consideration of risks, benefits and other options for treatment, the patient has consented to  Procedure(s) with comments: CATARACT EXTRACTION PHACO AND INTRAOCULAR LENS PLACEMENT (IOC) (Right) - CDE:  as a surgical intervention.  The patient's history has been reviewed, patient examined, no change in status, stable for surgery.  I have reviewed the patient's chart and labs.  Questions were answered to the patient's satisfaction.     Baruch Goldmann

## 2022-01-07 NOTE — Transfer of Care (Signed)
Immediate Anesthesia Transfer of Care Note  Patient: [name]  Procedure(s) Performed: CATARACT EXTRACTION PHACO AND INTRAOCULAR LENS PLACEMENT (IOC) (Right: Eye)  Patient Location: Short Stay  Anesthesia Type:MAC  Level of Consciousness: awake, alert , oriented and patient cooperative  Airway & Oxygen Therapy: Patient Spontanous Breathing  Post-op Assessment: Report given to RN, Post -op Vital signs reviewed and stable and Patient moving all extremities  Post vital signs: Reviewed and stable  Last Vitals:  Vitals Value Taken Time  BP    Temp    Pulse    Resp    SpO2      Last Pain:  Vitals:   01/07/22 0810  TempSrc: Oral  PainSc: 0-No pain         Complications: No notable events documented.

## 2022-01-07 NOTE — Anesthesia Preprocedure Evaluation (Signed)
Anesthesia Evaluation  Patient identified by MRN, date of birth, ID band Patient awake    Reviewed: Allergy & Precautions, H&P , NPO status , Patient's Chart, lab work & pertinent test results, reviewed documented beta blocker date and time   Airway Mallampati: II  TM Distance: >3 FB Neck ROM: full    Dental no notable dental hx.    Pulmonary shortness of breath, asthma , former smoker,    Pulmonary exam normal breath sounds clear to auscultation       Cardiovascular Exercise Tolerance: Good hypertension, negative cardio ROS   Rhythm:regular Rate:Normal     Neuro/Psych  Headaches, PSYCHIATRIC DISORDERS Anxiety Depression  Neuromuscular disease    GI/Hepatic Neg liver ROS, hiatal hernia, GERD  Medicated,  Endo/Other  Hypothyroidism   Renal/GU negative Renal ROS  negative genitourinary   Musculoskeletal   Abdominal   Peds  Hematology  (+) Blood dyscrasia, anemia ,   Anesthesia Other Findings   Reproductive/Obstetrics negative OB ROS                             Anesthesia Physical  Anesthesia Plan  ASA: 2  Anesthesia Plan: General   Post-op Pain Management:    Induction:   PONV Risk Score and Plan:   Airway Management Planned:   Additional Equipment:   Intra-op Plan:   Post-operative Plan:   Informed Consent: I have reviewed the patients History and Physical, chart, labs and discussed the procedure including the risks, benefits and alternatives for the proposed anesthesia with the patient or authorized representative who has indicated his/her understanding and acceptance.     Dental Advisory Given  Plan Discussed with: CRNA  Anesthesia Plan Comments:         Anesthesia Quick Evaluation

## 2022-01-07 NOTE — Anesthesia Postprocedure Evaluation (Signed)
Anesthesia Post Note  Patient: [name]  Procedure(s) Performed: CATARACT EXTRACTION PHACO AND INTRAOCULAR LENS PLACEMENT (IOC) (Right: Eye)  Patient location during evaluation: Phase II Anesthesia Type: General Level of consciousness: awake Pain management: pain level controlled Vital Signs Assessment: post-procedure vital signs reviewed and stable Respiratory status: spontaneous breathing and respiratory function stable Cardiovascular status: blood pressure returned to baseline and stable Postop Assessment: no headache and no apparent nausea or vomiting Anesthetic complications: no Comments: Late entry   No notable events documented.   Last Vitals:  Vitals:   01/07/22 0810 01/07/22 0935  BP: 111/82 100/64  Pulse: 61 60  Resp: 14 18  Temp: 36.7 C 36.8 C  SpO2: 99% 100%    Last Pain:  Vitals:   01/07/22 0935  TempSrc: Oral  PainSc: 0-No pain                 Louann Sjogren

## 2022-01-07 NOTE — Discharge Instructions (Addendum)
Please discharge patient when stable, will follow up today with Dr. Wrzosek at the [facility] [place] office immediately following discharge.  Leave shield in place until visit.  All paperwork with discharge instructions will be given at the office.  [facility] Address:  [address]  [place], [place] [address]  

## 2022-01-08 ENCOUNTER — Encounter (HOSPITAL_COMMUNITY): Payer: Self-pay | Admitting: Ophthalmology

## 2022-01-16 ENCOUNTER — Other Ambulatory Visit: Payer: Self-pay

## 2022-01-16 ENCOUNTER — Ambulatory Visit (INDEPENDENT_AMBULATORY_CARE_PROVIDER_SITE_OTHER): Payer: Medicare Other | Admitting: Clinical

## 2022-01-16 DIAGNOSIS — F331 Major depressive disorder, recurrent, moderate: Secondary | ICD-10-CM

## 2022-01-16 NOTE — Progress Notes (Signed)
Virtual Visit via Telephone Note   I connected with [name] on 01/16/22 at  1:00 PM EDT by telephone and verified that I am speaking with the correct person using two identifiers.   Location: Patient: Home Provider: Office   I discussed the limitations, risks, security and privacy concerns of performing an evaluation and management service by telephone and the availability of in person appointments. I also discussed with the patient that there may be a patient responsible charge related to this service. The patient expressed understanding and agreed to proceed.     THERAPY PROGRESS NOTE   Session Time: 1:00 PM-1:45 PM   Participation Level: Active   Behavioral Response: CasualAlertDepressed   Type of Therapy: Individual Therapy   Treatment Goals addressed: Anger and Coping   Interventions: CBT, Motivational Interviewing, Solution Focused and Strength-based   Summary: [name]. Gethers is a 61 y.o. female who presents with Depression . The OPT therapist worked with the patient for her ongoing OPT treatment. The OPT therapist utilized Motivational Interviewing to assist in creating therapeutic repore. The patient in the session was engaged and work in collaboration giving feedback about her triggers and symptoms over the past few weeks including ongoing difficulty in adjusting to the loss of her husband. The OPT therapist utilized Cognitive Behavioral Therapy through cognitive restructuring as well as worked with the patient on coping strategies to assist in management of mood including validating the patients feelings and emotions with in moving forward post her late husband passing.  The OPT therapist continued to work with the patient on self check ins and making sure she is addressing her own basic needs. The OPT therapist worked with the patient providing support and psycho-education. The OPT therapist worked with the patient on the grieving process and normalizing her range of  emotions.The OPT therapist continued to work with the patient on maintaining her focus on her mental health.    Suicidal/Homicidal: Nowithout intent/plan   Therapist Response: The OPT therapist worked with the patient for the patients scheduled session. The patient was engaged in her session and gave feedback in relation to triggers, symptoms, and behavior responses over the past few weeks. The OPT therapist worked with the patient utilizing an in session Cognitive Behavioral Therapy exercise. The patient was responsive in the session and verbalized, " I have been staying so busy , but lately its been hitting me hard and been harder to function". The patient continued to verbalized her understanding on the need to keep her support team close as she adjusts to transitioning post her husbands passing. The patient remains active in her involvement with her church and this continues to serve as a coping/protective factor.The OPT therapist will continue treatment work with the patient in her next scheduled session    Plan: Return again in 2/3 weeks.   Diagnosis:      Axis I: Major Depressive Disorder, Recurrent, Moderate                           Axis II: No diagnosis   I discussed the assessment and treatment plan with the patient. The patient was provided an opportunity to ask questions and all were answered. The patient agreed with the plan and demonstrated an understanding of the instructions.   The patient was advised to call back or seek an in-person evaluation if the symptoms worsen or if the condition fails to improve as anticipated.   I provided 45  minutes of non-face-to-face time during this encounter.   Stacey Grumbles, LCSW   01/16/2022

## 2022-02-11 ENCOUNTER — Other Ambulatory Visit: Payer: Self-pay

## 2022-02-11 ENCOUNTER — Ambulatory Visit (INDEPENDENT_AMBULATORY_CARE_PROVIDER_SITE_OTHER): Payer: Medicare Other | Admitting: Clinical

## 2022-02-11 DIAGNOSIS — F331 Major depressive disorder, recurrent, moderate: Secondary | ICD-10-CM

## 2022-02-11 NOTE — Progress Notes (Signed)
Virtual Visit via Telephone Note   I connected with [name] on 02/11/22 at  1:00 PM EDT by telephone and verified that I am speaking with the correct person using two identifiers.   Location: Patient: Home Provider: Office   I discussed the limitations, risks, security and privacy concerns of performing an evaluation and management service by telephone and the availability of in person appointments. I also discussed with the patient that there may be a patient responsible charge related to this service. The patient expressed understanding and agreed to proceed.     THERAPY PROGRESS NOTE   Session Time: 1:00 PM-1:30 PM   Participation Level: Active   Behavioral Response: CasualAlertDepressed   Type of Therapy: Individual Therapy   Treatment Goals addressed: Anger and Coping   Interventions: CBT, Motivational Interviewing, Solution Focused and Strength-based   Summary: [name] is a 61 y.o. female who presents with Depression . The OPT therapist worked with the patient for her ongoing OPT treatment. The OPT therapist utilized Motivational Interviewing to assist in creating therapeutic repore. The patient in the session was engaged and work in collaboration giving feedback about her triggers and symptoms over the past few weeks including ongoing difficulty in adjusting to the loss of her husband and reacclimating to being a widow. The OPT therapist utilized Cognitive Behavioral Therapy through cognitive restructuring as well as worked with the patient on coping strategies to assist in management of mood including validating the patients feelings and emotions with in moving forward post her late husband passing.  The OPT therapist continued to work with the patient on self check ins and making sure she is addressing her own basic needs. The OPT therapist worked with the patient providing support and psycho-education. The OPT therapist worked with the patient on the grieving  process and normalizing her range of emotions. The patient is going to restart exercising doing water aerobics at the local [employer]. The OPT therapist continued to work with the patient on maintaining her focus on her mental health.    Suicidal/Homicidal: Nowithout intent/plan   Therapist Response: The OPT therapist worked with the patient for the patients scheduled session. The patient was engaged in her session and gave feedback in relation to triggers, symptoms, and behavior responses over the past few weeks. The OPT therapist worked with the patient utilizing an in session Cognitive Behavioral Therapy exercise. The patient was responsive in the session and verbalized, " I have been better then when we last spoke, I am getting refocused and making effort to start to move forward and focus on my future". The patient continued to verbalized her understanding on the need to keep her support team close as she adjusts to transitioning post her husbands passing. The patient remains active in her involvement with her church and this continues to serve as a coping/protective factor as well as verbalizing a commitment to start her exercise routine back up starting later today.The OPT therapist will continue treatment work with the patient in her next scheduled session    Plan: Return again in 2/3 weeks.   Diagnosis:      Axis I: Major Depressive Disorder, Recurrent, Moderate                           Axis II: No diagnosis      Collaboration of Care: No additional collaboration of care for this session.  Patient/Guardian was advised Release of Information must be obtained  prior to any record release in order to collaborate their care with an outside provider. Patient/Guardian was advised if they have not already done so to contact the registration department to sign all necessary forms in order for [place] to release information regarding their care.   Consent: Patient/Guardian gives verbal consent for treatment  and assignment of benefits for services provided during this visit. Patient/Guardian expressed understanding and agreed to proceed.   I discussed the assessment and treatment plan with the patient. The patient was provided an opportunity to ask questions and all were answered. The patient agreed with the plan and demonstrated an understanding of the instructions.   The patient was advised to call back or seek an in-person evaluation if the symptoms worsen or if the condition fails to improve as anticipated.   I provided 30 minutes of non-face-to-face time during this encounter.   [name], LCSW   02/11/2022

## 2022-03-04 ENCOUNTER — Ambulatory Visit (HOSPITAL_COMMUNITY): Payer: Medicare Other | Admitting: Clinical

## 2022-03-04 ENCOUNTER — Other Ambulatory Visit: Payer: Self-pay

## 2022-03-04 ENCOUNTER — Telehealth (HOSPITAL_COMMUNITY): Payer: Self-pay | Admitting: Clinical

## 2022-03-04 NOTE — Telephone Encounter (Signed)
Pt was not able to make her appointment due to work obligations. she will be calling back in to reschedule. ?

## 2022-03-13 ENCOUNTER — Other Ambulatory Visit: Payer: Self-pay | Admitting: *Deleted

## 2022-03-13 NOTE — Patient Outreach (Signed)
[facility]) Care Management ? ?03/13/2022 ? ?[name] ?04/12/1961 ?[phone] ? ? ?RN Health Coach attempted follow up outreach call to patient.  Patient was unavailable. HIPPA compliance voicemail message left with return callback number. ? ?Plan: ?RN will call patient again within 30 days. ? ?Johny Shock BSN RN ?[employer] [facility] ?[phone] ? ?

## 2022-04-03 ENCOUNTER — Other Ambulatory Visit: Payer: Self-pay | Admitting: *Deleted

## 2022-04-03 NOTE — Patient Outreach (Signed)
[facility]) Care Management ? ?04/03/2022 ? ?[name] ?Oct 31, 1961 ?[id number] ? ? ?RN Health Coach attempted follow up outreach call to patient.  Patient was unavailable. HIPPA compliance voicemail message left with return callback number. ? ?Plan: ?RN will call patient again within 30 days. ? ?Johny Shock BSN RN ?[place] [facility] ?[phone] ? ?

## 2022-04-04 DIAGNOSIS — E1143 Type 2 diabetes mellitus with diabetic autonomic (poly)neuropathy: Secondary | ICD-10-CM | POA: Diagnosis not present

## 2022-04-04 DIAGNOSIS — I10 Essential (primary) hypertension: Secondary | ICD-10-CM | POA: Diagnosis not present

## 2022-04-04 DIAGNOSIS — Z0000 Encounter for general adult medical examination without abnormal findings: Secondary | ICD-10-CM | POA: Diagnosis not present

## 2022-04-04 DIAGNOSIS — G43919 Migraine, unspecified, intractable, without status migrainosus: Secondary | ICD-10-CM | POA: Diagnosis not present

## 2022-04-04 DIAGNOSIS — E032 Hypothyroidism due to medicaments and other exogenous substances: Secondary | ICD-10-CM | POA: Diagnosis not present

## 2022-04-04 DIAGNOSIS — I700 Atherosclerosis of aorta: Secondary | ICD-10-CM | POA: Diagnosis not present

## 2022-04-08 DIAGNOSIS — Z0000 Encounter for general adult medical examination without abnormal findings: Secondary | ICD-10-CM | POA: Diagnosis not present

## 2022-04-08 DIAGNOSIS — E1143 Type 2 diabetes mellitus with diabetic autonomic (poly)neuropathy: Secondary | ICD-10-CM | POA: Diagnosis not present

## 2022-04-08 DIAGNOSIS — I10 Essential (primary) hypertension: Secondary | ICD-10-CM | POA: Diagnosis not present

## 2022-04-08 DIAGNOSIS — I700 Atherosclerosis of aorta: Secondary | ICD-10-CM | POA: Diagnosis not present

## 2022-04-08 DIAGNOSIS — G43919 Migraine, unspecified, intractable, without status migrainosus: Secondary | ICD-10-CM | POA: Diagnosis not present

## 2022-04-15 ENCOUNTER — Other Ambulatory Visit: Payer: Self-pay | Admitting: *Deleted

## 2022-04-15 NOTE — Patient Outreach (Signed)
[facility]) Care Management ? ?04/15/2022 ? ?[name] ?1961-02-08 ?[id number] ? ? ?RN Health Coach attempted follow up outreach call to patient.  Patient was unavailable. HIPPA compliance voicemail message left with return callback number. ? ?Plan: ?RN will call patient again within 30 days. ? ?Johny Shock BSN RN ?[place] Management ?[phone] ? ?

## 2022-04-25 ENCOUNTER — Encounter: Payer: Self-pay | Admitting: Obstetrics & Gynecology

## 2022-04-25 ENCOUNTER — Ambulatory Visit (INDEPENDENT_AMBULATORY_CARE_PROVIDER_SITE_OTHER): Payer: Medicare Other | Admitting: Obstetrics & Gynecology

## 2022-04-25 VITALS — BP 93/65 | HR 72 | Ht 63.0 in | Wt 205.0 lb

## 2022-04-25 DIAGNOSIS — Z01419 Encounter for gynecological examination (general) (routine) without abnormal findings: Secondary | ICD-10-CM

## 2022-04-25 DIAGNOSIS — Z124 Encounter for screening for malignant neoplasm of cervix: Secondary | ICD-10-CM | POA: Diagnosis not present

## 2022-04-25 NOTE — Progress Notes (Signed)
Subjective:  ?  ? [name] is a 61 y.o. female here for a routine exam.  No LMP recorded. Patient has had a hysterectomy. [id number] ?Birth Control Method:  hysterectomy due to prolapse ?Menstrual Calendar(currently): amenorrhea  ?Current complaints: none.  ? ?Current acute medical issues:  none acute ?  ?Recent Gynecologic History ?No LMP recorded. Patient has had a hysterectomy. ?Last Pap: na,   ?Last mammogram: Has her mammograms at [facility],  normal ? ?Past Medical History:  ?Diagnosis Date  ? Anxiety   ? Asthma   ? Chronic back pain   ? Depression   ? Diverticulitis   ? complicated by abscess requiring total colectomy and take-down of colostomy in 2004  ? GERD (gastroesophageal reflux disease)   ? Hemorrhoids   ? Hiatal hernia   ? HTN (hypertension)   ? Hypothyroidism   ? IBS (irritable bowel syndrome)   ? Migraine   ? Neck pain, chronic   ? Obesity   ? Polyp of colon, hyperplastic   ? [facility] spotted fever   ? Terminal ileitis of small intestine ([place])   ? referred to [facility], Crohn's ruled out  ? Thyroid disease   ? ? ?Past Surgical History:  ?Procedure Laterality Date  ? APPENDECTOMY    ? CATARACT EXTRACTION W/PHACO Left 12/24/2021  ? Procedure: CATARACT EXTRACTION PHACO AND INTRAOCULAR LENS PLACEMENT (IOC);  Surgeon: Baruch Goldmann, MD;  Location: [facility];  Service: Ophthalmology;  Laterality: Left;  CDE: 5.94  ? CATARACT EXTRACTION W/PHACO Right 01/07/2022  ? Procedure: CATARACT EXTRACTION PHACO AND INTRAOCULAR LENS PLACEMENT (IOC);  Surgeon: Baruch Goldmann, MD;  Location: [facility];  Service: Ophthalmology;  Laterality: Right;  CDE: 7.53  ? CHOLECYSTECTOMY  2004  ? at time of colectomy  ? COLONOSCOPY  03/2009  ? anastomotic ulcers and neoterminal ileum ulcers, anastomosis with SB at 30cm. bx s/o Crohn's  ? COLONOSCOPY  2005  ? Dr. Irving Shows: rectal ulcers  ? COLONOSCOPY  2011  ? [facility]  ? COLONOSCOPY N/A 01/30/2016  ? Dr.Rourk- internal hemorrhoids, likely source of hematochezia,  rectal polyps removed s/p subtotal colectomy with normal appearing residual colonic and small bowel mucosa bx= hyperplasticpolyp  ? ESOPHAGOGASTRODUODENOSCOPY  02/2009  ? mild erosive reflux esophagitis  ? ESOPHAGOGASTRODUODENOSCOPY  07/2010  ? probable cervical esophageal web s/p dilation  ? ESOPHAGOGASTRODUODENOSCOPY N/A 01/30/2016  ? Dr.Rourk- normal apearing patent tubular esophagus, hiatal hernia  ? ethmoid mucocele, turbinate surgery  2002  ? right  ? GIVENS CAPSULE STUDY  11/2009  ? Dr. Gala Romney: subtle erytherma and edema of TI  ? HEMORRHOID BANDING  2017  ? Dr.Rourk  ? hysterectomy  2009  ? partial  ? LIVER BIOPSY  2004  ? steatosis, at time of colectomy  ? SUBTOTAL COLECTOMY  2004  ? diverticulitis with abscess  ? UMBILICAL HERNIA REPAIR    ? ? ?OB History   ? ? Gravida  ?2  ? Para  ?2  ? Term  ?2  ? Preterm  ?   ? AB  ?   ? Living  ?2  ?  ? ? SAB  ?   ? IAB  ?   ? Ectopic  ?   ? Multiple  ?   ? Live Births  ?2  ?   ?  ?  ? ? ?Social History  ? ?Socioeconomic History  ? Marital status: Widowed  ?  Spouse name: Not on file  ? Number of children: 2  ?  Years of education: Not on file  ? Highest education level: Not on file  ?Occupational History  ?  Employer: UNEMPLOYED  ?Tobacco Use  ? Smoking status: Former  ?  Packs/day: 0.50  ?  Years: 10.00  ?  Pack years: 5.00  ?  Types: Cigarettes  ?  Quit date: 12/15/2006  ?  Years since quitting: 15.3  ? Smokeless tobacco: Never  ?Vaping Use  ? Vaping Use: Never used  ?Substance and Sexual Activity  ? Alcohol use: Not Currently  ?  Alcohol/week: 0.0 standard drinks  ? Drug use: No  ? Sexual activity: Not Currently  ?  Birth control/protection: Surgical  ?Other Topics Concern  ? Not on file  ?Social History Narrative  ? Not on file  ? ?Social Determinants of Health  ? ?Financial Resource Strain: Medium Risk  ? Difficulty of Paying Living Expenses: Somewhat hard  ?Food Insecurity: Food Insecurity Present  ? Worried About Charity fundraiser in the Last Year: Sometimes true   ? Ran Out of Food in the Last Year: Often true  ?Transportation Needs: No Transportation Needs  ? Lack of Transportation (Medical): No  ? Lack of Transportation (Non-Medical): No  ?Physical Activity: Insufficiently Active  ? Days of Exercise per Week: 2 days  ? Minutes of Exercise per Session: 40 min  ?Stress: Stress Concern Present  ? Feeling of Stress : To some extent  ?Social Connections: Moderately Isolated  ? Frequency of Communication with Friends and Family: Twice a week  ? Frequency of Social Gatherings with Friends and Family: Once a week  ? Attends Religious Services: More than 4 times per year  ? Active Member of Clubs or Organizations: No  ? Attends Archivist Meetings: Never  ? Marital Status: Widowed  ? ? ?Family History  ?Problem Relation Age of Onset  ? ALS Father   ? Crohn's disease Daughter   ? Diabetes Mother   ? Rheum arthritis Mother   ? ? ? ?Current Outpatient Medications:  ?  buPROPion (WELLBUTRIN XL) 150 MG 24 hr tablet, Total of 450 mg daily. Take along with 300 mg tab, Disp: 30 tablet, Rfl: 2 ?  buPROPion (WELLBUTRIN XL) 300 MG 24 hr tablet, Total of 450 mg daily. Take along with 150 mg tab, Disp: 30 tablet, Rfl: 2 ?  Calcium Carb-Cholecalciferol (CALCIUM 600+D3 PO), Take 1 tablet by mouth daily., Disp: , Rfl:  ?  dicyclomine (BENTYL) 10 MG capsule, Take 10 mg by mouth 4 (four) times daily -  before meals and at bedtime., Disp: , Rfl:  ?  gabapentin (NEURONTIN) 300 MG capsule, Take 300 mg by mouth 2 (two) times daily., Disp: , Rfl:  ?  levothyroxine (SYNTHROID, LEVOTHROID) 25 MCG tablet, Take 25 mcg by mouth daily., Disp: , Rfl:  ?  losartan (COZAAR) 25 MG tablet, Take 25 mg by mouth daily., Disp: , Rfl:  ?  metFORMIN (GLUCOPHAGE) 500 MG tablet, Take 500 mg by mouth daily., Disp: , Rfl:  ?  Omega-3 Fatty Acids (FISH OIL PO), Take 2,000 mg by mouth in the morning and at bedtime., Disp: , Rfl:  ?  omeprazole (PRILOSEC) 40 MG capsule, Take 40 mg by mouth daily., Disp: , Rfl:  ?   QUEtiapine (SEROQUEL) 50 MG tablet, Take 50 mg by mouth at bedtime., Disp: , Rfl:  ?  rizatriptan (MAXALT) 10 MG tablet, SMARTSIG:1 Tablet(s) By Mouth 1-2 Times Daily, Disp: , Rfl:  ?  simvastatin (ZOCOR) 10 MG tablet, Take  10 mg by mouth daily., Disp: , Rfl:  ?  spironolactone (ALDACTONE) 50 MG tablet, Take 50 mg by mouth 2 (two) times daily., Disp: , Rfl:  ? ?Review of Systems ? ?Review of Systems  ?Constitutional: Negative for fever, chills, weight loss, malaise/fatigue and diaphoresis.  ?HENT: Negative for hearing loss, ear pain, nosebleeds, congestion, sore throat, neck pain, tinnitus and ear discharge.   ?Eyes: Negative for blurred vision, double vision, photophobia, pain, discharge and redness.  ?Respiratory: Negative for cough, hemoptysis, sputum production, shortness of breath, wheezing and stridor.   ?Cardiovascular: Negative for chest pain, palpitations, orthopnea, claudication, leg swelling and PND.  ?Gastrointestinal: negative for abdominal pain. Negative for heartburn, nausea, vomiting, diarrhea, constipation, blood in stool and melena.  ?Genitourinary: Negative for dysuria, urgency, frequency, hematuria and flank pain.  ?Musculoskeletal: Negative for myalgias, back pain, joint pain and falls.  ?Skin: Negative for itching and rash.  ?Neurological: Negative for dizziness, tingling, tremors, sensory change, speech change, focal weakness, seizures, loss of consciousness, weakness and headaches.  ?Endo/Heme/Allergies: Negative for environmental allergies and polydipsia. Does not bruise/bleed easily.  ?Psychiatric/Behavioral: Negative for depression, suicidal ideas, hallucinations, memory loss and substance abuse. The patient is not nervous/anxious and does not have insomnia.   ? ?  ?  ?Objective:  ?Blood pressure 93/65, pulse 72, height '5\' 3"'$  (1.6 m), weight 205 lb (93 kg).  ? Physical Exam  ?Vitals reviewed. ?Constitutional: She is oriented to person, place, and time. She appears well-developed and  well-nourished.  ?HENT:  ?Head: Normocephalic and atraumatic.        ?Right Ear: External ear normal.  ?Left Ear: External ear normal.  ?Nose: Nose normal.  ?Mouth/Throat: Oropharynx is clear and moist.  ?Eyes: Conj

## 2022-04-25 NOTE — Addendum Note (Signed)
Addended by: Dorita Sciara,  A on: 04/25/2022 02:44 PM ? ? Modules accepted: Orders ? ?

## 2022-05-16 ENCOUNTER — Other Ambulatory Visit: Payer: Self-pay | Admitting: *Deleted

## 2022-05-16 NOTE — Patient Outreach (Signed)
[facility]) Care Management  05/16/2022  [name] 1961-05-28 [id number]   RN Health Coach attempted follow up outreach call to patient.  Patient was unavailable. HIPPA compliance voicemail message left with return callback number.  Plan: RN sent unsuccessful outreach letter to patient RN will call patient again within 30 days.  [facility] Care Management [phone]

## 2022-05-17 DIAGNOSIS — Z885 Allergy status to narcotic agent status: Secondary | ICD-10-CM | POA: Diagnosis not present

## 2022-05-17 DIAGNOSIS — M545 Low back pain, unspecified: Secondary | ICD-10-CM | POA: Diagnosis not present

## 2022-05-17 DIAGNOSIS — X501XXA Overexertion from prolonged static or awkward postures, initial encounter: Secondary | ICD-10-CM | POA: Diagnosis not present

## 2022-05-27 DIAGNOSIS — M62830 Muscle spasm of back: Secondary | ICD-10-CM | POA: Diagnosis not present

## 2022-06-04 ENCOUNTER — Other Ambulatory Visit: Payer: Self-pay | Admitting: *Deleted

## 2022-06-04 NOTE — Patient Outreach (Signed)
[facility]) Care Management  06/04/2022  [name] 02-Jul-1961 [id number]   RN Health Coach closed case. Unable to maintain contact. RN has tried several calls and letter sent with no return calls.  Plan: Closure letter sent to PCP Closure letter sent to Patient  [place] Management [phone]

## 2022-07-01 ENCOUNTER — Ambulatory Visit: Payer: Medicare Other | Admitting: *Deleted

## 2022-07-04 DIAGNOSIS — M62830 Muscle spasm of back: Secondary | ICD-10-CM | POA: Diagnosis not present

## 2022-07-04 DIAGNOSIS — I10 Essential (primary) hypertension: Secondary | ICD-10-CM | POA: Diagnosis not present

## 2022-07-04 DIAGNOSIS — G43919 Migraine, unspecified, intractable, without status migrainosus: Secondary | ICD-10-CM | POA: Diagnosis not present

## 2022-07-04 DIAGNOSIS — E032 Hypothyroidism due to medicaments and other exogenous substances: Secondary | ICD-10-CM | POA: Diagnosis not present

## 2022-07-04 DIAGNOSIS — K219 Gastro-esophageal reflux disease without esophagitis: Secondary | ICD-10-CM | POA: Diagnosis not present

## 2022-07-04 DIAGNOSIS — Z0000 Encounter for general adult medical examination without abnormal findings: Secondary | ICD-10-CM | POA: Diagnosis not present

## 2022-07-04 DIAGNOSIS — E1143 Type 2 diabetes mellitus with diabetic autonomic (poly)neuropathy: Secondary | ICD-10-CM | POA: Diagnosis not present

## 2022-10-10 DIAGNOSIS — Z6838 Body mass index (BMI) 38.0-38.9, adult: Secondary | ICD-10-CM | POA: Diagnosis not present

## 2022-10-10 DIAGNOSIS — I10 Essential (primary) hypertension: Secondary | ICD-10-CM | POA: Diagnosis not present

## 2022-10-10 DIAGNOSIS — E032 Hypothyroidism due to medicaments and other exogenous substances: Secondary | ICD-10-CM | POA: Diagnosis not present

## 2022-10-10 DIAGNOSIS — G43919 Migraine, unspecified, intractable, without status migrainosus: Secondary | ICD-10-CM | POA: Diagnosis not present

## 2022-10-10 DIAGNOSIS — E1143 Type 2 diabetes mellitus with diabetic autonomic (poly)neuropathy: Secondary | ICD-10-CM | POA: Diagnosis not present

## 2022-10-10 DIAGNOSIS — K219 Gastro-esophageal reflux disease without esophagitis: Secondary | ICD-10-CM | POA: Diagnosis not present

## 2022-10-10 DIAGNOSIS — M62830 Muscle spasm of back: Secondary | ICD-10-CM | POA: Diagnosis not present

## 2022-10-11 DIAGNOSIS — I10 Essential (primary) hypertension: Secondary | ICD-10-CM | POA: Diagnosis not present

## 2022-10-11 DIAGNOSIS — E1143 Type 2 diabetes mellitus with diabetic autonomic (poly)neuropathy: Secondary | ICD-10-CM | POA: Diagnosis not present

## 2022-10-11 DIAGNOSIS — E782 Mixed hyperlipidemia: Secondary | ICD-10-CM | POA: Diagnosis not present

## 2022-10-21 DIAGNOSIS — Z1231 Encounter for screening mammogram for malignant neoplasm of breast: Secondary | ICD-10-CM | POA: Diagnosis not present

## 2022-10-30 DIAGNOSIS — N6459 Other signs and symptoms in breast: Secondary | ICD-10-CM | POA: Diagnosis not present

## 2022-10-30 DIAGNOSIS — R928 Other abnormal and inconclusive findings on diagnostic imaging of breast: Secondary | ICD-10-CM | POA: Diagnosis not present

## 2022-10-30 DIAGNOSIS — R922 Inconclusive mammogram: Secondary | ICD-10-CM | POA: Diagnosis not present

## 2023-01-08 DIAGNOSIS — E1143 Type 2 diabetes mellitus with diabetic autonomic (poly)neuropathy: Secondary | ICD-10-CM | POA: Diagnosis not present

## 2023-01-08 DIAGNOSIS — Z0000 Encounter for general adult medical examination without abnormal findings: Secondary | ICD-10-CM | POA: Diagnosis not present

## 2023-01-08 DIAGNOSIS — G43919 Migraine, unspecified, intractable, without status migrainosus: Secondary | ICD-10-CM | POA: Diagnosis not present

## 2023-01-08 DIAGNOSIS — H6121 Impacted cerumen, right ear: Secondary | ICD-10-CM | POA: Diagnosis not present

## 2023-01-08 DIAGNOSIS — K219 Gastro-esophageal reflux disease without esophagitis: Secondary | ICD-10-CM | POA: Diagnosis not present

## 2023-01-08 DIAGNOSIS — E032 Hypothyroidism due to medicaments and other exogenous substances: Secondary | ICD-10-CM | POA: Diagnosis not present

## 2023-01-08 DIAGNOSIS — I10 Essential (primary) hypertension: Secondary | ICD-10-CM | POA: Diagnosis not present

## 2023-01-08 DIAGNOSIS — Z6839 Body mass index (BMI) 39.0-39.9, adult: Secondary | ICD-10-CM | POA: Diagnosis not present

## 2023-03-12 DIAGNOSIS — Z7984 Long term (current) use of oral hypoglycemic drugs: Secondary | ICD-10-CM | POA: Diagnosis not present

## 2023-03-12 DIAGNOSIS — H26493 Other secondary cataract, bilateral: Secondary | ICD-10-CM | POA: Diagnosis not present

## 2023-03-12 DIAGNOSIS — Z961 Presence of intraocular lens: Secondary | ICD-10-CM | POA: Diagnosis not present

## 2023-03-12 DIAGNOSIS — E119 Type 2 diabetes mellitus without complications: Secondary | ICD-10-CM | POA: Diagnosis not present

## 2023-04-01 DIAGNOSIS — E032 Hypothyroidism due to medicaments and other exogenous substances: Secondary | ICD-10-CM | POA: Diagnosis not present

## 2023-04-01 DIAGNOSIS — Z0000 Encounter for general adult medical examination without abnormal findings: Secondary | ICD-10-CM | POA: Diagnosis not present

## 2023-04-01 DIAGNOSIS — K219 Gastro-esophageal reflux disease without esophagitis: Secondary | ICD-10-CM | POA: Diagnosis not present

## 2023-04-01 DIAGNOSIS — G43919 Migraine, unspecified, intractable, without status migrainosus: Secondary | ICD-10-CM | POA: Diagnosis not present

## 2023-04-01 DIAGNOSIS — I10 Essential (primary) hypertension: Secondary | ICD-10-CM | POA: Diagnosis not present

## 2023-04-01 DIAGNOSIS — E038 Other specified hypothyroidism: Secondary | ICD-10-CM | POA: Diagnosis not present

## 2023-04-01 DIAGNOSIS — Z6839 Body mass index (BMI) 39.0-39.9, adult: Secondary | ICD-10-CM | POA: Diagnosis not present

## 2023-04-01 DIAGNOSIS — E1143 Type 2 diabetes mellitus with diabetic autonomic (poly)neuropathy: Secondary | ICD-10-CM | POA: Diagnosis not present

## 2023-04-01 DIAGNOSIS — H6121 Impacted cerumen, right ear: Secondary | ICD-10-CM | POA: Diagnosis not present

## 2023-05-13 DIAGNOSIS — Z885 Allergy status to narcotic agent status: Secondary | ICD-10-CM | POA: Diagnosis not present

## 2023-05-13 DIAGNOSIS — J180 Bronchopneumonia, unspecified organism: Secondary | ICD-10-CM | POA: Diagnosis not present

## 2023-05-13 DIAGNOSIS — E079 Disorder of thyroid, unspecified: Secondary | ICD-10-CM | POA: Diagnosis not present

## 2023-05-13 DIAGNOSIS — E119 Type 2 diabetes mellitus without complications: Secondary | ICD-10-CM | POA: Diagnosis not present

## 2023-05-13 DIAGNOSIS — J189 Pneumonia, unspecified organism: Secondary | ICD-10-CM | POA: Diagnosis not present

## 2023-05-13 DIAGNOSIS — R0602 Shortness of breath: Secondary | ICD-10-CM | POA: Diagnosis not present

## 2023-05-13 DIAGNOSIS — R0789 Other chest pain: Secondary | ICD-10-CM | POA: Diagnosis not present

## 2023-05-13 DIAGNOSIS — R042 Hemoptysis: Secondary | ICD-10-CM | POA: Diagnosis not present

## 2023-05-15 DIAGNOSIS — J180 Bronchopneumonia, unspecified organism: Secondary | ICD-10-CM | POA: Diagnosis not present

## 2023-05-15 DIAGNOSIS — Z6839 Body mass index (BMI) 39.0-39.9, adult: Secondary | ICD-10-CM | POA: Diagnosis not present

## 2023-05-15 DIAGNOSIS — M109 Gout, unspecified: Secondary | ICD-10-CM | POA: Diagnosis not present

## 2023-06-30 DIAGNOSIS — K219 Gastro-esophageal reflux disease without esophagitis: Secondary | ICD-10-CM | POA: Diagnosis not present

## 2023-06-30 DIAGNOSIS — I10 Essential (primary) hypertension: Secondary | ICD-10-CM | POA: Diagnosis not present

## 2023-06-30 DIAGNOSIS — E1143 Type 2 diabetes mellitus with diabetic autonomic (poly)neuropathy: Secondary | ICD-10-CM | POA: Diagnosis not present

## 2023-06-30 DIAGNOSIS — N182 Chronic kidney disease, stage 2 (mild): Secondary | ICD-10-CM | POA: Diagnosis not present

## 2023-06-30 DIAGNOSIS — E038 Other specified hypothyroidism: Secondary | ICD-10-CM | POA: Diagnosis not present

## 2023-06-30 DIAGNOSIS — G43919 Migraine, unspecified, intractable, without status migrainosus: Secondary | ICD-10-CM | POA: Diagnosis not present

## 2023-06-30 DIAGNOSIS — Z6838 Body mass index (BMI) 38.0-38.9, adult: Secondary | ICD-10-CM | POA: Diagnosis not present

## 2023-06-30 DIAGNOSIS — M109 Gout, unspecified: Secondary | ICD-10-CM | POA: Diagnosis not present

## 2023-08-14 DIAGNOSIS — E1143 Type 2 diabetes mellitus with diabetic autonomic (poly)neuropathy: Secondary | ICD-10-CM | POA: Diagnosis not present

## 2023-08-14 DIAGNOSIS — E038 Other specified hypothyroidism: Secondary | ICD-10-CM | POA: Diagnosis not present

## 2023-08-14 DIAGNOSIS — N182 Chronic kidney disease, stage 2 (mild): Secondary | ICD-10-CM | POA: Diagnosis not present

## 2023-08-14 DIAGNOSIS — K219 Gastro-esophageal reflux disease without esophagitis: Secondary | ICD-10-CM | POA: Diagnosis not present

## 2023-08-14 DIAGNOSIS — I10 Essential (primary) hypertension: Secondary | ICD-10-CM | POA: Diagnosis not present

## 2023-08-14 DIAGNOSIS — M109 Gout, unspecified: Secondary | ICD-10-CM | POA: Diagnosis not present

## 2023-08-14 DIAGNOSIS — G43919 Migraine, unspecified, intractable, without status migrainosus: Secondary | ICD-10-CM | POA: Diagnosis not present

## 2023-09-29 DIAGNOSIS — K219 Gastro-esophageal reflux disease without esophagitis: Secondary | ICD-10-CM | POA: Diagnosis not present

## 2023-09-29 DIAGNOSIS — M109 Gout, unspecified: Secondary | ICD-10-CM | POA: Diagnosis not present

## 2023-09-29 DIAGNOSIS — I10 Essential (primary) hypertension: Secondary | ICD-10-CM | POA: Diagnosis not present

## 2023-09-29 DIAGNOSIS — J0191 Acute recurrent sinusitis, unspecified: Secondary | ICD-10-CM | POA: Diagnosis not present

## 2023-09-29 DIAGNOSIS — E1143 Type 2 diabetes mellitus with diabetic autonomic (poly)neuropathy: Secondary | ICD-10-CM | POA: Diagnosis not present

## 2023-09-29 DIAGNOSIS — N182 Chronic kidney disease, stage 2 (mild): Secondary | ICD-10-CM | POA: Diagnosis not present

## 2023-09-29 DIAGNOSIS — G43919 Migraine, unspecified, intractable, without status migrainosus: Secondary | ICD-10-CM | POA: Diagnosis not present

## 2023-09-29 DIAGNOSIS — E038 Other specified hypothyroidism: Secondary | ICD-10-CM | POA: Diagnosis not present

## 2023-09-29 DIAGNOSIS — Z6839 Body mass index (BMI) 39.0-39.9, adult: Secondary | ICD-10-CM | POA: Diagnosis not present

## 2023-10-01 DIAGNOSIS — E669 Obesity, unspecified: Secondary | ICD-10-CM | POA: Diagnosis not present

## 2023-10-01 DIAGNOSIS — Z6839 Body mass index (BMI) 39.0-39.9, adult: Secondary | ICD-10-CM | POA: Diagnosis not present

## 2023-10-01 DIAGNOSIS — R21 Rash and other nonspecific skin eruption: Secondary | ICD-10-CM | POA: Diagnosis not present

## 2023-10-04 ENCOUNTER — Emergency Department (HOSPITAL_COMMUNITY)
Admission: EM | Admit: 2023-10-04 | Discharge: 2023-10-04 | Disposition: A | Discharge status: Home / Self Care | Payer: Medicare HMO | Attending: Emergency Medicine | Admitting: Emergency Medicine

## 2023-10-04 ENCOUNTER — Encounter (HOSPITAL_COMMUNITY): Payer: Self-pay | Admitting: Emergency Medicine

## 2023-10-04 ENCOUNTER — Other Ambulatory Visit: Payer: Self-pay

## 2023-10-04 DIAGNOSIS — L509 Urticaria, unspecified: Secondary | ICD-10-CM | POA: Diagnosis not present

## 2023-10-04 DIAGNOSIS — R748 Abnormal levels of other serum enzymes: Secondary | ICD-10-CM | POA: Insufficient documentation

## 2023-10-04 DIAGNOSIS — R21 Rash and other nonspecific skin eruption: Secondary | ICD-10-CM | POA: Diagnosis present

## 2023-10-04 DIAGNOSIS — E876 Hypokalemia: Secondary | ICD-10-CM | POA: Diagnosis not present

## 2023-10-04 LAB — CBC WITH DIFFERENTIAL/PLATELET
Abs Immature Granulocytes: 0.01 10*3/uL (ref 0.00–0.07)
Basophils Absolute: 0.1 10*3/uL (ref 0.0–0.1)
Basophils Relative: 1 %
Eosinophils Absolute: 0.6 10*3/uL — ABNORMAL HIGH (ref 0.0–0.5)
Eosinophils Relative: 12 %
HCT: 38 % (ref 36.0–46.0)
Hemoglobin: 12.5 g/dL (ref 12.0–15.0)
Immature Granulocytes: 0 %
Lymphocytes Relative: 44 %
Lymphs Abs: 2.1 10*3/uL (ref 0.7–4.0)
MCH: 29.3 pg (ref 26.0–34.0)
MCHC: 32.9 g/dL (ref 30.0–36.0)
MCV: 89 fL (ref 80.0–100.0)
Monocytes Absolute: 0.2 10*3/uL (ref 0.1–1.0)
Monocytes Relative: 5 %
Neutro Abs: 1.9 10*3/uL (ref 1.7–7.7)
Neutrophils Relative %: 38 %
Platelets: 155 10*3/uL (ref 150–400)
RBC: 4.27 MIL/uL (ref 3.87–5.11)
RDW: 13.7 % (ref 11.5–15.5)
WBC Morphology: ABNORMAL
WBC: 4.8 10*3/uL (ref 4.0–10.5)
nRBC: 0 % (ref 0.0–0.2)

## 2023-10-04 LAB — COMPREHENSIVE METABOLIC PANEL
ALT: 143 U/L — ABNORMAL HIGH (ref 0–44)
AST: 80 U/L — ABNORMAL HIGH (ref 15–41)
Albumin: 3.6 g/dL (ref 3.5–5.0)
Alkaline Phosphatase: 89 U/L (ref 38–126)
Anion gap: 8 (ref 5–15)
BUN: 9 mg/dL (ref 8–23)
CO2: 24 mmol/L (ref 22–32)
Calcium: 8.9 mg/dL (ref 8.9–10.3)
Chloride: 104 mmol/L (ref 98–111)
Creatinine, Ser: 1.06 mg/dL — ABNORMAL HIGH (ref 0.44–1.00)
GFR, Estimated: 59 mL/min — ABNORMAL LOW (ref >60)
Glucose, Bld: 143 mg/dL — ABNORMAL HIGH (ref 70–99)
Potassium: 3.3 mmol/L — ABNORMAL LOW (ref 3.5–5.1)
Sodium: 136 mmol/L (ref 135–145)
Total Bilirubin: 0.5 mg/dL (ref 0.3–1.2)
Total Protein: 7.1 g/dL (ref 6.5–8.1)

## 2023-10-04 MED ORDER — PREDNISONE 20 MG PO TABS: ORAL_TABLET | ORAL | 0 refills | Status: DC

## 2023-10-04 MED ORDER — DIPHENHYDRAMINE HCL 25 MG PO CAPS
25.0000 mg | ORAL_CAPSULE | Freq: Once | ORAL | Status: AC
  Administered 2023-10-04: 25 mg via ORAL
  Filled 2023-10-04: qty 1

## 2023-10-04 MED ORDER — DIPHENHYDRAMINE HCL 25 MG PO TABS: 25.0000 mg | ORAL_TABLET | ORAL | 0 refills | Status: AC | PRN

## 2023-10-04 MED ORDER — PREDNISONE 50 MG PO TABS
60.0000 mg | ORAL_TABLET | Freq: Once | ORAL | Status: AC
  Administered 2023-10-04: 60 mg via ORAL
  Filled 2023-10-04: qty 1

## 2023-10-04 NOTE — ED Triage Notes (Signed)
Pt started on new medication for sinus infection on Tuesday and she started to develop a rash and was itching. Pt followed up with Urgent Care and was told to stop taking that medication and they started her on hydrocortisone cream. Pt states she is still itching and that she has a rash "all over her body".

## 2023-10-04 NOTE — Discharge Instructions (Addendum)
Abnormal labs to have primary doctor follow up Potassium 3.3 AST: 80 ALT: 143

## 2023-10-04 NOTE — ED Provider Notes (Signed)
[place] EMERGENCY DEPARTMENT AT [facility] Provider Note   CSN: [id number] Arrival date & time: 10/04/23  0101     History  Chief Complaint  Patient presents with   Allergic Reaction    [name] is a 62 y.o. female.  Pruritic rash worsening over last few days with hydrocortisone cream. Thought it was related to new abx started Monday, however last dose was on Tuesday and states rash has continued to spread to torso and arms. Initially around neck. Hasn't tried benadryl. No oral lesions. Denies sob, vomiting, diarrhea, light headedness or syncope. No other new allergens at home.    Allergic Reaction      Home Medications Prior to Admission medications   Medication Sig Start Date End Date Taking? Authorizing Provider  diphenhydrAMINE (BENADRYL) 25 MG tablet Take 1 tablet (25 mg total) by mouth every 4 (four) hours as needed for up to 7 days for itching. 10/04/23 10/11/23 Yes Pearly Bartosik, Barbara Cower, MD  predniSONE (DELTASONE) 20 MG tablet 3 tabs po daily x 3 days, then 2 tabs x 3 days, then 1.5 tabs x 3 days, then 1 tab x 3 days, then 0.5 tabs x 3 days 10/05/23  Yes Amadea Keagy, Barbara Cower, MD  buPROPion (WELLBUTRIN XL) 150 MG 24 hr tablet Total of 450 mg daily. Take along with 300 mg tab 07/04/21   Neysa Hotter, MD  buPROPion (WELLBUTRIN XL) 300 MG 24 hr tablet Total of 450 mg daily. Take along with 150 mg tab 07/13/21   Neysa Hotter, MD  Calcium Carb-Cholecalciferol (CALCIUM 600+D3 PO) Take 1 tablet by mouth daily.    [provider]  dicyclomine (BENTYL) 10 MG capsule Take 10 mg by mouth 4 (four) times daily -  before meals and at bedtime.    [provider]  gabapentin (NEURONTIN) 300 MG capsule Take 300 mg by mouth 2 (two) times daily.    [provider]  levothyroxine (SYNTHROID, LEVOTHROID) 25 MCG tablet Take 25 mcg by mouth daily. 07/05/16   [provider]  losartan (COZAAR) 25 MG tablet Take 25 mg by mouth daily. 04/16/22   [provider]  metFORMIN (GLUCOPHAGE) 500 MG tablet Take 500 mg by mouth daily.    [provider]  Omega-3 Fatty Acids (FISH OIL PO) Take 2,000 mg by mouth in the morning and at bedtime.    [provider]  omeprazole (PRILOSEC) 40 MG capsule Take 40 mg by mouth daily. 12/12/20   [provider]  QUEtiapine (SEROQUEL) 50 MG tablet Take 50 mg by mouth at bedtime.    [provider]  rizatriptan (MAXALT) 10 MG tablet SMARTSIG:1 Tablet(s) By Mouth 1-2 Times Daily 01/03/22   [provider]  simvastatin (ZOCOR) 10 MG tablet Take 10 mg by mouth daily. 07/05/16   [provider]  spironolactone (ALDACTONE) 50 MG tablet Take 50 mg by mouth 2 (two) times daily.    [provider]      Allergies    Hydromorphone hcl    Review of Systems   Review of Systems  Physical Exam Updated Vital Signs BP 101/62   Pulse 79   Temp 98 F (36.7 C)   Resp 18   Ht 5\' 3"  (1.6 m)   Wt 94.3 kg   SpO2 100%   BMI 36.85 kg/m  Physical Exam Vitals and nursing note reviewed.  Constitutional:      Appearance: She is well-developed.  HENT:     Head: Normocephalic and atraumatic.  Mouth/Throat:     Mouth: Mucous membranes are moist.  Cardiovascular:     Rate and Rhythm: Normal rate and regular rhythm.  Pulmonary:     Effort: No respiratory distress.     Breath sounds: No stridor.  Abdominal:     General: There is no distension.  Musculoskeletal:     Cervical back: Normal range of motion.  Skin:    Comments: Urticarial appearing rash with excoriations on upper chest and back. Erythematous, warm rash with small papules on bilateral forearms and lower legs.   Neurological:     Mental Status: She is alert.     ED Results / Procedures / Treatments   Labs (all labs ordered are listed, but only abnormal results are displayed) Labs Reviewed  CBC WITH DIFFERENTIAL/PLATELET - Abnormal; Notable for the following components:      Result Value    Eosinophils Absolute 0.6 (*)    All other components within normal limits  COMPREHENSIVE METABOLIC PANEL - Abnormal; Notable for the following components:   Potassium 3.3 (*)    Glucose, Bld 143 (*)    Creatinine, Ser 1.06 (*)    AST 80 (*)    ALT 143 (*)    GFR, Estimated 59 (*)    All other components within normal limits    EKG None  Radiology No results found.  Procedures Procedures    Medications Ordered in ED Medications  diphenhydrAMINE (BENADRYL) capsule 25 mg (25 mg Oral Given 10/04/23 0223)  predniSONE (DELTASONE) tablet 60 mg (60 mg Oral Given 10/04/23 1610)    ED Course/ Medical Decision Making/ A&P                                 Medical Decision Making Amount and/or Complexity of Data Reviewed Labs: ordered.  Risk OTC drugs. Prescription drug management.   No e/o anaphylaxis. Seems to be an immune mediated rash. No obvious contraindications to benadryl, will try that and check cbc/cmp to ensure no elevated bili, k, BUN, anemia to suggest those as cause for rash. If ressuring, will discuss oral steroids. Allergist follow up. Mild transaminitis, could be related to recent illness but doubt it is causing pruritus with normal bilirubin. No uremia. No anemia. Slight hypokalemia, no need for repletion, will fu w/ pcp for recheck of both. Benadryl helped with itching, will initiate prednisone and refer to allergist.     Final Clinical Impression(s) / ED Diagnoses Final diagnoses:  Urticaria  Hypokalemia  Elevated liver enzymes    Rx / DC Orders ED Discharge Orders          Ordered    predniSONE (DELTASONE) 20 MG tablet        10/04/23 0329    diphenhydrAMINE (BENADRYL) 25 MG tablet  Every 4 hours PRN        10/04/23 0329    Ambulatory referral to Allergy        10/04/23 0331              Zosia Lucchese, Barbara Cower, MD 10/04/23 0345

## 2023-10-04 NOTE — ED Notes (Signed)
Lab at bedside

## 2023-10-07 DIAGNOSIS — L509 Urticaria, unspecified: Secondary | ICD-10-CM | POA: Diagnosis not present

## 2023-10-07 DIAGNOSIS — I10 Essential (primary) hypertension: Secondary | ICD-10-CM | POA: Diagnosis not present

## 2023-10-07 DIAGNOSIS — Z6839 Body mass index (BMI) 39.0-39.9, adult: Secondary | ICD-10-CM | POA: Diagnosis not present

## 2023-10-07 DIAGNOSIS — N1831 Chronic kidney disease, stage 3a: Secondary | ICD-10-CM | POA: Diagnosis not present

## 2023-10-27 DIAGNOSIS — I10 Essential (primary) hypertension: Secondary | ICD-10-CM | POA: Diagnosis not present

## 2023-10-27 DIAGNOSIS — E1143 Type 2 diabetes mellitus with diabetic autonomic (poly)neuropathy: Secondary | ICD-10-CM | POA: Diagnosis not present

## 2023-11-03 DIAGNOSIS — Z1231 Encounter for screening mammogram for malignant neoplasm of breast: Secondary | ICD-10-CM | POA: Diagnosis not present

## 2023-12-30 DIAGNOSIS — G43919 Migraine, unspecified, intractable, without status migrainosus: Secondary | ICD-10-CM | POA: Diagnosis not present

## 2023-12-30 DIAGNOSIS — I10 Essential (primary) hypertension: Secondary | ICD-10-CM | POA: Diagnosis not present

## 2023-12-30 DIAGNOSIS — E1343 Other specified diabetes mellitus with diabetic autonomic (poly)neuropathy: Secondary | ICD-10-CM | POA: Diagnosis not present

## 2023-12-30 DIAGNOSIS — E038 Other specified hypothyroidism: Secondary | ICD-10-CM | POA: Diagnosis not present

## 2023-12-30 DIAGNOSIS — K219 Gastro-esophageal reflux disease without esophagitis: Secondary | ICD-10-CM | POA: Diagnosis not present

## 2023-12-30 DIAGNOSIS — Z6841 Body Mass Index (BMI) 40.0 and over, adult: Secondary | ICD-10-CM | POA: Diagnosis not present

## 2023-12-30 DIAGNOSIS — E1122 Type 2 diabetes mellitus with diabetic chronic kidney disease: Secondary | ICD-10-CM | POA: Diagnosis not present

## 2023-12-30 DIAGNOSIS — M109 Gout, unspecified: Secondary | ICD-10-CM | POA: Diagnosis not present

## 2023-12-30 DIAGNOSIS — N1831 Chronic kidney disease, stage 3a: Secondary | ICD-10-CM | POA: Diagnosis not present

## 2024-01-28 DIAGNOSIS — Z961 Presence of intraocular lens: Secondary | ICD-10-CM | POA: Diagnosis not present

## 2024-01-28 DIAGNOSIS — E119 Type 2 diabetes mellitus without complications: Secondary | ICD-10-CM | POA: Diagnosis not present

## 2024-01-28 DIAGNOSIS — H26493 Other secondary cataract, bilateral: Secondary | ICD-10-CM | POA: Diagnosis not present

## 2024-02-24 ENCOUNTER — Telehealth: Payer: Self-pay | Admitting: *Deleted

## 2024-02-24 DIAGNOSIS — F320 Major depressive disorder, single episode, mild: Secondary | ICD-10-CM

## 2024-02-24 NOTE — Patient Outreach (Signed)
 [id number] Patient called regarding concerns of depression  Referred to SW   Gean Maidens BSN RN [facility] Health Care Management Coordinator [email or web address] Direct Dial: [phone]  Fax: [phone] Website: [email or web address]

## 2024-02-26 ENCOUNTER — Telehealth: Payer: Self-pay | Admitting: *Deleted

## 2024-02-26 DIAGNOSIS — H26492 Other secondary cataract, left eye: Secondary | ICD-10-CM | POA: Diagnosis not present

## 2024-02-26 NOTE — Progress Notes (Signed)
 Complex Care Management Note Care Guide Note  02/26/2024 Name: [name] MRN: [id number] DOB: 01/13/1961   Complex Care Management Outreach Attempts: An unsuccessful telephone outreach was attempted today to offer the patient information about available complex care management services.  Follow Up Plan:  Additional outreach attempts will be made to offer the patient complex care management information and services.   Encounter Outcome:  No Answer  Gwenevere Ghazi  [facility]  [facility], [facility] Guide  Direct Dial: [phone]  Fax [phone]

## 2024-02-27 NOTE — Progress Notes (Signed)
 Complex Care Management Note Care Guide Note  02/27/2024 Name: [name] MRN: [id number] DOB: Feb 18, 1961   Complex Care Management Outreach Attempts: A second unsuccessful outreach was attempted today to offer the patient with information about available complex care management services.  Follow Up Plan:  Additional outreach attempts will be made to offer the patient complex care management information and services.   Encounter Outcome:  No Answer  Gwenevere Ghazi  [facility]  [facility], [facility] Guide  Direct Dial: [phone]  Fax [phone]

## 2024-03-01 NOTE — Progress Notes (Signed)
 Complex Care Management Note Care Guide Note  03/01/2024 Name: KAMYLA OLEJNIK MRN: [id number] DOB: 1961/06/22   Complex Care Management Outreach Attempts: A third unsuccessful outreach was attempted today to offer the patient with information about available complex care management services.  Follow Up Plan:  No further outreach attempts will be made at this time. We have been unable to contact the patient to offer or enroll patient in complex care management services.  Encounter Outcome:  No Answer  Gwenevere Ghazi  [facility] Health  [facility], [facility] Guide  Direct Dial: [phone]  Fax [phone]

## 2024-03-22 DIAGNOSIS — H26491 Other secondary cataract, right eye: Secondary | ICD-10-CM | POA: Diagnosis not present

## 2024-03-30 DIAGNOSIS — K219 Gastro-esophageal reflux disease without esophagitis: Secondary | ICD-10-CM | POA: Diagnosis not present

## 2024-03-30 DIAGNOSIS — E1121 Type 2 diabetes mellitus with diabetic nephropathy: Secondary | ICD-10-CM | POA: Diagnosis not present

## 2024-03-30 DIAGNOSIS — I10 Essential (primary) hypertension: Secondary | ICD-10-CM | POA: Diagnosis not present

## 2024-03-30 DIAGNOSIS — E038 Other specified hypothyroidism: Secondary | ICD-10-CM | POA: Diagnosis not present

## 2024-03-30 DIAGNOSIS — E1122 Type 2 diabetes mellitus with diabetic chronic kidney disease: Secondary | ICD-10-CM | POA: Diagnosis not present

## 2024-03-30 DIAGNOSIS — Z0000 Encounter for general adult medical examination without abnormal findings: Secondary | ICD-10-CM | POA: Diagnosis not present

## 2024-03-30 DIAGNOSIS — N1831 Chronic kidney disease, stage 3a: Secondary | ICD-10-CM | POA: Diagnosis not present

## 2024-03-30 DIAGNOSIS — M109 Gout, unspecified: Secondary | ICD-10-CM | POA: Diagnosis not present

## 2024-03-30 DIAGNOSIS — G43919 Migraine, unspecified, intractable, without status migrainosus: Secondary | ICD-10-CM | POA: Diagnosis not present

## 2024-05-06 DIAGNOSIS — H9313 Tinnitus, bilateral: Secondary | ICD-10-CM | POA: Diagnosis not present

## 2024-05-06 DIAGNOSIS — H903 Sensorineural hearing loss, bilateral: Secondary | ICD-10-CM | POA: Diagnosis not present

## 2024-05-27 ENCOUNTER — Emergency Department (HOSPITAL_COMMUNITY)

## 2024-05-27 ENCOUNTER — Other Ambulatory Visit: Payer: Self-pay

## 2024-05-27 ENCOUNTER — Encounter (HOSPITAL_COMMUNITY): Payer: Self-pay

## 2024-05-27 ENCOUNTER — Emergency Department (HOSPITAL_COMMUNITY)
Admission: EM | Admit: 2024-05-27 | Discharge: 2024-05-27 | Disposition: A | Discharge status: Home / Self Care | Attending: Emergency Medicine | Admitting: Emergency Medicine

## 2024-05-27 DIAGNOSIS — E039 Hypothyroidism, unspecified: Secondary | ICD-10-CM | POA: Insufficient documentation

## 2024-05-27 DIAGNOSIS — I10 Essential (primary) hypertension: Secondary | ICD-10-CM | POA: Insufficient documentation

## 2024-05-27 DIAGNOSIS — J45909 Unspecified asthma, uncomplicated: Secondary | ICD-10-CM | POA: Insufficient documentation

## 2024-05-27 DIAGNOSIS — I6782 Cerebral ischemia: Secondary | ICD-10-CM | POA: Diagnosis not present

## 2024-05-27 DIAGNOSIS — R42 Dizziness and giddiness: Secondary | ICD-10-CM | POA: Insufficient documentation

## 2024-05-27 DIAGNOSIS — Z79899 Other long term (current) drug therapy: Secondary | ICD-10-CM | POA: Insufficient documentation

## 2024-05-27 LAB — BASIC METABOLIC PANEL WITH GFR
Anion gap: 10 (ref 5–15)
BUN: 15 mg/dL (ref 8–23)
CO2: 23 mmol/L (ref 22–32)
Calcium: 9.1 mg/dL (ref 8.9–10.3)
Chloride: 107 mmol/L (ref 98–111)
Creatinine, Ser: 1.04 mg/dL — ABNORMAL HIGH (ref 0.44–1.00)
GFR, Estimated: >60 mL/min (ref >60)
Glucose, Bld: 117 mg/dL — ABNORMAL HIGH (ref 70–99)
Potassium: 4 mmol/L (ref 3.5–5.1)
Sodium: 140 mmol/L (ref 135–145)

## 2024-05-27 LAB — CBC
HCT: 37.9 % (ref 36.0–46.0)
Hemoglobin: 12.8 g/dL (ref 12.0–15.0)
MCH: 30.7 pg (ref 26.0–34.0)
MCHC: 33.8 g/dL (ref 30.0–36.0)
MCV: 90.9 fL (ref 80.0–100.0)
Platelets: 188 10*3/uL (ref 150–400)
RBC: 4.17 MIL/uL (ref 3.87–5.11)
RDW: 14.1 % (ref 11.5–15.5)
WBC: 5.7 10*3/uL (ref 4.0–10.5)
nRBC: 0 % (ref 0.0–0.2)

## 2024-05-27 MED ORDER — MECLIZINE HCL 12.5 MG PO TABS
25.0000 mg | ORAL_TABLET | Freq: Once | ORAL | Status: AC
  Administered 2024-05-27: 25 mg via ORAL
  Filled 2024-05-27: qty 2

## 2024-05-27 MED ORDER — ONDANSETRON HCL 4 MG/2ML IJ SOLN
4.0000 mg | Freq: Once | INTRAMUSCULAR | Status: AC
  Administered 2024-05-27: 4 mg via INTRAVENOUS
  Filled 2024-05-27: qty 2

## 2024-05-27 MED ORDER — LORAZEPAM 2 MG/ML IJ SOLN
0.5000 mg | Freq: Once | INTRAMUSCULAR | Status: AC
  Administered 2024-05-27: 0.5 mg via INTRAVENOUS
  Filled 2024-05-27: qty 1

## 2024-05-27 MED ORDER — SODIUM CHLORIDE 0.9 % IV BOLUS
1000.0000 mL | Freq: Once | INTRAVENOUS | Status: AC
  Administered 2024-05-27: 1000 mL via INTRAVENOUS

## 2024-05-27 MED ORDER — MECLIZINE HCL 25 MG PO TABS: 25.0000 mg | ORAL_TABLET | Freq: Three times a day (TID) | ORAL | 0 refills | Status: AC | PRN

## 2024-05-27 NOTE — Discharge Instructions (Signed)
 Thankfully your testing does not show any signs of stroke or other brain abnormalities.  You can follow-up with your family doctor.  Please do not drive if you are having excessive dizziness.  A medication called meclizine has been sent to your pharmacy, you can take 1 tablet every 8 hours as needed for dizziness  Meclizine is a medication that can help with vertigo and dizziness.  You may take 1 tablet every 6 hours as needed.  Please make sure that you do not drive when you are taking this medication as it can make you sleepy.  Thank you for allowing us  to treat you in the emergency department today.  After reviewing your examination and potential testing that was done it appears that you are safe to go home.  I would like for you to follow-up with your doctor within the next several days, have them obtain your records and follow-up with them to review all potential tests and results from your visit.  If you should develop severe or worsening symptoms return to the emergency department immediately

## 2024-05-27 NOTE — ED Triage Notes (Signed)
 Pt reports dizziness that started today when changing positions, pt reports room spinning, worse when trying to lay down

## 2024-05-27 NOTE — ED Notes (Signed)
 Pt back from MRI @ [phone]

## 2024-05-27 NOTE — ED Notes (Signed)
 Pt unsteady throughout walk to restroom, had to hold to this RN or other objects during her walk. Endorsed dizziness throughout

## 2024-05-27 NOTE — ED Notes (Signed)
 Pt went to MRI @ 8 am.

## 2024-05-27 NOTE — ED Provider Notes (Signed)
 [place] EMERGENCY DEPARTMENT AT [facility] Provider Note   CSN: [id number] Arrival date & time: 05/27/24  0048     History  Chief Complaint  Patient presents with   Dizziness    [name] is a 63 y.o. female.  The history is provided by the patient.  Patient with history of hypertension, depression, chronic back pain presents for dizziness.  Patient reports yesterday morning she was at work when she bent down had abrupt onset of spinning/vertigo type symptoms.  No falls or trauma.  No syncope.  Since that time she has had intermittent episodes of dizziness with change in position.  No fevers or vomiting.  She does report a mild headache at this time that is gradually worsening.  No focal visual loss.  No arm or leg weakness.  No chest pain or shortness of breath.  No slurred speech No previous history of CVA. Patient reports recent history of hearing loss has been seen by otolaryngology    Past Medical History:  Diagnosis Date   Anxiety    Asthma    Chronic back pain    Depression    Diverticulitis    complicated by abscess requiring total colectomy and take-down of colostomy in 2004   GERD (gastroesophageal reflux disease)    Hemorrhoids    Hiatal hernia    HTN (hypertension)    Hypothyroidism    IBS (irritable bowel syndrome)    Migraine    Neck pain, chronic    Obesity    Polyp of colon, hyperplastic    [place] spotted fever    Terminal ileitis of small intestine (HCC)    referred to [facility], Crohn's ruled out   Thyroid  disease     Home Medications Prior to Admission medications   Medication Sig Start Date End Date Taking? Authorizing Provider  buPROPion  (WELLBUTRIN  XL) 150 MG 24 hr tablet Total of 450 mg daily. Take along with 300 mg tab 07/04/21   Todd Fossa, MD  buPROPion  (WELLBUTRIN  XL) 300 MG 24 hr tablet Total of 450 mg daily. Take along with 150 mg tab 07/13/21   Todd Fossa, MD  Calcium Carb-Cholecalciferol (CALCIUM 600+D3  PO) Take 1 tablet by mouth daily.    [provider]  dicyclomine  (BENTYL ) 10 MG capsule Take 10 mg by mouth 4 (four) times daily -  before meals and at bedtime.    [provider]  diphenhydrAMINE  (BENADRYL ) 25 MG tablet Take 1 tablet (25 mg total) by mouth every 4 (four) hours as needed for up to 7 days for itching. 10/04/23 10/11/23  Mesner, Reymundo Caulk, MD  gabapentin  (NEURONTIN ) 300 MG capsule Take 300 mg by mouth 2 (two) times daily.    [provider]  levothyroxine  (SYNTHROID , LEVOTHROID) 25 MCG tablet Take 25 mcg by mouth daily. 07/05/16   [provider]  losartan (COZAAR) 25 MG tablet Take 25 mg by mouth daily. 04/16/22   [provider]  metFORMIN (GLUCOPHAGE) 500 MG tablet Take 500 mg by mouth daily.    [provider]  Omega-3 Fatty Acids (FISH OIL PO) Take 2,000 mg by mouth in the morning and at bedtime.    [provider]  omeprazole  (PRILOSEC) 40 MG capsule Take 40 mg by mouth daily. 12/12/20   [provider]  QUEtiapine  (SEROQUEL ) 50 MG tablet Take 50 mg by mouth at bedtime.    [provider]  rizatriptan (MAXALT) 10 MG tablet SMARTSIG:1 Tablet(s) By Mouth 1-2 Times Daily 01/03/22  [provider]  simvastatin (ZOCOR) 10 MG tablet Take 10 mg by mouth daily. 07/05/16   [provider]  spironolactone (ALDACTONE) 50 MG tablet Take 50 mg by mouth 2 (two) times daily.    [provider]      Allergies    Hydromorphone hcl    Review of Systems   Review of Systems  Constitutional:  Negative for fever.  Respiratory:  Negative for shortness of breath.   Cardiovascular:  Negative for chest pain.  Neurological:  Positive for dizziness and headaches. Negative for syncope, facial asymmetry and speech difficulty.    Physical Exam Updated Vital Signs BP 98/73   Pulse 65   Temp 98 F (36.7 C)   Resp 13   Ht 1.6 m (5' 3)   Wt 94.3 kg   SpO2 94%   BMI 36.83 kg/m  Physical  Exam CONSTITUTIONAL: Well developed/well nourished HEAD: Normocephalic/atraumatic EYES: EOMI/PERRL, no nystagmus, no ptosis ENMT: Mucous membranes moist NECK: supple no meningeal signs, no bruits CV: S1/S2 noted, no murmurs/rubs/gallops noted LUNGS: Lungs are clear to auscultation bilaterally, no apparent distress NEURO:Awake/alert, face symmetric, no arm or leg drift is noted Equal 5/5 strength with shoulder abduction, elbow flex/extension, wrist flex/extension in upper extremities and equal hand grips bilaterally Equal 5/5 strength with hip flexion,knee flex/extension, foot dorsi/plantar flexion Cranial nerves 3/4/5/6/06/23/09/11/12 tested and intact Patient able to stand, but reports dizziness upon standing No past pointing Sensation to light touch intact in all extremities EXTREMITIES: pulses normal, full ROM  ED Results / Procedures / Treatments   Labs (all labs ordered are listed, but only abnormal results are displayed) Labs Reviewed  BASIC METABOLIC PANEL WITH GFR - Abnormal; Notable for the following components:      Result Value   Glucose, Bld 117 (*)    Creatinine, Ser 1.04 (*)    All other components within normal limits  CBC    EKG EKG Interpretation Date/Time:  Thursday May 27 2024 02:07:40 EDT Ventricular Rate:  64 PR Interval:  161 QRS Duration:  146 QT Interval:  462 QTC Calculation: 477 R Axis:   7  Text Interpretation: Sinus rhythm Right bundle branch block Probable left ventricular hypertrophy No significant change since last tracing Confirmed by Eldon Greenland ([id number]) on 05/27/2024 3:08:25 AM  Radiology No results found.  Procedures Procedures    Medications Ordered in ED Medications  LORazepam (ATIVAN) injection 0.5 mg (has no administration in time range)  meclizine (ANTIVERT) tablet 25 mg (25 mg Oral Given 05/27/24 0339)  sodium chloride  0.9 % bolus 1,000 mL (0 mLs Intravenous Stopped 05/27/24 0555)  ondansetron  (ZOFRAN ) injection 4 mg (4 mg  Intravenous Given 05/27/24 0452)    ED Course/ Medical Decision Making/ A&P Clinical Course as of 05/27/24 0646  Thu May 27, 2024  0449 Patient presents with abrupt onset of vertigo yesterday morning.  It appears to be positional.  No focal neurodeficits, but does have difficulty walking after standing up Will treat symptoms and reassess.  [DW]  [phone] Patient with minimal improvement with medications. She stood up to ambulate but did lean into the nurse.  No falls. Patient is a poor historian cannot describe what she is feeling beyond dizzy  Given her underlying health conditions, will proceed with MRI brain  [DW]  [phone] Signed out to dr Annabell Key at shift change to f/u on MRI brain [DW]    Clinical Course User Index [DW] Eldon Greenland, MD  Medical Decision Making Amount and/or Complexity of Data Reviewed Labs: ordered. Radiology: ordered. ECG/medicine tests: ordered.  Risk Prescription drug management.   This patient presents to the ED for concern of dizziness, this involves an extensive number of treatment options, and is a complaint that carries with it a high risk of complications and morbidity.  The differential diagnosis includes but is not limited to CVA, intracranial hemorrhage, acute coronary syndrome, renal failure, urinary tract infection, electrolyte disturbance, pneumonia   Comorbidities that complicate the patient evaluation: Patient's presentation is complicated by their history of hypertension  Social Determinants of Health: Patient's previous tobacco use  increases the complexity of managing their presentation  Additional history obtained: Records reviewed previous records reviewed  Lab Tests: I Ordered, and personally interpreted labs.  The pertinent results include: Labs reassuring  Imaging Studies ordered: I ordered imaging studies including MRI brain    Cardiac Monitoring: The patient was maintained on a cardiac  monitor.  I personally viewed and interpreted the cardiac monitor which showed an underlying rhythm of:  sinus rhythm  Medicines ordered and prescription drug management: I ordered medication including Antivert for vertigo Reevaluation of the patient after these medicines showed that the patient    stayed the same   Critical Interventions:  IV fluids  Reevaluation: After the interventions noted above, I reevaluated the patient and found that they have :stayed the same  Complexity of problems addressed: Patient's presentation is most consistent with  acute presentation with potential threat to life or bodily function            Final Clinical Impression(s) / ED Diagnoses Final diagnoses:  Vertigo    Rx / DC Orders ED Discharge Orders     None         Eldon Greenland, MD 05/27/24 [phone]

## 2024-05-27 NOTE — ED Provider Notes (Signed)
 Care accepted change of shift, this patient has a negative MRI for any acute ischemic findings or posterior abnormalities.  This patient is stable for discharge, vital signs unremarkable   Early Glisson, MD 05/27/24 [phone]

## 2024-06-29 DIAGNOSIS — K219 Gastro-esophageal reflux disease without esophagitis: Secondary | ICD-10-CM | POA: Diagnosis not present

## 2024-06-29 DIAGNOSIS — Z0000 Encounter for general adult medical examination without abnormal findings: Secondary | ICD-10-CM | POA: Diagnosis not present

## 2024-06-29 DIAGNOSIS — E038 Other specified hypothyroidism: Secondary | ICD-10-CM | POA: Diagnosis not present

## 2024-06-29 DIAGNOSIS — N182 Chronic kidney disease, stage 2 (mild): Secondary | ICD-10-CM | POA: Diagnosis not present

## 2024-06-29 DIAGNOSIS — E1122 Type 2 diabetes mellitus with diabetic chronic kidney disease: Secondary | ICD-10-CM | POA: Diagnosis not present

## 2024-06-29 DIAGNOSIS — I10 Essential (primary) hypertension: Secondary | ICD-10-CM | POA: Diagnosis not present

## 2024-06-29 DIAGNOSIS — G43919 Migraine, unspecified, intractable, without status migrainosus: Secondary | ICD-10-CM | POA: Diagnosis not present

## 2024-06-29 DIAGNOSIS — M109 Gout, unspecified: Secondary | ICD-10-CM | POA: Diagnosis not present

## 2024-07-22 DIAGNOSIS — Z13820 Encounter for screening for osteoporosis: Secondary | ICD-10-CM | POA: Diagnosis not present

## 2024-07-22 DIAGNOSIS — M8588 Other specified disorders of bone density and structure, other site: Secondary | ICD-10-CM | POA: Diagnosis not present

## 2024-07-22 DIAGNOSIS — M810 Age-related osteoporosis without current pathological fracture: Secondary | ICD-10-CM | POA: Diagnosis not present

## 2024-07-22 DIAGNOSIS — Z780 Asymptomatic menopausal state: Secondary | ICD-10-CM | POA: Diagnosis not present

## 2024-09-06 DIAGNOSIS — J40 Bronchitis, not specified as acute or chronic: Secondary | ICD-10-CM | POA: Diagnosis not present

## 2024-09-30 DIAGNOSIS — G43919 Migraine, unspecified, intractable, without status migrainosus: Secondary | ICD-10-CM | POA: Diagnosis not present

## 2024-09-30 DIAGNOSIS — M109 Gout, unspecified: Secondary | ICD-10-CM | POA: Diagnosis not present

## 2024-09-30 DIAGNOSIS — I10 Essential (primary) hypertension: Secondary | ICD-10-CM | POA: Diagnosis not present

## 2024-09-30 DIAGNOSIS — N182 Chronic kidney disease, stage 2 (mild): Secondary | ICD-10-CM | POA: Diagnosis not present

## 2024-09-30 DIAGNOSIS — E038 Other specified hypothyroidism: Secondary | ICD-10-CM | POA: Diagnosis not present

## 2024-09-30 DIAGNOSIS — K219 Gastro-esophageal reflux disease without esophagitis: Secondary | ICD-10-CM | POA: Diagnosis not present

## 2024-09-30 DIAGNOSIS — E1122 Type 2 diabetes mellitus with diabetic chronic kidney disease: Secondary | ICD-10-CM | POA: Diagnosis not present
